# Patient Record
Sex: Female | Born: 1985 | Hispanic: Yes | Marital: Single | Smoking: Former smoker
Health system: Southern US, Community
[De-identification: ages and names within clinical notes are randomized; demographics above are authoritative.]

## PROBLEM LIST (undated history)

## (undated) ENCOUNTER — Inpatient Hospital Stay (HOSPITAL_COMMUNITY): Payer: Self-pay

## (undated) DIAGNOSIS — F329 Major depressive disorder, single episode, unspecified: Secondary | ICD-10-CM

## (undated) DIAGNOSIS — K089 Disorder of teeth and supporting structures, unspecified: Secondary | ICD-10-CM

## (undated) DIAGNOSIS — O24419 Gestational diabetes mellitus in pregnancy, unspecified control: Secondary | ICD-10-CM

## (undated) DIAGNOSIS — E119 Type 2 diabetes mellitus without complications: Secondary | ICD-10-CM

## (undated) DIAGNOSIS — F32A Depression, unspecified: Secondary | ICD-10-CM

## (undated) HISTORY — DX: Depression, unspecified: F32.A

## (undated) HISTORY — PX: NO PAST SURGERIES: SHX2092

## (undated) HISTORY — DX: Major depressive disorder, single episode, unspecified: F32.9

---

## 2006-09-29 ENCOUNTER — Encounter (INDEPENDENT_AMBULATORY_CARE_PROVIDER_SITE_OTHER): Payer: Self-pay | Admitting: Nurse Practitioner

## 2006-09-29 LAB — CONVERTED CEMR LAB: Pap Smear: NORMAL

## 2006-10-17 ENCOUNTER — Ambulatory Visit: Payer: Self-pay | Admitting: Internal Medicine

## 2006-10-17 ENCOUNTER — Encounter: Payer: Self-pay | Admitting: Internal Medicine

## 2006-10-17 ENCOUNTER — Encounter (INDEPENDENT_AMBULATORY_CARE_PROVIDER_SITE_OTHER): Payer: Self-pay | Admitting: Nurse Practitioner

## 2006-10-17 ENCOUNTER — Other Ambulatory Visit: Admission: RE | Admit: 2006-10-17 | Discharge: 2006-10-17 | Payer: Self-pay | Admitting: Internal Medicine

## 2006-10-17 LAB — CONVERTED CEMR LAB: Pap Smear: NEGATIVE

## 2006-10-18 ENCOUNTER — Encounter (INDEPENDENT_AMBULATORY_CARE_PROVIDER_SITE_OTHER): Payer: Self-pay | Admitting: Nurse Practitioner

## 2006-10-18 LAB — CONVERTED CEMR LAB
Chlamydia, DNA Probe: NEGATIVE
GC Probe Amp, Genital: NEGATIVE

## 2007-01-28 ENCOUNTER — Ambulatory Visit: Payer: Self-pay | Admitting: Internal Medicine

## 2007-01-28 ENCOUNTER — Encounter (INDEPENDENT_AMBULATORY_CARE_PROVIDER_SITE_OTHER): Payer: Self-pay | Admitting: Nurse Practitioner

## 2007-01-28 LAB — CONVERTED CEMR LAB
Bilirubin Urine: NEGATIVE
Ketones, ur: NEGATIVE mg/dL
Specific Gravity, Urine: 1.015
Urobilinogen, UA: 0.2

## 2007-02-26 ENCOUNTER — Ambulatory Visit: Payer: Self-pay | Admitting: Internal Medicine

## 2007-05-31 ENCOUNTER — Encounter (INDEPENDENT_AMBULATORY_CARE_PROVIDER_SITE_OTHER): Payer: Self-pay | Admitting: Nurse Practitioner

## 2007-05-31 DIAGNOSIS — J309 Allergic rhinitis, unspecified: Secondary | ICD-10-CM | POA: Insufficient documentation

## 2007-05-31 DIAGNOSIS — R109 Unspecified abdominal pain: Secondary | ICD-10-CM | POA: Insufficient documentation

## 2008-08-04 ENCOUNTER — Emergency Department (HOSPITAL_COMMUNITY): Admission: EM | Admit: 2008-08-04 | Discharge: 2008-08-04 | Payer: Self-pay | Admitting: Emergency Medicine

## 2008-08-05 ENCOUNTER — Inpatient Hospital Stay (HOSPITAL_COMMUNITY): Admission: AD | Admit: 2008-08-05 | Discharge: 2008-08-05 | Payer: Self-pay | Admitting: Obstetrics & Gynecology

## 2008-08-06 ENCOUNTER — Inpatient Hospital Stay (HOSPITAL_COMMUNITY): Admission: AD | Admit: 2008-08-06 | Discharge: 2008-08-06 | Payer: Self-pay | Admitting: Family Medicine

## 2008-09-02 ENCOUNTER — Ambulatory Visit: Payer: Self-pay | Admitting: Obstetrics and Gynecology

## 2008-09-02 ENCOUNTER — Inpatient Hospital Stay (HOSPITAL_COMMUNITY): Admission: AD | Admit: 2008-09-02 | Discharge: 2008-09-02 | Payer: Self-pay | Admitting: Obstetrics & Gynecology

## 2009-03-16 ENCOUNTER — Emergency Department (HOSPITAL_COMMUNITY): Admission: EM | Admit: 2009-03-16 | Discharge: 2009-03-17 | Payer: Self-pay | Admitting: Emergency Medicine

## 2009-03-17 ENCOUNTER — Inpatient Hospital Stay (HOSPITAL_COMMUNITY): Admission: RE | Admit: 2009-03-17 | Discharge: 2009-03-18 | Payer: Self-pay | Admitting: Psychiatry

## 2009-03-17 ENCOUNTER — Ambulatory Visit: Payer: Self-pay | Admitting: Psychiatry

## 2009-09-11 ENCOUNTER — Emergency Department (HOSPITAL_COMMUNITY): Admission: EM | Admit: 2009-09-11 | Discharge: 2009-09-11 | Payer: Self-pay | Admitting: Emergency Medicine

## 2009-11-06 ENCOUNTER — Inpatient Hospital Stay (HOSPITAL_COMMUNITY): Admission: AD | Admit: 2009-11-06 | Discharge: 2009-11-06 | Payer: Self-pay | Admitting: Obstetrics & Gynecology

## 2009-11-06 ENCOUNTER — Inpatient Hospital Stay (HOSPITAL_COMMUNITY): Admission: AD | Admit: 2009-11-06 | Discharge: 2009-11-07 | Payer: Self-pay | Admitting: Obstetrics & Gynecology

## 2009-11-06 ENCOUNTER — Ambulatory Visit: Payer: Self-pay | Admitting: Family

## 2009-11-08 ENCOUNTER — Inpatient Hospital Stay (HOSPITAL_COMMUNITY): Admission: AD | Admit: 2009-11-08 | Discharge: 2009-11-08 | Payer: Self-pay | Admitting: Obstetrics and Gynecology

## 2009-12-09 ENCOUNTER — Ambulatory Visit: Payer: Self-pay | Admitting: Obstetrics & Gynecology

## 2010-03-11 ENCOUNTER — Inpatient Hospital Stay (HOSPITAL_COMMUNITY)
Admission: AD | Admit: 2010-03-11 | Discharge: 2010-03-11 | Payer: Self-pay | Source: Home / Self Care | Admitting: Obstetrics & Gynecology

## 2010-03-22 ENCOUNTER — Ambulatory Visit: Payer: Self-pay | Admitting: Nurse Practitioner

## 2010-03-22 ENCOUNTER — Inpatient Hospital Stay (HOSPITAL_COMMUNITY)
Admission: AD | Admit: 2010-03-22 | Discharge: 2010-03-22 | Payer: Self-pay | Source: Home / Self Care | Admitting: Obstetrics & Gynecology

## 2010-04-18 ENCOUNTER — Emergency Department (HOSPITAL_COMMUNITY)
Admission: EM | Admit: 2010-04-18 | Discharge: 2010-04-19 | Payer: Self-pay | Source: Home / Self Care | Admitting: Emergency Medicine

## 2010-08-21 ENCOUNTER — Encounter: Payer: Self-pay | Admitting: Obstetrics & Gynecology

## 2010-10-13 LAB — DIFFERENTIAL
Eosinophils Absolute: 0.3 10*3/uL (ref 0.0–0.7)
Eosinophils Relative: 2 % (ref 0–5)
Lymphocytes Relative: 30 % (ref 12–46)
Monocytes Absolute: 1 10*3/uL (ref 0.1–1.0)
Monocytes Relative: 8 % (ref 3–12)
Neutro Abs: 7.2 10*3/uL (ref 1.7–7.7)
Neutrophils Relative %: 60 % (ref 43–77)

## 2010-10-13 LAB — COMPREHENSIVE METABOLIC PANEL
ALT: 16 U/L (ref 0–35)
AST: 21 U/L (ref 0–37)
Alkaline Phosphatase: 55 U/L (ref 39–117)
Creatinine, Ser: 0.51 mg/dL (ref 0.4–1.2)
GFR calc non Af Amer: >60 mL/min (ref >60)
Total Bilirubin: 0.8 mg/dL (ref 0.3–1.2)

## 2010-10-13 LAB — CBC
MCH: 27.3 pg (ref 26.0–34.0)
MCHC: 33.5 g/dL (ref 30.0–36.0)
MCV: 81.6 fL (ref 78.0–100.0)
Platelets: 320 10*3/uL (ref 150–400)
RBC: 4.47 MIL/uL (ref 3.87–5.11)
RDW: 14.4 % (ref 11.5–15.5)

## 2010-10-13 LAB — URINALYSIS, ROUTINE W REFLEX MICROSCOPIC: Ketones, ur: NEGATIVE mg/dL

## 2010-10-14 LAB — CBC
MCH: 27.5 pg (ref 26.0–34.0)
MCHC: 33 g/dL (ref 30.0–36.0)
RDW: 14.1 % (ref 11.5–15.5)

## 2010-10-14 LAB — ABO/RH: ABO/RH(D): O POS

## 2010-10-14 LAB — POCT PREGNANCY, URINE: Preg Test, Ur: POSITIVE

## 2010-10-14 LAB — GC/CHLAMYDIA PROBE AMP, GENITAL: Chlamydia, DNA Probe: NEGATIVE

## 2010-10-14 LAB — HCG, QUANTITATIVE, PREGNANCY: hCG, Beta Chain, Quant, S: 16485 m[IU]/mL — ABNORMAL HIGH (ref <5)

## 2010-10-19 LAB — ABO/RH: ABO/RH(D): O POS

## 2010-10-19 LAB — CBC
HCT: 34.4 % — ABNORMAL LOW (ref 36.0–46.0)
Hemoglobin: 11.4 g/dL — ABNORMAL LOW (ref 12.0–15.0)
Hemoglobin: 12.1 g/dL (ref 12.0–15.0)
MCHC: 32.8 g/dL (ref 30.0–36.0)
MCV: 82.2 fL (ref 78.0–100.0)
MCV: 82.3 fL (ref 78.0–100.0)
Platelets: 318 10*3/uL (ref 150–400)
RBC: 4.17 MIL/uL (ref 3.87–5.11)
RBC: 4.53 MIL/uL (ref 3.87–5.11)
RDW: 14.6 % (ref 11.5–15.5)
RDW: 14.6 % (ref 11.5–15.5)
WBC: 12.3 10*3/uL — ABNORMAL HIGH (ref 4.0–10.5)
WBC: 9.6 10*3/uL (ref 4.0–10.5)

## 2010-10-19 LAB — URINE MICROSCOPIC-ADD ON

## 2010-10-19 LAB — URINALYSIS, ROUTINE W REFLEX MICROSCOPIC
Bilirubin Urine: NEGATIVE
Ketones, ur: NEGATIVE mg/dL
Nitrite: NEGATIVE
Specific Gravity, Urine: <1.005 — ABNORMAL LOW (ref 1.005–1.030)
pH: 6 (ref 5.0–8.0)

## 2010-10-19 LAB — HCG, QUANTITATIVE, PREGNANCY
hCG, Beta Chain, Quant, S: 7015 m[IU]/mL — ABNORMAL HIGH (ref <5)
hCG, Beta Chain, Quant, S: 7126 m[IU]/mL — ABNORMAL HIGH (ref <5)

## 2010-10-19 LAB — GC/CHLAMYDIA PROBE AMP, GENITAL: GC Probe Amp, Genital: NEGATIVE

## 2010-10-19 LAB — POCT PREGNANCY, URINE: Preg Test, Ur: POSITIVE

## 2010-11-05 LAB — ACETAMINOPHEN LEVEL: Acetaminophen (Tylenol), Serum: <10 ug/mL — ABNORMAL LOW (ref 10–30)

## 2010-11-05 LAB — DIFFERENTIAL
Basophils Absolute: 0 10*3/uL (ref 0.0–0.1)
Basophils Relative: 0 % (ref 0–1)
Eosinophils Relative: 1 % (ref 0–5)
Monocytes Absolute: 0.8 10*3/uL (ref 0.1–1.0)
Neutro Abs: 10.2 10*3/uL — ABNORMAL HIGH (ref 1.7–7.7)

## 2010-11-05 LAB — CBC
HCT: 39.1 % (ref 36.0–46.0)
Hemoglobin: 12.9 g/dL (ref 12.0–15.0)
MCV: 81.8 fL (ref 78.0–100.0)
Platelets: 291 10*3/uL (ref 150–400)
RBC: 4.78 MIL/uL (ref 3.87–5.11)
WBC: 13.2 10*3/uL — ABNORMAL HIGH (ref 4.0–10.5)

## 2010-11-05 LAB — COMPREHENSIVE METABOLIC PANEL
Albumin: 3.7 g/dL (ref 3.5–5.2)
Alkaline Phosphatase: 83 U/L (ref 39–117)
BUN: 5 mg/dL — ABNORMAL LOW (ref 6–23)
CO2: 24 mEq/L (ref 19–32)
Chloride: 109 mEq/L (ref 96–112)
Creatinine, Ser: 0.56 mg/dL (ref 0.4–1.2)
GFR calc non Af Amer: >60 mL/min (ref >60)
Potassium: 3.6 mEq/L (ref 3.5–5.1)
Total Bilirubin: 0.4 mg/dL (ref 0.3–1.2)

## 2010-11-05 LAB — PREGNANCY, URINE: Preg Test, Ur: NEGATIVE

## 2010-11-05 LAB — SALICYLATE LEVEL: Salicylate Lvl: <4 mg/dL (ref 2.8–20.0)

## 2010-11-05 LAB — RAPID URINE DRUG SCREEN, HOSP PERFORMED
Opiates: POSITIVE — AB
Tetrahydrocannabinol: NOT DETECTED

## 2010-11-14 LAB — POCT I-STAT, CHEM 8
BUN: 3 mg/dL — ABNORMAL LOW (ref 6–23)
Chloride: 106 mEq/L (ref 96–112)
Creatinine, Ser: 0.7 mg/dL (ref 0.4–1.2)
Potassium: 3.7 mEq/L (ref 3.5–5.1)
Sodium: 139 mEq/L (ref 135–145)

## 2010-11-14 LAB — URINE MICROSCOPIC-ADD ON

## 2010-11-14 LAB — CBC
Hemoglobin: 12.7 g/dL (ref 12.0–15.0)
MCHC: 32.6 g/dL (ref 30.0–36.0)
RBC: 4.8 MIL/uL (ref 3.87–5.11)
WBC: 10.2 10*3/uL (ref 4.0–10.5)

## 2010-11-14 LAB — URINALYSIS, ROUTINE W REFLEX MICROSCOPIC
Ketones, ur: NEGATIVE mg/dL
Leukocytes, UA: NEGATIVE
Nitrite: NEGATIVE
Protein, ur: NEGATIVE mg/dL

## 2010-11-14 LAB — POCT PREGNANCY, URINE: Preg Test, Ur: POSITIVE

## 2010-11-14 LAB — ABO/RH: ABO/RH(D): O POS

## 2010-11-15 LAB — URINALYSIS, ROUTINE W REFLEX MICROSCOPIC
Glucose, UA: NEGATIVE mg/dL
Hgb urine dipstick: NEGATIVE
Leukocytes, UA: NEGATIVE
Specific Gravity, Urine: 1.025 (ref 1.005–1.030)
pH: 6.5 (ref 5.0–8.0)

## 2010-11-15 LAB — URINE MICROSCOPIC-ADD ON

## 2010-12-13 NOTE — H&P (Signed)
Tracy [name], Tracy [name]    ACCOUNT NO.:  [id number]   MEDICAL RECORD NO.:  [id number]         PATIENT TYPE:  BIPS   LOCATION:  502                           FACILITY:  [facility]   PHYSICIAN:  Vic Ripper, P.A.-C.DATE OF BIRTH:  12-Nov-1985   DATE OF ADMISSION:  03/17/2009  DATE OF DISCHARGE:                       PSYCHIATRIC ADMISSION ASSESSMENT   IDENTIFICATION:  [name].  She  states that she suffered a miscarriage about seven months ago.  They  gave her a baby blanket from the [facility] after she miscarried  and every time she sees the baby's blanket she feels depressed.  Yesterday she wanted to let her anger out and she took a razor that you  cut your eyebrows with or something and she was just intending to  superficially cut her forearm to left that anger out about the  miscarriage and instead she went deeper than she realized.  Her self-  inflicted superficial laceration required 12 sutures.  She states she  has learned her lesson.  She will never, ever cut again.   PAST PSYCHIATRIC HISTORY:  She denies any   SOCIAL HISTORY:  She went to the 11th grade.  She has been with this  boyfriend for three years.  She is not employed.   FAMILY HISTORY:  She denies.   ALCOHOL AND DRUG HISTORY:  She denies.   PRIMARY CARE PHYSICIAN:  She does not have one.  Her prenatal care was  through [facility].   PAST MEDICAL HISTORY:  Medical problems:  None are known.   MEDICATIONS:  None are prescribed and she is refusing any.   ALLERGIES:  No known drug allergies.   POSITIVE PHYSICAL FINDINGS:  She does have sutures intact to her  interior left forearm from a self-inflicted laceration.  They are clean  and dry.  VITAL SIGNS:  She was afebrile.  Her temperature ranged from 97.3-98.8.  Her respirations from 16-26.  Her pulse ranged from 81-138 and her blood  pressure was from 97/68 to 132/89.  GENERAL:  She appears to be a  well-developed, well-nourished female who  appears her stated age.  Her laboratory findings showed her WBC was  slightly elevated at 13.2.  Her chemistries basically were within normal  limits.  Her UDS was positive for opiates.  However, they stated they  had given her pain killers because of her laceration before they  sutured.   MENTAL STATUS EXAM:  Today she is alert and oriented.  She is  appropriately groomed, dressed, and nourished.  Her speech was not  pressured.  Her mood was calm.  Her affect was appropriate to the  situation.  She had a normal range.  Thought processes are clear,  rational, and goal-oriented.  She reported that the plan was to be  discharged in the morning to her mother-in-law as her own mother does  not drive.  Judgment and insight seemed to be intact.  Concentration and  memory are intact.  Intelligence is average.  She denied being suicidal  or homicidal.  She denied auditory or visual hallucinations.   DIAGNOSES:  AXIS I:  1. Postpartum depression.  2.  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  AXIS II:  Deferred.  AXIS III:  Self-inflicted laceration, left forearm, requiring 12  stitches.  AXIS IV:  Severe.  Economic, education, housing, and educational issues.  AXIS V:  55.  She denies the need to take any antidepressant for her  postpartum depression.  Her plan is to  give the baby blanket to her  mother so she is no longer triggered and she reports she knows how to  ask for help if she continues to feel depressed.      Vic Ripper, P.A.-C.     MD/MEDQ  D:  03/17/2009  T:  03/18/2009  Job:  [id number]

## 2010-12-16 NOTE — Discharge Summary (Signed)
Tracy [name], RAYBORN    ACCOUNT NO.:  [id number]   MEDICAL RECORD NO.:  [id number]          PATIENT TYPE:  IPS   LOCATION:  0502                          FACILITY:  [facility]   PHYSICIAN:  Jasmine Pang, M.D. DATE OF BIRTH:  08-06-1985   DATE OF ADMISSION:  03/17/2009  DATE OF DISCHARGE:  03/18/2009                               DISCHARGE SUMMARY   IDENTIFICATION:  [name] who was  admitted on a voluntary basis on March 17, 2009.   HISTORY OF PRESENT ILLNESS:  The patient stated she suffered a  miscarriage about 7 months ago.  [facility] gave her baby blanket  after she miscarried and every time she sees the baby's blanket she  feels depressed.  Yesterday, she wanted to let her anger out and she  took a razor cut her eyebrows with 2 superficial cuts on her forearm.  She states she was doing this to let the anger out about the  miscarriage.  Instead, she went deeper than she realized.  Her self-  inflicted superficial laceration required 12 sutures.  She states she  has learned her lesson, she will never, ever cut again.  She denies any  past psychiatric history.  She denies any history of drug or alcohol  use.  Initially, she was given an Axis I diagnosis of depressive  disorder NOS (postpartum depression).  On Axis III, she was given a  diagnosis of self-inflicted laceration of the left forearm requiring 12  stitches.   PHYSICAL FINDINGS:  There were no acute physical or medical problems  noted.  The patient does have sutures intact to her interior left  forearm from the self-inflicted laceration.  The patient was fully  evaluated at the ED prior to admission here.   DIAGNOSTIC STUDIES:  The patient's laboratory findings showed her WBC  was elevated at 13.2.  Her chemistries were basically within normal  limits.  Her UDS was positive for opiates.  However, they stated they  gave her pain killers because of a laceration before  they sutured.   HOSPITAL COURSE:  Upon admission, the patient was ordered Ambien 10 mg  p.o. q.h.s. p.r.n. insomnia.  She did not want to use any medications.  However, she stated she felt silly for having cut herself and she would  never do this again.  She does not want to be on medications and does  not want to be in therapy.  She states she has a supportive family and  boyfriend's family.  She denies any history of suicidal ideation and on  March 18, 2009, she wanted to go home.  Her mental status was  remarkable for casually dressed female with good eye contact.  Psychomotor behavior was normal.  Speech was normal rate and flow.  She  was alert and oriented x4.  Mood was less depressed and anxious than  upon admission to the emergency room.  Affect was consistent with mood.  There was no suicidal or homicidal ideation.  No thoughts of self-  injurious behavior.  No auditory or visual hallucinations.  No paranoia  or delusions.  Thoughts were logical and goal-directed.  Thought content  no predominant theme.  The patient was taken home by her family and she  said were really supportive including her boyfriend's family.  She did  agree to let us schedule her at the [facility] for followup  psychiatric treatment, even though she was not on any medications.  Her  plan is to give the baby blanker to her mother so that she is no longer  triggered and she reports that she knows how to ask for help if she  continues to feel depressed.   DISCHARGE DIAGNOSES:  Axis I:  Depressive disorder, not otherwise  specified.  Axis II:  None.  Axis III:  Self-inflicted laceration left forearm.  Axis IV:  Severe (economic, educational, housing issues, burden of  psychiatric illness, and burden of recent loss of her baby).  Axis V:  Global assessment of functioning was 55 upon discharge.  Global  assessment of functioning was 45 upon admission.  Global assessment of  functioning highest past year  was 65.   DISCHARGE PLANS:  There were no specific activity level or dietary  restrictions.   POST HOSPITAL CARE PLANS:  The patient will go to family services for  counseling on March 26, 2009, at 1:30 p.m.  She will also go to the  [facility] on Monday, March 22, 2009, at 9:30 a.m.   DISCHARGE MEDICATIONS:  None.      Jasmine Pang, M.D.  Electronically Signed     BHS/MEDQ  D:  03/29/2009  T:  03/30/2009  Job:  [id number]

## 2012-07-21 ENCOUNTER — Inpatient Hospital Stay (HOSPITAL_COMMUNITY)
Admission: AD | Admit: 2012-07-21 | Discharge: 2012-07-21 | Disposition: A | Discharge status: Home / Self Care | Payer: Medicaid Other | Source: Ambulatory Visit | Attending: Obstetrics & Gynecology | Admitting: Obstetrics & Gynecology

## 2012-07-21 ENCOUNTER — Encounter (HOSPITAL_COMMUNITY): Payer: Self-pay | Admitting: Family

## 2012-07-21 DIAGNOSIS — O2390 Unspecified genitourinary tract infection in pregnancy, unspecified trimester: Secondary | ICD-10-CM | POA: Insufficient documentation

## 2012-07-21 DIAGNOSIS — A499 Bacterial infection, unspecified: Secondary | ICD-10-CM | POA: Insufficient documentation

## 2012-07-21 DIAGNOSIS — B9689 Other specified bacterial agents as the cause of diseases classified elsewhere: Secondary | ICD-10-CM | POA: Insufficient documentation

## 2012-07-21 DIAGNOSIS — N760 Acute vaginitis: Secondary | ICD-10-CM | POA: Insufficient documentation

## 2012-07-21 DIAGNOSIS — N390 Urinary tract infection, site not specified: Secondary | ICD-10-CM | POA: Insufficient documentation

## 2012-07-21 DIAGNOSIS — R1031 Right lower quadrant pain: Secondary | ICD-10-CM | POA: Insufficient documentation

## 2012-07-21 DIAGNOSIS — O2340 Unspecified infection of urinary tract in pregnancy, unspecified trimester: Secondary | ICD-10-CM

## 2012-07-21 LAB — URINALYSIS, ROUTINE W REFLEX MICROSCOPIC
Glucose, UA: NEGATIVE mg/dL
Leukocytes, UA: NEGATIVE
Nitrite: POSITIVE — AB
Protein, ur: NEGATIVE mg/dL
pH: 6 (ref 5.0–8.0)

## 2012-07-21 LAB — URINE MICROSCOPIC-ADD ON

## 2012-07-21 LAB — POCT PREGNANCY, URINE: Preg Test, Ur: POSITIVE — AB

## 2012-07-21 LAB — WET PREP, GENITAL

## 2012-07-21 MED ORDER — NITROFURANTOIN MONOHYD MACRO 100 MG PO CAPS: 100.0000 mg | ORAL_CAPSULE | Freq: Two times a day (BID) | ORAL | Status: DC

## 2012-07-21 MED ORDER — METRONIDAZOLE 500 MG PO TABS: 500.0000 mg | ORAL_TABLET | Freq: Two times a day (BID) | ORAL | Status: DC

## 2012-07-21 NOTE — MAU Provider Note (Signed)
History     CSN: [id number]  Arrival date and time: 07/21/12 2035   First Provider Initiated Contact with Patient 07/21/12 2112      Chief Complaint  Patient presents with  . Back Pain   HPI This is a 26 y.o. female at [redacted]w[redacted]d who presents for evaluation of RLQ pain which radiates to back, started today.  Concerned about baby due to previous miscarriages. Never got to heartbeat stage with any of them. Denies bleeding. Saw baby on [place] at [facility].   RN Note: Patient reports feeling RLQ pain radiating to back since 1800 today; denies vaginal bleeding or discharge. LMP 05/09/12. Denies heavy lifting, strenuous activity, or recent sexual intercourse.  Patient reports she had an [place] on 12/13 at a clinic on [address]. Patient is concerned because of hx of 3 miscarriages.   OB History    Grav Para Term Preterm Abortions TAB SAB Ect Mult Living   4    3  3          Past Medical History  Diagnosis Date  . No pertinent past medical history     Past Surgical History  Procedure Date  . No past surgeries     History reviewed. No pertinent family history.  History  Substance Use Topics  . Smoking status: Never Smoker   . Smokeless tobacco: Never Used  . Alcohol Use: No    Allergies: No Known Allergies  Prescriptions prior to admission  Medication Sig Dispense Refill  . Prenatal Vit-Fe Fumarate-FA (PRENATAL MULTIVITAMIN) TABS Take 1 tablet by mouth daily.        ROS See HPI  Physical Exam   Blood pressure 117/73, pulse 103, temperature 97.2 F (36.2 C), temperature source Oral, resp. rate 16, height 4\' 11"  (1.499 m), weight 133 lb (60.328 kg), last menstrual period 05/09/2012.  Physical Exam  Constitutional: She is oriented to person, place, and time. She appears well-developed and well-nourished. No distress.  HENT:  Head: Normocephalic.  Cardiovascular: Normal rate.   Respiratory: Effort normal.  GI: Soft. She exhibits no distension and no  mass. There is tenderness (slight over RLQ). There is no rebound and no guarding.  Genitourinary: Vagina normal and uterus normal. No vaginal discharge found.  Musculoskeletal: Normal range of motion.  Neurological: She is alert and oriented to person, place, and time.  Skin: Skin is warm and dry.  Psychiatric: She has a normal mood and affect.   Results for orders placed during the hospital encounter of 07/21/12 (from the past 24 hour(s))  URINALYSIS, ROUTINE W REFLEX MICROSCOPIC     Status: Abnormal   Collection Time   07/21/12  8:43 PM      Component Value Range   Color, Urine YELLOW  YELLOW   APPearance CLOUDY (*) CLEAR   Specific Gravity, Urine 1.025  1.005 - 1.030   pH 6.0  5.0 - 8.0   Glucose, UA NEGATIVE  NEGATIVE mg/dL   Hgb urine dipstick NEGATIVE  NEGATIVE   Bilirubin Urine NEGATIVE  NEGATIVE   Ketones, ur 15 (*) NEGATIVE mg/dL   Protein, ur NEGATIVE  NEGATIVE mg/dL   Urobilinogen, UA 0.2  0.0 - 1.0 mg/dL   Nitrite POSITIVE (*) NEGATIVE   Leukocytes, UA NEGATIVE  NEGATIVE  URINE MICROSCOPIC-ADD ON     Status: Normal   Collection Time   07/21/12  8:43 PM      Component Value Range   Squamous Epithelial / LPF RARE  RARE   WBC,  UA 0-2  <3 WBC/hpf   RBC / HPF 0-2  <3 RBC/hpf   Bacteria, UA RARE  RARE   Urine-Other MUCOUS PRESENT    POCT PREGNANCY, URINE     Status: Abnormal   Collection Time   07/21/12  9:06 PM      Component Value Range   Preg Test, Ur POSITIVE (*) NEGATIVE  WET PREP, GENITAL     Status: Abnormal   Collection Time   07/21/12  9:25 PM      Component Value Range   Yeast Wet Prep HPF POC FEW (*) NONE SEEN   Trich, Wet Prep NONE SEEN  NONE SEEN   Clue Cells Wet Prep HPF POC MODERATE (*) NONE SEEN   WBC, Wet Prep HPF POC MODERATE (*) NONE SEEN    MAU Course  Procedures  Bedside US done to check FHR which was 160s. Good fetal movement.  Assessment and Plan  A:  SIUP at [redacted]w[redacted]d       UTI      BV  P:  Discharge       Reassured baby is well for  now       Rx Macrobid and Flagyl       F/U with prenatal care (may go to Dr Gaynell Face)  Wynelle Bourgeois 07/21/2012, 10:21 PM

## 2012-07-21 NOTE — MAU Note (Addendum)
Patient reports feeling RLQ pain radiating to back since 1800 today; denies vaginal bleeding or discharge. LMP 05/09/12. Denies heavy lifting, strenuous activity, or recent sexual intercourse.   Patient reports she had an [place] on 12/13 at a clinic on [place].  Patient is concerned because of hx of 3 miscarriages.

## 2012-07-31 NOTE — L&D Delivery Note (Signed)
Delivery Note At 1:36 AM a viable female was delivered via Vaginal, Spontaneous Delivery (Presentation: Middle Occiput Anterior).  Compound presentation with arm.  APGAR: 6, 9; weight .   Placenta status: Intact, Spontaneous.  Cord:  with the following complications: tight nuchal cord x 1 clamped/cut prior to delivery of the shoulders.    Anesthesia: Local Epidural  Episiotomy: None Lacerations: 2nd degree Suture Repair: 2.0 vicryl rapide Est. Blood Loss (mL): 150 ml  Mom to postpartum.  Baby to nursery-stable.  JACKSON-MOORE,Cyara Devoto A 02/20/2013, 2:01 AM

## 2012-08-15 ENCOUNTER — Encounter (HOSPITAL_COMMUNITY): Payer: Self-pay

## 2012-08-15 ENCOUNTER — Inpatient Hospital Stay (HOSPITAL_COMMUNITY)
Admission: AD | Admit: 2012-08-15 | Discharge: 2012-08-15 | Disposition: A | Discharge status: Home / Self Care | Payer: Medicaid Other | Source: Ambulatory Visit | Attending: Obstetrics and Gynecology | Admitting: Obstetrics and Gynecology

## 2012-08-15 DIAGNOSIS — O0930 Supervision of pregnancy with insufficient antenatal care, unspecified trimester: Secondary | ICD-10-CM

## 2012-08-15 DIAGNOSIS — M549 Dorsalgia, unspecified: Secondary | ICD-10-CM | POA: Insufficient documentation

## 2012-08-15 DIAGNOSIS — O9989 Other specified diseases and conditions complicating pregnancy, childbirth and the puerperium: Secondary | ICD-10-CM

## 2012-08-15 DIAGNOSIS — O365931 Maternal care for other known or suspected poor fetal growth, third trimester, fetus 1: Secondary | ICD-10-CM

## 2012-08-15 DIAGNOSIS — O99891 Other specified diseases and conditions complicating pregnancy: Secondary | ICD-10-CM | POA: Insufficient documentation

## 2012-08-15 LAB — URINALYSIS, ROUTINE W REFLEX MICROSCOPIC
Bilirubin Urine: NEGATIVE
Glucose, UA: NEGATIVE mg/dL
Hgb urine dipstick: NEGATIVE
Specific Gravity, Urine: <1.005 — ABNORMAL LOW (ref 1.005–1.030)
Urobilinogen, UA: 0.2 mg/dL (ref 0.0–1.0)

## 2012-08-15 MED ORDER — ACETAMINOPHEN 325 MG PO TABS
650.0000 mg | ORAL_TABLET | Freq: Four times a day (QID) | ORAL | Status: DC | PRN
  Administered 2012-08-15: 650 mg via ORAL
  Filled 2012-08-15: qty 2

## 2012-08-15 MED ORDER — CYCLOBENZAPRINE HCL 10 MG PO TABS
10.0000 mg | ORAL_TABLET | Freq: Three times a day (TID) | ORAL | Status: DC | PRN
  Administered 2012-08-15: 10 mg via ORAL
  Filled 2012-08-15: qty 1

## 2012-08-15 MED ORDER — CYCLOBENZAPRINE HCL 10 MG PO TABS: 10.0000 mg | ORAL_TABLET | Freq: Three times a day (TID) | ORAL | Status: DC | PRN

## 2012-08-15 NOTE — MAU Provider Note (Signed)
History     CSN: [id number]  Arrival date and time: 08/15/12 1538   None     Chief Complaint  Patient presents with  . Back Pain   HPI 27 y.o. G4P0030 at [redacted]w[redacted]d by LMP with R upper back pain x 1 day. Woke her up this morning. Pain is sharp, comes and goes, worse lying down, hard to sleep. No known injury or trauma. No fever/chills. No nausea/vomiting. No dysuria. Took tylenol (325 mg), didn't help. No bleeding or cramping. Plans to see Dr. Gaynell Face, but Medicaid not approved yet. Filed last month.  LMP:  05/09/12.  Regular, sure of dates.   OB History    Grav Para Term Preterm Abortions TAB SAB Ect Mult Living   4    3  3       3  miscarriages, all early (2 months)  Past Medical History  Diagnosis Date  . No pertinent past medical history     Past Surgical History  Procedure Date  . No past surgeries     History reviewed. No pertinent family history.  History  Substance Use Topics  . Smoking status: Never Smoker (former, 3 cig/day x 4 mos)  . Smokeless tobacco: Never Used  . Alcohol Use: No    Allergies: No Known Allergies  Prescriptions prior to admission  Medication Sig Dispense Refill  . acetaminophen (TYLENOL) 325 MG tablet Take 650 mg by mouth every 6 (six) hours as needed. Stomach pain      . metroNIDAZOLE (FLAGYL) 500 MG tablet Take 1 tablet (500 mg total) by mouth 2 (two) times daily.  14 tablet  0  . nitrofurantoin, macrocrystal-monohydrate, (MACROBID) 100 MG capsule Take 1 capsule (100 mg total) by mouth 2 (two) times daily.  14 capsule  0  . Prenatal Vit-Fe Fumarate-FA (PRENATAL MULTIVITAMIN) TABS Take 1 tablet by mouth daily.        Review of Systems  Constitutional: Negative for fever and chills.  Gastrointestinal: Negative for nausea, vomiting and abdominal pain.  Musculoskeletal: Positive for back pain.  Neurological: Negative for dizziness.   Physical Exam   Blood pressure 112/72, pulse 96, temperature 97.8 F (36.6 C), temperature source  Oral, resp. rate 16, height 4\' 11"  (1.499 m), weight 60.895 kg (134 lb 4 oz), last menstrual period 05/09/2012.  Physical Exam  Constitutional: She is oriented to person, place, and time. She appears well-developed and well-nourished. No distress.  HENT:  Head: Normocephalic and atraumatic.  Eyes: Conjunctivae normal and EOM are normal.  Neck: Normal range of motion. Neck supple.  Cardiovascular: Normal rate, regular rhythm and normal heart sounds.   Respiratory: Effort normal and breath sounds normal. No respiratory distress.  GI: Soft. Bowel sounds are normal. There is no tenderness. There is no rebound and no guarding.       No CVA tenderness  Musculoskeletal: Normal range of motion. She exhibits tenderness. She exhibits no edema.       Tender at interior angle of scapula, some muscle tightness.  Neurological: She is alert and oriented to person, place, and time.  Skin: Skin is warm and dry.  Psychiatric: She has a normal mood and affect.   Doppler FHR:  168  MAU Course  Procedures   Assessment and Plan  26 y.o. G4P0030 at [redacted]w[redacted]d with back pain. - Musculoskeletal. Heat/massage, gentle stretching. Tylenol. Flexeril for PM to sleep. - Establish care as soon as Medicaid approved and can see Dr. Gaynell Face. - Ordered detailed sono at 18-20 wks as  outpatient in case still not able to establish care.  Napoleon Form 08/15/2012, 6:05 PM

## 2012-08-15 NOTE — MAU Note (Signed)
Pt states having low back pain and has upper back pain on her right side only. Is concerned because she has had 3 prior miscarriages at 4 and [redacted] wk gestation. Denies abnormal vaginal discharge or bleeding.

## 2012-08-15 NOTE — MAU Note (Signed)
Patient is in with c/o lower back pain and concern about her fetal well being. She will like to have an ultrasound. She denies vaginal bleeding.

## 2012-08-19 NOTE — MAU Provider Note (Signed)
Attestation of Attending Supervision of Advanced Practitioner (CNM/NP): Evaluation and management procedures were performed by the Advanced Practitioner under my supervision and collaboration.  I have reviewed the Advanced Practitioner's note and chart, and I agree with the management and plan.  Britta Louth 08/19/2012 3:19 PM

## 2012-09-12 ENCOUNTER — Inpatient Hospital Stay (HOSPITAL_COMMUNITY)
Admission: AD | Admit: 2012-09-12 | Discharge: 2012-09-12 | Disposition: A | Discharge status: Home / Self Care | Payer: Medicaid Other | Source: Ambulatory Visit | Attending: Family Medicine | Admitting: Family Medicine

## 2012-09-12 ENCOUNTER — Encounter (HOSPITAL_COMMUNITY): Payer: Self-pay | Admitting: *Deleted

## 2012-09-12 DIAGNOSIS — O99891 Other specified diseases and conditions complicating pregnancy: Secondary | ICD-10-CM | POA: Insufficient documentation

## 2012-09-12 DIAGNOSIS — R109 Unspecified abdominal pain: Secondary | ICD-10-CM | POA: Insufficient documentation

## 2012-09-12 DIAGNOSIS — O26899 Other specified pregnancy related conditions, unspecified trimester: Secondary | ICD-10-CM

## 2012-09-12 LAB — WET PREP, GENITAL: Yeast Wet Prep HPF POC: NONE SEEN

## 2012-09-12 LAB — URINALYSIS, ROUTINE W REFLEX MICROSCOPIC
Glucose, UA: NEGATIVE mg/dL
Hgb urine dipstick: NEGATIVE
Leukocytes, UA: NEGATIVE
Protein, ur: NEGATIVE mg/dL
Specific Gravity, Urine: 1.02 (ref 1.005–1.030)
pH: 5.5 (ref 5.0–8.0)

## 2012-09-12 NOTE — MAU Note (Signed)
Pt states she has pain in her lower abd area and in her lower pain-states whens he sits down she feels like she is leaking urine

## 2012-09-12 NOTE — MAU Provider Note (Signed)
Chart reviewed and agree with management and plan.  

## 2012-09-12 NOTE — MAU Provider Note (Signed)
Chief Complaint: Abdominal Pain   First Provider Initiated Contact with Patient 09/12/12 2154     SUBJECTIVE HPI: Tracy [name] is a 27 y.o. G4P0030 at [redacted]w[redacted]d by LMP who presents to maternity admissions reporting lower abdominal cramping, dysuria, and some urgency to urinate after she has urinated and left the bathroom. She reports fetal movement, denies LOF, vaginal bleeding, vaginal itching/burning, urinary symptoms, h/a, dizziness, n/v, or fever/chills.     Past Medical History  Diagnosis Date  . No pertinent past medical history   . Medical history non-contributory    Past Surgical History  Procedure Laterality Date  . No past surgeries     History   Social History  . Marital Status: Single    Spouse Name: N/A    Number of Children: N/A  . Years of Education: N/A   Occupational History  . Not on file.   Social History Main Topics  . Smoking status: Never Smoker   . Smokeless tobacco: Never Used  . Alcohol Use: No  . Drug Use: No  . Sexually Active: Yes    Birth Control/ Protection: None   Other Topics Concern  . Not on file   Social History Narrative  . No narrative on file   No current facility-administered medications on file prior to encounter.   Current Outpatient Prescriptions on File Prior to Encounter  Medication Sig Dispense Refill  . acetaminophen (TYLENOL) 325 MG tablet Take 650 mg by mouth every 6 (six) hours as needed. Stomach pain      . cyclobenzaprine (FLEXERIL) 10 MG tablet Take 1 tablet (10 mg total) by mouth 3 (three) times daily as needed for muscle spasms.  5 tablet  0  . Prenatal Vit-Fe Fumarate-FA (PRENATAL MULTIVITAMIN) TABS Take 1 tablet by mouth daily.       No Known Allergies  ROS: Pertinent items in HPI  OBJECTIVE Blood pressure 121/52, pulse 92, temperature 98.2 F (36.8 C), temperature source Oral, resp. rate 20, height 4\' 11"  (1.499 m), weight 61.859 kg (136 lb 6 oz), last menstrual period 05/09/2012, SpO2  100.00%. GENERAL: Well-developed, well-nourished female in no acute distress.  HEENT: Normocephalic HEART: normal rate RESP: normal effort ABDOMEN: Soft, non-tender, no guarding, no rebound tenderness Neg CVA tenderness EXTREMITIES: Nontender, no edema NEURO: Alert and oriented SPECULUM EXAM: Deferred Cervix 0/long/high, firm, posterior  LAB RESULTS Results for orders placed during the hospital encounter of 09/12/12 (from the past 24 hour(s))  URINALYSIS, ROUTINE W REFLEX MICROSCOPIC     Status: None   Collection Time    09/12/12  8:30 PM      Result Value Range   Color, Urine YELLOW  YELLOW   APPearance CLEAR  CLEAR   Specific Gravity, Urine 1.020  1.005 - 1.030   pH 5.5  5.0 - 8.0   Glucose, UA NEGATIVE  NEGATIVE mg/dL   Hgb urine dipstick NEGATIVE  NEGATIVE   Bilirubin Urine NEGATIVE  NEGATIVE   Ketones, ur NEGATIVE  NEGATIVE mg/dL   Protein, ur NEGATIVE  NEGATIVE mg/dL   Urobilinogen, UA 0.2  0.0 - 1.0 mg/dL   Nitrite NEGATIVE  NEGATIVE   Leukocytes, UA NEGATIVE  NEGATIVE    ASSESSMENT 1. Abdominal pain in pregnancy     PLAN Discharge home Urine sent for culture Rest, warm bath, Tylenol for pain Keep scheduled appointment for U/S next week and prenatal visit with [facility] Return to MAU as needed    Medication List    ASK your doctor about these medications  acetaminophen 325 MG tablet  Commonly known as:  TYLENOL  Take 650 mg by mouth every 6 (six) hours as needed. Stomach pain     cyclobenzaprine 10 MG tablet  Commonly known as:  FLEXERIL  Take 1 tablet (10 mg total) by mouth 3 (three) times daily as needed for muscle spasms.     prenatal multivitamin Tabs  Take 1 tablet by mouth daily.         Sharen Counter Certified Nurse-Midwife 09/12/2012  9:55 PM

## 2012-09-14 LAB — URINE CULTURE
Colony Count: NO GROWTH
Culture: NO GROWTH

## 2012-09-16 ENCOUNTER — Ambulatory Visit (HOSPITAL_COMMUNITY): Payer: Medicaid Other

## 2012-09-19 LAB — OB RESULTS CONSOLE RUBELLA ANTIBODY, IGM: Rubella: NON-IMMUNE/NOT IMMUNE

## 2012-09-19 LAB — OB RESULTS CONSOLE HIV ANTIBODY (ROUTINE TESTING): HIV: NONREACTIVE

## 2012-09-19 LAB — OB RESULTS CONSOLE ABO/RH: RH Type: POSITIVE

## 2012-09-19 LAB — OB RESULTS CONSOLE RPR: RPR: NONREACTIVE

## 2012-09-19 LAB — OB RESULTS CONSOLE HEPATITIS B SURFACE ANTIGEN: Hepatitis B Surface Ag: NEGATIVE

## 2012-09-30 ENCOUNTER — Encounter: Payer: Self-pay | Admitting: *Deleted

## 2012-10-15 ENCOUNTER — Encounter (HOSPITAL_COMMUNITY): Payer: Self-pay | Admitting: *Deleted

## 2012-10-15 ENCOUNTER — Inpatient Hospital Stay (HOSPITAL_COMMUNITY)
Admission: AD | Admit: 2012-10-15 | Discharge: 2012-10-16 | Disposition: A | Discharge status: Home / Self Care | Payer: Medicaid Other | Source: Ambulatory Visit | Attending: Obstetrics | Admitting: Obstetrics

## 2012-10-15 DIAGNOSIS — M549 Dorsalgia, unspecified: Secondary | ICD-10-CM

## 2012-10-15 DIAGNOSIS — M545 Low back pain, unspecified: Secondary | ICD-10-CM | POA: Insufficient documentation

## 2012-10-15 DIAGNOSIS — N949 Unspecified condition associated with female genital organs and menstrual cycle: Secondary | ICD-10-CM

## 2012-10-15 DIAGNOSIS — R300 Dysuria: Secondary | ICD-10-CM | POA: Insufficient documentation

## 2012-10-15 DIAGNOSIS — O2390 Unspecified genitourinary tract infection in pregnancy, unspecified trimester: Secondary | ICD-10-CM | POA: Insufficient documentation

## 2012-10-15 DIAGNOSIS — B9689 Other specified bacterial agents as the cause of diseases classified elsewhere: Secondary | ICD-10-CM | POA: Insufficient documentation

## 2012-10-15 DIAGNOSIS — N760 Acute vaginitis: Secondary | ICD-10-CM

## 2012-10-15 DIAGNOSIS — A499 Bacterial infection, unspecified: Secondary | ICD-10-CM | POA: Insufficient documentation

## 2012-10-15 DIAGNOSIS — R109 Unspecified abdominal pain: Secondary | ICD-10-CM | POA: Insufficient documentation

## 2012-10-15 LAB — URINALYSIS, ROUTINE W REFLEX MICROSCOPIC
Bilirubin Urine: NEGATIVE
Glucose, UA: NEGATIVE mg/dL
Hgb urine dipstick: NEGATIVE
Specific Gravity, Urine: <1.005 — ABNORMAL LOW (ref 1.005–1.030)
pH: 7 (ref 5.0–8.0)

## 2012-10-15 NOTE — MAU Note (Signed)
PT SAYS  SHE SAW SPOTTING  AT 9PM ONLY WHEN SHE WIPED.  BURNS TO URINATE AND BURNS WHEN SHE WALKS.  HAS HX OF UTI. PNC WITH [facility]- SEEN LAST  IN FEB AND HAS APPOINTMENT  ON 3-20.

## 2012-10-15 NOTE — MAU Provider Note (Signed)
History     CSN: [id number]  Arrival date and time: 10/15/12 2251   First Provider Initiated Contact with Patient 10/15/12 2354      No chief complaint on file.  HPI Ms. Tracy [name] is a 27 y.o. G4P0030 at [redacted]w[redacted]d who presents to MAU today with complaint of burning with urination. The patient states that this started at 9pm today. She did not know that she could call the office at night, so she came in for evaluation. She also states a small amount of brownish discharge noted. She feels that she has had increased frequency of urination and some urgency. She denies fever or flank pain. She has some low back pain, lower abdominal discomfort at the midline and neck pain today. She reports good fetal movement.   OB History   Grav Para Term Preterm Abortions TAB SAB Ect Mult Living   4    3  3          Past Medical History  Diagnosis Date  . No pertinent past medical history   . Medical history non-contributory   . Depression     Past Surgical History  Procedure Laterality Date  . No past surgeries      History reviewed. No pertinent family history.  History  Substance Use Topics  . Smoking status: Never Smoker   . Smokeless tobacco: Never Used  . Alcohol Use: No    Allergies: No Known Allergies  Prescriptions prior to admission  Medication Sig Dispense Refill  . acetaminophen (TYLENOL) 325 MG tablet Take 650 mg by mouth every 6 (six) hours as needed. Stomach pain      . Prenatal Vit-Fe Fumarate-FA (PRENATAL MULTIVITAMIN) TABS Take 1 tablet by mouth daily.      . cyclobenzaprine (FLEXERIL) 10 MG tablet Take 1 tablet (10 mg total) by mouth 3 (three) times daily as needed for muscle spasms.  5 tablet  0    Review of Systems  Constitutional: Negative for fever, chills and malaise/fatigue.  HENT: Positive for neck pain.   Gastrointestinal: Positive for abdominal pain and constipation. Negative for nausea, vomiting and diarrhea.  Genitourinary: Positive for  dysuria, urgency and frequency.       + vaginal bleeding Neg - abnormal discharge  Musculoskeletal: Positive for back pain.   Physical Exam   Blood pressure 106/75, pulse 100, temperature 97.9 F (36.6 C), temperature source Oral, resp. rate 20, height 4\' 9"  (1.448 m), weight 145 lb 4 oz (65.885 kg), last menstrual period 05/09/2012.  Physical Exam  Constitutional: She is oriented to person, place, and time. She appears well-developed and well-nourished. No distress.  HENT:  Head: Normocephalic and atraumatic.  Cardiovascular: Normal rate, regular rhythm and normal heart sounds.   Respiratory: Effort normal and breath sounds normal. No respiratory distress.  GI: Soft. Bowel sounds are normal. She exhibits no distension and no mass. There is tenderness (suprapubic tenderness to palpation). There is no rebound and no guarding.  Genitourinary: Vagina normal. Uterus is enlarged (appropriate for GA). Uterus is not tender. Cervix exhibits discharge (small amount of thin mucus white discharge noted. No bleeding). Cervix exhibits no motion tenderness and no friability.  Cervix: Closed, long, thick and posterior  Neurological: She is alert and oriented to person, place, and time.  Skin: Skin is warm and dry. No erythema.  Psychiatric: She has a normal mood and affect.   Results for orders placed during the hospital encounter of 10/15/12 (from the past 24 hour(s))  URINALYSIS, ROUTINE W  REFLEX MICROSCOPIC     Status: Abnormal   Collection Time    10/15/12 10:54 PM      Result Value Range   Color, Urine YELLOW  YELLOW   APPearance CLEAR  CLEAR   Specific Gravity, Urine <1.005 (*) 1.005 - 1.030   pH 7.0  5.0 - 8.0   Glucose, UA NEGATIVE  NEGATIVE mg/dL   Hgb urine dipstick NEGATIVE  NEGATIVE   Bilirubin Urine NEGATIVE  NEGATIVE   Ketones, ur NEGATIVE  NEGATIVE mg/dL   Protein, ur NEGATIVE  NEGATIVE mg/dL   Urobilinogen, UA 0.2  0.0 - 1.0 mg/dL   Nitrite NEGATIVE  NEGATIVE   Leukocytes, UA  NEGATIVE  NEGATIVE  WET PREP, GENITAL     Status: Abnormal   Collection Time    10/16/12 12:10 AM      Result Value Range   Yeast Wet Prep HPF POC NONE SEEN  NONE SEEN   Trich, Wet Prep NONE SEEN  NONE SEEN   Clue Cells Wet Prep HPF POC FEW (*) NONE SEEN   WBC, Wet Prep HPF POC FEW (*) NONE SEEN    MAU Course  Procedures  MDM No bleeding on exam. +FHTs today. Significant improvement in all pain with flexeril and tylenol. Wet prep consistent with bacterial vaginosis. UA negative.  Patient reports known anemia. Has occasional fatigue. No syncope or near syncopal episodes reported. Has follow-up in office on Thursday.   Assessment and Plan  A: Bacterial Vaginosis Round ligament pain  P: Discharge home Rx for flagyl and flexeril sent to patient's pharmacy Patient encouraged to keep follow-up with Dr. Clearance Coots for later this week for regular prenatal care Patient may return to MAU as needed of if her condition were to change or worsen  Freddi Starr, PA-C  10/15/2012, 11:55 PM

## 2012-10-16 ENCOUNTER — Other Ambulatory Visit: Payer: Self-pay | Admitting: *Deleted

## 2012-10-16 DIAGNOSIS — Z3482 Encounter for supervision of other normal pregnancy, second trimester: Secondary | ICD-10-CM

## 2012-10-16 LAB — WET PREP, GENITAL
Trich, Wet Prep: NONE SEEN
Yeast Wet Prep HPF POC: NONE SEEN

## 2012-10-16 LAB — GC/CHLAMYDIA PROBE AMP
CT Probe RNA: NEGATIVE
GC Probe RNA: NEGATIVE

## 2012-10-16 MED ORDER — METRONIDAZOLE 500 MG PO TABS: 500.0000 mg | ORAL_TABLET | Freq: Two times a day (BID) | ORAL | Status: DC

## 2012-10-16 MED ORDER — CYCLOBENZAPRINE HCL 10 MG PO TABS: 10.0000 mg | ORAL_TABLET | Freq: Three times a day (TID) | ORAL | Status: DC | PRN

## 2012-10-16 MED ORDER — CYCLOBENZAPRINE HCL 10 MG PO TABS
10.0000 mg | ORAL_TABLET | Freq: Once | ORAL | Status: AC
  Administered 2012-10-16: 10 mg via ORAL
  Filled 2012-10-16: qty 1

## 2012-10-16 MED ORDER — ACETAMINOPHEN 325 MG PO TABS
650.0000 mg | ORAL_TABLET | Freq: Once | ORAL | Status: AC
  Administered 2012-10-16: 650 mg via ORAL
  Filled 2012-10-16: qty 2

## 2012-10-17 ENCOUNTER — Encounter: Payer: Self-pay | Admitting: Obstetrics

## 2012-10-17 ENCOUNTER — Ambulatory Visit: Payer: Medicaid Other | Admitting: *Deleted

## 2012-10-17 ENCOUNTER — Ambulatory Visit (INDEPENDENT_AMBULATORY_CARE_PROVIDER_SITE_OTHER): Payer: Medicaid Other | Admitting: Obstetrics

## 2012-10-17 VITALS — BP 105/71 | Temp 98.0°F | Wt 144.0 lb

## 2012-10-17 DIAGNOSIS — Z3482 Encounter for supervision of other normal pregnancy, second trimester: Secondary | ICD-10-CM

## 2012-10-17 DIAGNOSIS — Z3400 Encounter for supervision of normal first pregnancy, unspecified trimester: Secondary | ICD-10-CM

## 2012-10-17 DIAGNOSIS — Z3480 Encounter for supervision of other normal pregnancy, unspecified trimester: Secondary | ICD-10-CM

## 2012-10-17 LAB — CBC
HCT: 32 % — ABNORMAL LOW (ref 36.0–46.0)
Hemoglobin: 10.5 g/dL — ABNORMAL LOW (ref 12.0–15.0)
MCHC: 32.8 g/dL (ref 30.0–36.0)
MCV: 80.4 fL (ref 78.0–100.0)
RDW: 15.7 % — ABNORMAL HIGH (ref 11.5–15.5)

## 2012-10-17 NOTE — Progress Notes (Signed)
No complaints. Doing well.

## 2012-10-17 NOTE — Progress Notes (Signed)
Pt states she was having burning with urination last week and went to the hospital. Pt states she is currently being treated for a bacterial infection.

## 2012-10-18 LAB — GLUCOSE TOLERANCE, 2 HOURS W/ 1HR: Glucose, 2 hour: 133 mg/dL (ref 70–139)

## 2012-10-18 LAB — HIV ANTIBODY (ROUTINE TESTING W REFLEX): HIV: NONREACTIVE

## 2012-11-07 ENCOUNTER — Encounter: Payer: Medicaid Other | Admitting: Obstetrics

## 2012-11-21 ENCOUNTER — Encounter: Payer: Self-pay | Admitting: Obstetrics

## 2012-11-21 ENCOUNTER — Ambulatory Visit (INDEPENDENT_AMBULATORY_CARE_PROVIDER_SITE_OTHER): Payer: Medicaid Other | Admitting: Obstetrics

## 2012-11-21 VITALS — BP 105/72 | Temp 97.7°F | Wt 151.0 lb

## 2012-11-21 DIAGNOSIS — Z3403 Encounter for supervision of normal first pregnancy, third trimester: Secondary | ICD-10-CM

## 2012-11-21 DIAGNOSIS — Z3400 Encounter for supervision of normal first pregnancy, unspecified trimester: Secondary | ICD-10-CM

## 2012-11-21 NOTE — Progress Notes (Signed)
Urine specimen not provided by patient

## 2012-11-21 NOTE — Progress Notes (Signed)
Pt states she is having itching all over her body x 3 days. Pt states she is also having cramping in her leg leg and numbness in the left side of her side of her abd.

## 2012-12-05 ENCOUNTER — Ambulatory Visit (INDEPENDENT_AMBULATORY_CARE_PROVIDER_SITE_OTHER): Payer: Medicaid Other | Admitting: Obstetrics

## 2012-12-05 VITALS — BP 112/74 | Temp 98.3°F | Wt 151.0 lb

## 2012-12-05 DIAGNOSIS — O365990 Maternal care for other known or suspected poor fetal growth, unspecified trimester, not applicable or unspecified: Secondary | ICD-10-CM | POA: Insufficient documentation

## 2012-12-05 DIAGNOSIS — Z3403 Encounter for supervision of normal first pregnancy, third trimester: Secondary | ICD-10-CM

## 2012-12-05 DIAGNOSIS — Z3400 Encounter for supervision of normal first pregnancy, unspecified trimester: Secondary | ICD-10-CM

## 2012-12-05 DIAGNOSIS — N390 Urinary tract infection, site not specified: Secondary | ICD-10-CM

## 2012-12-05 DIAGNOSIS — O365991 Maternal care for other known or suspected poor fetal growth, unspecified trimester, fetus 1: Secondary | ICD-10-CM

## 2012-12-05 DIAGNOSIS — N760 Acute vaginitis: Secondary | ICD-10-CM

## 2012-12-05 DIAGNOSIS — R350 Frequency of micturition: Secondary | ICD-10-CM

## 2012-12-05 DIAGNOSIS — Z3402 Encounter for supervision of normal first pregnancy, second trimester: Secondary | ICD-10-CM

## 2012-12-05 LAB — POCT URINALYSIS DIPSTICK
Bilirubin, UA: NEGATIVE
Blood, UA: NEGATIVE
Glucose, UA: NEGATIVE
Ketones, UA: NEGATIVE
Leukocytes, UA: NEGATIVE
Nitrite, UA: NEGATIVE
pH, UA: 6

## 2012-12-05 MED ORDER — NITROFURANTOIN MONOHYD MACRO 100 MG PO CAPS: 100.0000 mg | ORAL_CAPSULE | Freq: Two times a day (BID) | ORAL | Status: DC

## 2012-12-05 NOTE — Progress Notes (Signed)
P 98 Patient reports she has increase in pelvic pressure- with more discomfort on vulva. Pt has increase in discharge.

## 2012-12-05 NOTE — Addendum Note (Signed)
Addended by: Julaine Hua on: 12/05/2012 03:21 PM   Modules accepted: Orders

## 2012-12-06 LAB — WET PREP BY MOLECULAR PROBE: Candida species: NEGATIVE

## 2012-12-08 LAB — CULTURE, OB URINE: Colony Count: >=100000

## 2012-12-10 ENCOUNTER — Ambulatory Visit (INDEPENDENT_AMBULATORY_CARE_PROVIDER_SITE_OTHER): Payer: Medicaid Other

## 2012-12-10 ENCOUNTER — Ambulatory Visit (INDEPENDENT_AMBULATORY_CARE_PROVIDER_SITE_OTHER): Payer: Medicaid Other | Admitting: Obstetrics

## 2012-12-10 VITALS — BP 125/80 | Temp 98.1°F | Wt 150.6 lb

## 2012-12-10 DIAGNOSIS — Z3403 Encounter for supervision of normal first pregnancy, third trimester: Secondary | ICD-10-CM

## 2012-12-10 DIAGNOSIS — L299 Pruritus, unspecified: Secondary | ICD-10-CM

## 2012-12-10 DIAGNOSIS — O365931 Maternal care for other known or suspected poor fetal growth, third trimester, fetus 1: Secondary | ICD-10-CM

## 2012-12-10 DIAGNOSIS — O365990 Maternal care for other known or suspected poor fetal growth, unspecified trimester, not applicable or unspecified: Secondary | ICD-10-CM

## 2012-12-10 DIAGNOSIS — Z3400 Encounter for supervision of normal first pregnancy, unspecified trimester: Secondary | ICD-10-CM

## 2012-12-10 LAB — US OB DETAIL + 14 WK

## 2012-12-10 LAB — POCT URINALYSIS DIPSTICK
Bilirubin, UA: NEGATIVE
Blood, UA: NEGATIVE
Ketones, UA: NEGATIVE
Protein, UA: NEGATIVE
pH, UA: 7

## 2012-12-10 MED ORDER — PREDNISONE (PAK) 10 MG PO TABS: 10.0000 mg | ORAL_TABLET | Freq: Every day | ORAL | Status: DC

## 2012-12-10 NOTE — Progress Notes (Signed)
Pulse: 116 

## 2012-12-11 ENCOUNTER — Encounter: Payer: Self-pay | Admitting: Obstetrics & Gynecology

## 2012-12-12 ENCOUNTER — Encounter: Payer: Self-pay | Admitting: Obstetrics

## 2012-12-16 ENCOUNTER — Encounter: Payer: Self-pay | Admitting: Obstetrics

## 2012-12-16 ENCOUNTER — Other Ambulatory Visit: Payer: Self-pay | Admitting: *Deleted

## 2012-12-16 MED ORDER — AMPICILLIN 500 MG PO CAPS: ORAL_CAPSULE | ORAL | Status: DC

## 2012-12-16 NOTE — Progress Notes (Signed)
Pt called this morning stating she had been by the pharmacy to pick up her medication and it was not there. Pt states she meant to tell the nurse it should be called to the [facility] [place] Sent the prescription to the correct pharmacy.

## 2012-12-19 ENCOUNTER — Encounter: Payer: Self-pay | Admitting: Obstetrics

## 2012-12-24 ENCOUNTER — Ambulatory Visit (INDEPENDENT_AMBULATORY_CARE_PROVIDER_SITE_OTHER): Payer: Medicaid Other | Admitting: Obstetrics

## 2012-12-24 VITALS — BP 113/74 | Temp 97.7°F | Wt 155.4 lb

## 2012-12-24 DIAGNOSIS — Z3400 Encounter for supervision of normal first pregnancy, unspecified trimester: Secondary | ICD-10-CM

## 2012-12-24 DIAGNOSIS — Z3403 Encounter for supervision of normal first pregnancy, third trimester: Secondary | ICD-10-CM

## 2012-12-24 LAB — POCT URINALYSIS DIPSTICK
Bilirubin, UA: NEGATIVE
Ketones, UA: NEGATIVE
pH, UA: 5

## 2012-12-24 NOTE — Progress Notes (Signed)
Pulse 111  

## 2012-12-25 ENCOUNTER — Encounter: Payer: Self-pay | Admitting: Obstetrics

## 2012-12-31 ENCOUNTER — Encounter: Payer: Self-pay | Admitting: Obstetrics

## 2013-01-03 ENCOUNTER — Inpatient Hospital Stay (HOSPITAL_COMMUNITY)
Admission: AD | Admit: 2013-01-03 | Discharge: 2013-01-03 | Disposition: A | Discharge status: Home / Self Care | Payer: Medicaid Other | Source: Ambulatory Visit | Attending: Obstetrics & Gynecology | Admitting: Obstetrics & Gynecology

## 2013-01-03 DIAGNOSIS — O99891 Other specified diseases and conditions complicating pregnancy: Secondary | ICD-10-CM | POA: Insufficient documentation

## 2013-01-03 DIAGNOSIS — M545 Low back pain, unspecified: Secondary | ICD-10-CM | POA: Insufficient documentation

## 2013-01-03 DIAGNOSIS — O4700 False labor before 37 completed weeks of gestation, unspecified trimester: Secondary | ICD-10-CM | POA: Insufficient documentation

## 2013-01-03 DIAGNOSIS — N949 Unspecified condition associated with female genital organs and menstrual cycle: Secondary | ICD-10-CM | POA: Insufficient documentation

## 2013-01-03 DIAGNOSIS — R109 Unspecified abdominal pain: Secondary | ICD-10-CM | POA: Insufficient documentation

## 2013-01-03 LAB — WET PREP, GENITAL
Clue Cells Wet Prep HPF POC: NONE SEEN
Trich, Wet Prep: NONE SEEN
Yeast Wet Prep HPF POC: NONE SEEN

## 2013-01-03 LAB — URINALYSIS, ROUTINE W REFLEX MICROSCOPIC
Glucose, UA: 100 mg/dL — AB
Ketones, ur: NEGATIVE mg/dL
Protein, ur: NEGATIVE mg/dL

## 2013-01-03 MED ORDER — OXYCODONE-ACETAMINOPHEN 5-325 MG PO TABS
1.0000 | ORAL_TABLET | Freq: Once | ORAL | Status: AC
  Administered 2013-01-03: 1 via ORAL
  Filled 2013-01-03: qty 1

## 2013-01-03 NOTE — MAU Provider Note (Signed)
History     CSN: [id number]  Arrival date and time: 01/03/13 2134   First Provider Initiated Contact with Patient 01/03/13 2245      Chief Complaint  Patient presents with  . Back Pain  . Abdominal Pain   HPI Tracy [name] is a 27 y.o. G4P0030 at [redacted]w[redacted]d who presents with lower abdominal pain and low back pain which began earlier today.    She states that her pain is intermittent but severe when it comes on.  She denies and radiation.  She reports that movement exacerbates her pain.  She also denies and chest pain, SOB, dysuria, vaginal bleeding, or loss of fluid.  She does report white vaginal discharge and constipation.  Past Medical History  Diagnosis Date  . No pertinent past medical history   . Medical history non-contributory   . Depression     Past Surgical History  Procedure Laterality Date  . No past surgeries      No family history on file.  History  Substance Use Topics  . Smoking status: Never Smoker   . Smokeless tobacco: Never Used  . Alcohol Use: No    Allergies: No Known Allergies  Prescriptions prior to admission  Medication Sig Dispense Refill  . acetaminophen (TYLENOL) 325 MG tablet Take 325 mg by mouth every 6 (six) hours as needed for pain. Stomach pain      . Prenatal Vit-Fe Fumarate-FA (PRENATAL MULTIVITAMIN) TABS Take 1 tablet by mouth daily.        ROS Per HPI Physical Exam   Blood pressure 119/76, pulse 89, temperature 98.1 F (36.7 C), resp. rate 18, height 4\' 11"  (1.499 m), weight 72.122 kg (159 lb), last menstrual period 05/09/2012, SpO2 98.00%.  Physical Exam Gen: well appearing, NAD. Heart: RRR. Lungs: CTAB.  Abd: gravid. Slightly tender to palpation in the RLQ and LLQ. Ext: no appreciable lower extremity edema bilaterally Neuro: No focal deficits. GU: normal appearing external genitalia Pelvic Exam:        External: normal female genitalia without lesions or masses.          Vagina: normal without lesions or  masses.  White         Cervix: normal without lesions or masses        Samples for Wet prep, GC/Chlamydia obtained  Cervical check: Dilation: Closed Effacement (%): Thick Cervical Position: Posterior Exam by:: Cook,MD/Weston,RN  FHR: baseline 140, Mod variability, 15x15 accels, No decels Toco: 3 over 1 hour period  Results for orders placed during the hospital encounter of 01/03/13 (from the past 24 hour(s))  URINALYSIS, ROUTINE W REFLEX MICROSCOPIC     Status: Abnormal   Collection Time    01/03/13  9:50 PM      Result Value Range   Color, Urine YELLOW  YELLOW   APPearance CLEAR  CLEAR   Specific Gravity, Urine 1.010  1.005 - 1.030   pH 6.0  5.0 - 8.0   Glucose, UA 100 (*) NEGATIVE mg/dL   Hgb urine dipstick SMALL (*) NEGATIVE   Bilirubin Urine NEGATIVE  NEGATIVE   Ketones, ur NEGATIVE  NEGATIVE mg/dL   Protein, ur NEGATIVE  NEGATIVE mg/dL   Urobilinogen, UA 0.2  0.0 - 1.0 mg/dL   Nitrite NEGATIVE  NEGATIVE   Leukocytes, UA NEGATIVE  NEGATIVE  URINE MICROSCOPIC-ADD ON     Status: None   Collection Time    01/03/13  9:50 PM      Result Value Range   Squamous Epithelial /  LPF RARE  RARE   WBC, UA 0-2  <3 WBC/hpf   RBC / HPF 0-2  <3 RBC/hpf  WET PREP, GENITAL     Status: Abnormal   Collection Time    01/03/13 11:00 PM      Result Value Range   Yeast Wet Prep HPF POC NONE SEEN  NONE SEEN   Trich, Wet Prep NONE SEEN  NONE SEEN   Clue Cells Wet Prep HPF POC NONE SEEN  NONE SEEN   WBC, Wet Prep HPF POC FEW (*) NONE SEEN     MAU Course  Procedures  Assessment and Plan  Tracy [name] is a 27 y.o. G4P0030 at [redacted]w[redacted]d who presents with low back pain, abdominal pain and vaginal discharge. - Wet prep and GC chlamydia obtained. Wet prep negative.  No signs of labor at this time - minimal contractions and closed cervix. - Pain appears to be musculoskeletal - low back and round ligament - Discussed with Dr. Tamela Oddi. Home with supportive care - warm bath, tylenol -  Follow up no Tuesday as scheduled.  Napoleon Form, MD 01/03/2013 11:28 PM

## 2013-01-03 NOTE — MAU Note (Signed)
Pt reports lower back pain and lower abd pain x 4 hours, comes and goes. States she has noticed that her underwear have been "wet" today and she has a white discharge. Denies dysuria.

## 2013-01-06 LAB — GC/CHLAMYDIA PROBE AMP
CT Probe RNA: POSITIVE — AB
GC Probe RNA: NEGATIVE

## 2013-01-07 ENCOUNTER — Ambulatory Visit (INDEPENDENT_AMBULATORY_CARE_PROVIDER_SITE_OTHER): Payer: Medicaid Other | Admitting: Obstetrics

## 2013-01-07 ENCOUNTER — Encounter: Payer: Self-pay | Admitting: Obstetrics

## 2013-01-07 VITALS — BP 121/80 | Temp 98.3°F | Wt 158.6 lb

## 2013-01-07 DIAGNOSIS — Z3403 Encounter for supervision of normal first pregnancy, third trimester: Secondary | ICD-10-CM

## 2013-01-07 DIAGNOSIS — Z3400 Encounter for supervision of normal first pregnancy, unspecified trimester: Secondary | ICD-10-CM

## 2013-01-07 LAB — POCT URINALYSIS DIPSTICK
Blood, UA: NEGATIVE
Glucose, UA: NEGATIVE
Protein, UA: NEGATIVE
Spec Grav, UA: <=1.005
Urobilinogen, UA: NEGATIVE
pH, UA: 8

## 2013-01-07 NOTE — Progress Notes (Signed)
Pulse: 89

## 2013-01-10 ENCOUNTER — Encounter: Payer: Self-pay | Admitting: Obstetrics

## 2013-01-15 ENCOUNTER — Ambulatory Visit (INDEPENDENT_AMBULATORY_CARE_PROVIDER_SITE_OTHER): Payer: Medicaid Other | Admitting: Obstetrics

## 2013-01-15 VITALS — BP 118/78 | Temp 98.0°F | Wt 163.8 lb

## 2013-01-15 DIAGNOSIS — Z3403 Encounter for supervision of normal first pregnancy, third trimester: Secondary | ICD-10-CM

## 2013-01-15 DIAGNOSIS — Z3400 Encounter for supervision of normal first pregnancy, unspecified trimester: Secondary | ICD-10-CM

## 2013-01-15 LAB — POCT URINALYSIS DIPSTICK
Blood, UA: NEGATIVE
Ketones, UA: NEGATIVE
Protein, UA: NEGATIVE
Spec Grav, UA: 1.015

## 2013-01-15 NOTE — Progress Notes (Signed)
Pulse: 99 

## 2013-01-17 ENCOUNTER — Encounter: Payer: Self-pay | Admitting: Obstetrics

## 2013-01-21 ENCOUNTER — Ambulatory Visit (INDEPENDENT_AMBULATORY_CARE_PROVIDER_SITE_OTHER): Payer: Medicaid Other | Admitting: Obstetrics

## 2013-01-21 DIAGNOSIS — Z3480 Encounter for supervision of other normal pregnancy, unspecified trimester: Secondary | ICD-10-CM

## 2013-01-21 DIAGNOSIS — Z3483 Encounter for supervision of other normal pregnancy, third trimester: Secondary | ICD-10-CM

## 2013-01-21 LAB — POCT URINALYSIS DIPSTICK
Bilirubin, UA: NEGATIVE
Ketones, UA: NEGATIVE
Spec Grav, UA: 1.015
pH, UA: 8

## 2013-01-21 NOTE — Progress Notes (Signed)
Pulse-101 Pt c/o hands being numb x 1 week with pain when trying to close them. Pt also c/o bilateral lower leg pain x 2 to 3 days.

## 2013-01-23 LAB — STREP B DNA PROBE: GBSP: NEGATIVE

## 2013-01-30 ENCOUNTER — Ambulatory Visit (INDEPENDENT_AMBULATORY_CARE_PROVIDER_SITE_OTHER): Payer: Medicaid Other | Admitting: Obstetrics

## 2013-01-30 ENCOUNTER — Encounter: Payer: Self-pay | Admitting: Obstetrics

## 2013-01-30 VITALS — BP 117/76 | Temp 98.0°F | Wt 163.8 lb

## 2013-01-30 DIAGNOSIS — Z3483 Encounter for supervision of other normal pregnancy, third trimester: Secondary | ICD-10-CM

## 2013-01-30 DIAGNOSIS — Z3480 Encounter for supervision of other normal pregnancy, unspecified trimester: Secondary | ICD-10-CM

## 2013-01-30 LAB — POCT URINALYSIS DIPSTICK
Glucose, UA: NEGATIVE
Nitrite, UA: NEGATIVE
Urobilinogen, UA: NEGATIVE

## 2013-01-30 NOTE — Progress Notes (Signed)
Pulse-83 Pt c/o bilateral hand pain and numbness.

## 2013-02-11 ENCOUNTER — Encounter: Payer: Self-pay | Admitting: Obstetrics

## 2013-02-11 ENCOUNTER — Ambulatory Visit (INDEPENDENT_AMBULATORY_CARE_PROVIDER_SITE_OTHER): Payer: Medicaid Other | Admitting: Obstetrics

## 2013-02-11 VITALS — BP 122/84 | Temp 97.0°F | Wt 164.0 lb

## 2013-02-11 DIAGNOSIS — Z3483 Encounter for supervision of other normal pregnancy, third trimester: Secondary | ICD-10-CM

## 2013-02-11 DIAGNOSIS — Z3480 Encounter for supervision of other normal pregnancy, unspecified trimester: Secondary | ICD-10-CM

## 2013-02-11 LAB — POCT URINALYSIS DIPSTICK
Leukocytes, UA: NEGATIVE
Nitrite, UA: NEGATIVE
Protein, UA: NEGATIVE
Urobilinogen, UA: NEGATIVE

## 2013-02-11 NOTE — Progress Notes (Signed)
Pulse-92 No complaints  

## 2013-02-13 ENCOUNTER — Encounter (HOSPITAL_COMMUNITY): Payer: Self-pay

## 2013-02-13 ENCOUNTER — Inpatient Hospital Stay (HOSPITAL_COMMUNITY)
Admission: AD | Admit: 2013-02-13 | Discharge: 2013-02-13 | Disposition: A | Discharge status: Home / Self Care | Payer: Medicaid Other | Source: Ambulatory Visit | Attending: Obstetrics & Gynecology | Admitting: Obstetrics & Gynecology

## 2013-02-13 DIAGNOSIS — O479 False labor, unspecified: Secondary | ICD-10-CM | POA: Insufficient documentation

## 2013-02-18 ENCOUNTER — Ambulatory Visit (INDEPENDENT_AMBULATORY_CARE_PROVIDER_SITE_OTHER): Payer: Medicaid Other | Admitting: Obstetrics

## 2013-02-18 VITALS — BP 128/87 | Temp 98.1°F | Wt 169.0 lb

## 2013-02-18 DIAGNOSIS — O480 Post-term pregnancy: Secondary | ICD-10-CM

## 2013-02-18 DIAGNOSIS — Z3401 Encounter for supervision of normal first pregnancy, first trimester: Secondary | ICD-10-CM

## 2013-02-18 DIAGNOSIS — Z3400 Encounter for supervision of normal first pregnancy, unspecified trimester: Secondary | ICD-10-CM

## 2013-02-18 LAB — POCT URINALYSIS DIPSTICK
Leukocytes, UA: NEGATIVE
Nitrite, UA: NEGATIVE
pH, UA: 7

## 2013-02-18 NOTE — Progress Notes (Signed)
P-101 Pressure in lower abdomen and lower back pain Pt states she is having a brown discharge

## 2013-02-19 ENCOUNTER — Inpatient Hospital Stay (HOSPITAL_COMMUNITY): Payer: Medicaid Other | Admitting: Anesthesiology

## 2013-02-19 ENCOUNTER — Encounter (HOSPITAL_COMMUNITY): Payer: Self-pay

## 2013-02-19 ENCOUNTER — Encounter (HOSPITAL_COMMUNITY): Payer: Self-pay | Admitting: Anesthesiology

## 2013-02-19 ENCOUNTER — Inpatient Hospital Stay (HOSPITAL_COMMUNITY)
Admission: RE | Admit: 2013-02-19 | Discharge: 2013-02-21 | DRG: 775 | Disposition: A | Discharge status: Home / Self Care | Payer: Medicaid Other | Source: Ambulatory Visit | Attending: Obstetrics & Gynecology | Admitting: Obstetrics & Gynecology

## 2013-02-19 DIAGNOSIS — O480 Post-term pregnancy: Principal | ICD-10-CM | POA: Diagnosis present

## 2013-02-19 DIAGNOSIS — O328XX0 Maternal care for other malpresentation of fetus, not applicable or unspecified: Secondary | ICD-10-CM | POA: Diagnosis present

## 2013-02-19 HISTORY — DX: Disorder of teeth and supporting structures, unspecified: K08.9

## 2013-02-19 LAB — COMPREHENSIVE METABOLIC PANEL
ALT: 11 U/L (ref 0–35)
AST: 19 U/L (ref 0–37)
Albumin: 2.3 g/dL — ABNORMAL LOW (ref 3.5–5.2)
CO2: 20 mEq/L (ref 19–32)
Chloride: 101 mEq/L (ref 96–112)
Creatinine, Ser: 0.57 mg/dL (ref 0.50–1.10)
Sodium: 134 mEq/L — ABNORMAL LOW (ref 135–145)
Total Bilirubin: 0.3 mg/dL (ref 0.3–1.2)

## 2013-02-19 LAB — CBC
HCT: 35.6 % — ABNORMAL LOW (ref 36.0–46.0)
Hemoglobin: 11.8 g/dL — ABNORMAL LOW (ref 12.0–15.0)
MCH: 27.3 pg (ref 26.0–34.0)
MCV: 82.2 fL (ref 78.0–100.0)
Platelets: 235 10*3/uL (ref 150–400)
RBC: 4.33 MIL/uL (ref 3.87–5.11)

## 2013-02-19 LAB — TYPE AND SCREEN

## 2013-02-19 LAB — LACTATE DEHYDROGENASE: LDH: 136 U/L (ref 94–250)

## 2013-02-19 MED ORDER — NALBUPHINE SYRINGE 5 MG/0.5 ML
10.0000 mg | INJECTION | Freq: Four times a day (QID) | INTRAMUSCULAR | Status: DC | PRN
  Filled 2013-02-19: qty 1
  Filled 2013-02-19: qty 0.5

## 2013-02-19 MED ORDER — IBUPROFEN 600 MG PO TABS: 600.0000 mg | ORAL_TABLET | Freq: Four times a day (QID) | ORAL | Status: DC | PRN

## 2013-02-19 MED ORDER — NALBUPHINE SYRINGE 5 MG/0.5 ML
10.0000 mg | INJECTION | INTRAMUSCULAR | Status: DC | PRN
  Filled 2013-02-19: qty 1
  Filled 2013-02-19: qty 0.5

## 2013-02-19 MED ORDER — DIPHENHYDRAMINE HCL 50 MG/ML IJ SOLN: 12.5000 mg | INTRAMUSCULAR | Status: DC | PRN

## 2013-02-19 MED ORDER — LIDOCAINE HCL (PF) 1 % IJ SOLN
INTRAMUSCULAR | Status: DC | PRN
  Administered 2013-02-19: 9 mL
  Administered 2013-02-19: 8 mL

## 2013-02-19 MED ORDER — CITRIC ACID-SODIUM CITRATE 334-500 MG/5ML PO SOLN: 30.0000 mL | ORAL | Status: DC | PRN

## 2013-02-19 MED ORDER — FENTANYL 2.5 MCG/ML BUPIVACAINE 1/10 % EPIDURAL INFUSION (WH - ANES)
INTRAMUSCULAR | Status: DC | PRN
  Administered 2013-02-19: 14 mL/h via EPIDURAL

## 2013-02-19 MED ORDER — LACTATED RINGERS IV SOLN: 500.0000 mL | INTRAVENOUS | Status: DC | PRN

## 2013-02-19 MED ORDER — FENTANYL 2.5 MCG/ML BUPIVACAINE 1/10 % EPIDURAL INFUSION (WH - ANES)
14.0000 mL/h | INTRAMUSCULAR | Status: DC | PRN
  Filled 2013-02-19 (×2): qty 125

## 2013-02-19 MED ORDER — FLEET ENEMA 7-19 GM/118ML RE ENEM: 1.0000 | ENEMA | RECTAL | Status: DC | PRN

## 2013-02-19 MED ORDER — TERBUTALINE SULFATE 1 MG/ML IJ SOLN: 0.2500 mg | Freq: Once | INTRAMUSCULAR | Status: AC | PRN

## 2013-02-19 MED ORDER — HYDROXYZINE HCL 50 MG/ML IM SOLN: 50.0000 mg | Freq: Four times a day (QID) | INTRAMUSCULAR | Status: DC | PRN

## 2013-02-19 MED ORDER — ONDANSETRON HCL 4 MG/2ML IJ SOLN: 4.0000 mg | Freq: Four times a day (QID) | INTRAMUSCULAR | Status: DC | PRN

## 2013-02-19 MED ORDER — BUTORPHANOL TARTRATE 1 MG/ML IJ SOLN
2.0000 mg | INTRAMUSCULAR | Status: DC | PRN
  Administered 2013-02-19: 2 mg via INTRAVENOUS
  Filled 2013-02-19: qty 2

## 2013-02-19 MED ORDER — ACETAMINOPHEN 325 MG PO TABS
650.0000 mg | ORAL_TABLET | ORAL | Status: DC | PRN
  Administered 2013-02-19: 650 mg via ORAL
  Filled 2013-02-19: qty 2

## 2013-02-19 MED ORDER — EPHEDRINE 5 MG/ML INJ
10.0000 mg | INTRAVENOUS | Status: DC | PRN
  Filled 2013-02-19: qty 2

## 2013-02-19 MED ORDER — LIDOCAINE HCL (PF) 1 % IJ SOLN
30.0000 mL | INTRAMUSCULAR | Status: DC | PRN
  Filled 2013-02-19 (×2): qty 30

## 2013-02-19 MED ORDER — OXYCODONE-ACETAMINOPHEN 5-325 MG PO TABS: 1.0000 | ORAL_TABLET | ORAL | Status: DC | PRN

## 2013-02-19 MED ORDER — PHENYLEPHRINE 40 MCG/ML (10ML) SYRINGE FOR IV PUSH (FOR BLOOD PRESSURE SUPPORT)
80.0000 ug | PREFILLED_SYRINGE | INTRAVENOUS | Status: DC | PRN
  Filled 2013-02-19: qty 2

## 2013-02-19 MED ORDER — PHENYLEPHRINE 40 MCG/ML (10ML) SYRINGE FOR IV PUSH (FOR BLOOD PRESSURE SUPPORT)
80.0000 ug | PREFILLED_SYRINGE | INTRAVENOUS | Status: DC | PRN
  Filled 2013-02-19: qty 2
  Filled 2013-02-19: qty 5

## 2013-02-19 MED ORDER — OXYTOCIN 40 UNITS IN LACTATED RINGERS INFUSION - SIMPLE MED
1.0000 m[IU]/min | INTRAVENOUS | Status: DC
  Administered 2013-02-19: 2 m[IU]/min via INTRAVENOUS
  Filled 2013-02-19: qty 1000

## 2013-02-19 MED ORDER — OXYTOCIN 40 UNITS IN LACTATED RINGERS INFUSION - SIMPLE MED
62.5000 mL/h | INTRAVENOUS | Status: DC
  Administered 2013-02-20: 62.5 mL/h via INTRAVENOUS

## 2013-02-19 MED ORDER — EPHEDRINE 5 MG/ML INJ
10.0000 mg | INTRAVENOUS | Status: DC | PRN
  Filled 2013-02-19: qty 2
  Filled 2013-02-19: qty 4

## 2013-02-19 MED ORDER — LACTATED RINGERS IV SOLN
500.0000 mL | Freq: Once | INTRAVENOUS | Status: AC
  Administered 2013-02-19: 1000 mL via INTRAVENOUS

## 2013-02-19 MED ORDER — OXYTOCIN BOLUS FROM INFUSION: 500.0000 mL | INTRAVENOUS | Status: DC

## 2013-02-19 MED ORDER — LACTATED RINGERS IV SOLN
INTRAVENOUS | Status: DC
  Administered 2013-02-19 – 2013-02-20 (×2): via INTRAVENOUS

## 2013-02-19 NOTE — Progress Notes (Signed)
Tracy [name] is a 27 y.o. G4P0030 at [redacted]w[redacted]d by LMP admitted for induction of labor due to postterm.  Subjective: Comfortable  Objective: BP 134/94  Pulse 103  Temp(Src) 98.7 F (37.1 C) (Oral)  Resp 16  Ht 4\' 11"  (1.499 m)  Wt 169 lb (76.658 kg)  BMI 34.12 kg/m2  SpO2 96%  LMP 05/09/2012      FHT:  FHR: 140 bpm, variability: moderate,  accelerations:  Present,  decelerations:  Present early UC:   regular, every 2 minutes SVE:   Dilation: 6 Effacement (%): 90 Station: -1 Exam by:: [employer]: Lab Results  Component Value Date   WBC 10.3 02/19/2013   HGB 11.8* 02/19/2013   HCT 35.6* 02/19/2013   MCV 82.2 02/19/2013   PLT 235 02/19/2013    Assessment / Plan: Induction of labor due to postterm,  progressing well on pitocin  Labor: Progressing normally Preeclampsia:  n/a Fetal Wellbeing:  Category I Pain Control:  Epidural I/D:  n/a Anticipated MOD:  NSVD  JACKSON-MOORE,Nelissa Bolduc A 02/19/2013, 10:01 PM

## 2013-02-19 NOTE — H&P (Signed)
Tracy [name] is a 27 y.o. female presenting for IOL. Maternal Medical History:  Reason for admission: She is admitted for IOL secondary to a postterm pregnancy,  Fetal activity: Perceived fetal activity is normal.    Prenatal complications: Infection: asymptomatic bacteruria, chlamydia.   Prenatal Complications - Diabetes: none.    OB History   Grav Para Term Preterm Abortions TAB SAB Ect Mult Living   4 0 0 0 3 0 3 0 0 0      Past Medical History  Diagnosis Date  . No pertinent past medical history   . Depression   . Teeth problem     cracked molars, pt unsure if just right side or both sides   Past Surgical History  Procedure Laterality Date  . No past surgeries     Family History: family history is not on file. Social History:  reports that she has never smoked. She has never used smokeless tobacco. She reports that she does not drink alcohol or use illicit drugs.     Review of Systems  Constitutional: Negative for fever.  Eyes: Negative for blurred vision.  Respiratory: Negative for shortness of breath.   Gastrointestinal: Negative for vomiting.  Skin: Negative for rash.  Neurological: Negative for headaches.     Blood pressure 138/89, pulse 93, temperature 97.6 F (36.4 C), temperature source Oral, resp. rate 18, height 4\' 11"  (1.499 m), weight 169 lb (76.658 kg), last menstrual period 05/09/2012, SpO2 96.00%. Maternal Exam:  Abdomen: Fetal presentation: vertex  Introitus: not evaluated.   Cervix: Cervix evaluated by digital exam.     Physical Exam  Constitutional: She appears well-developed.  HENT:  Head: Normocephalic.  Neck: Neck supple. No thyromegaly present.  Cardiovascular: Normal rate and regular rhythm.   Respiratory: Breath sounds normal.  GI: Soft. Bowel sounds are normal.  Skin: No rash noted.    Prenatal labs: ABO, Rh: --/--/O POS (07/23 4540) Antibody: NEG (07/23 0835) Rubella: Nonimmune (02/20 0000) RPR: NON REACTIVE  (07/23 0825)  HBsAg: Negative (02/20 0000)  HIV: NON REACTIVE (03/20 0933)  GBS: NEGATIVE (06/24 1457)   Assessment/Plan: Nullipara @ [redacted]w[redacted]d.  Favorable Bishop's score. Category I FHT.  Admit Low dose Pitocin per protocol   JACKSON-MOORE,Ambyr Qadri A 02/19/2013, 8:01 PM

## 2013-02-19 NOTE — Anesthesia Preprocedure Evaluation (Signed)
Anesthesia Evaluation  Patient identified by MRN, date of birth, ID band Patient awake    Reviewed: Allergy & Precautions, H&P , NPO status , Patient's Chart, lab work & pertinent test results  Airway Mallampati: II TM Distance: >3 FB Neck ROM: full    Dental no notable dental hx.    Pulmonary neg pulmonary ROS,    Pulmonary exam normal       Cardiovascular negative cardio ROS      Neuro/Psych negative neurological ROS     GI/Hepatic negative GI ROS, Neg liver ROS,   Endo/Other  negative endocrine ROS  Renal/GU negative Renal ROS  negative genitourinary   Musculoskeletal negative musculoskeletal ROS (+)   Abdominal Normal abdominal exam  (+)   Peds negative pediatric ROS (+)  Hematology negative hematology ROS (+)   Anesthesia Other Findings   Reproductive/Obstetrics (+) Pregnancy                           Anesthesia Physical Anesthesia Plan  ASA: II  Anesthesia Plan: Epidural   Post-op Pain Management:    Induction:   Airway Management Planned:   Additional Equipment:   Intra-op Plan:   Post-operative Plan:   Informed Consent: I have reviewed the patients History and Physical, chart, labs and discussed the procedure including the risks, benefits and alternatives for the proposed anesthesia with the patient or authorized representative who has indicated his/her understanding and acceptance.     Plan Discussed with:   Anesthesia Plan Comments:         Anesthesia Quick Evaluation

## 2013-02-19 NOTE — Anesthesia Procedure Notes (Signed)
Epidural Patient location during procedure: OB Start time: 02/19/2013 3:57 PM End time: 02/19/2013 4:03 PM  Staffing Anesthesiologist: Sandrea Hughs Performed by: anesthesiologist   Preanesthetic Checklist Completed: patient identified, surgical consent, pre-op evaluation, timeout performed, IV checked, risks and benefits discussed and monitors and equipment checked  Epidural Patient position: sitting Prep: site prepped and draped and DuraPrep Patient monitoring: continuous pulse ox and blood pressure Approach: midline Injection technique: LOR air  Needle:  Needle type: Tuohy  Needle gauge: 17 G Needle length: 9 cm and 9 Needle insertion depth: 6 cm Catheter type: closed end flexible Catheter size: 19 Gauge Catheter at skin depth: 11 cm Test dose: negative and Other  Assessment Sensory level: T9 Events: blood not aspirated, injection not painful, no injection resistance, negative IV test and no paresthesia  Additional Notes Reason for block:procedure for pain

## 2013-02-20 ENCOUNTER — Encounter (HOSPITAL_COMMUNITY): Payer: Self-pay

## 2013-02-20 ENCOUNTER — Encounter: Payer: Self-pay | Admitting: Obstetrics

## 2013-02-20 MED ORDER — DIPHENHYDRAMINE HCL 25 MG PO CAPS: 25.0000 mg | ORAL_CAPSULE | Freq: Four times a day (QID) | ORAL | Status: DC | PRN

## 2013-02-20 MED ORDER — ONDANSETRON HCL 4 MG/2ML IJ SOLN: 4.0000 mg | INTRAMUSCULAR | Status: DC | PRN

## 2013-02-20 MED ORDER — FERROUS SULFATE 325 (65 FE) MG PO TABS
325.0000 mg | ORAL_TABLET | Freq: Two times a day (BID) | ORAL | Status: DC
  Administered 2013-02-20 – 2013-02-21 (×4): 325 mg via ORAL
  Filled 2013-02-20 (×4): qty 1

## 2013-02-20 MED ORDER — LANOLIN HYDROUS EX OINT: TOPICAL_OINTMENT | CUTANEOUS | Status: DC | PRN

## 2013-02-20 MED ORDER — MAGNESIUM HYDROXIDE 400 MG/5ML PO SUSP: 30.0000 mL | ORAL | Status: DC | PRN

## 2013-02-20 MED ORDER — ZOLPIDEM TARTRATE 5 MG PO TABS: 5.0000 mg | ORAL_TABLET | Freq: Every evening | ORAL | Status: DC | PRN

## 2013-02-20 MED ORDER — WITCH HAZEL-GLYCERIN EX PADS: 1.0000 "application " | MEDICATED_PAD | CUTANEOUS | Status: DC | PRN

## 2013-02-20 MED ORDER — ONDANSETRON HCL 4 MG PO TABS: 4.0000 mg | ORAL_TABLET | ORAL | Status: DC | PRN

## 2013-02-20 MED ORDER — DIBUCAINE 1 % RE OINT: 1.0000 "application " | TOPICAL_OINTMENT | RECTAL | Status: DC | PRN

## 2013-02-20 MED ORDER — PRENATAL MULTIVITAMIN CH
1.0000 | ORAL_TABLET | Freq: Every day | ORAL | Status: DC
  Administered 2013-02-20 – 2013-02-21 (×2): 1 via ORAL
  Filled 2013-02-20 (×2): qty 1

## 2013-02-20 MED ORDER — IBUPROFEN 600 MG PO TABS
600.0000 mg | ORAL_TABLET | Freq: Four times a day (QID) | ORAL | Status: DC
  Administered 2013-02-20 – 2013-02-21 (×7): 600 mg via ORAL
  Filled 2013-02-20 (×7): qty 1

## 2013-02-20 MED ORDER — OXYCODONE-ACETAMINOPHEN 5-325 MG PO TABS
1.0000 | ORAL_TABLET | ORAL | Status: DC | PRN
  Administered 2013-02-20: 1 via ORAL
  Filled 2013-02-20: qty 1

## 2013-02-20 MED ORDER — MEASLES, MUMPS & RUBELLA VAC ~~LOC~~ INJ
0.5000 mL | INJECTION | Freq: Once | SUBCUTANEOUS | Status: AC
  Administered 2013-02-21: 0.5 mL via SUBCUTANEOUS
  Filled 2013-02-20 (×2): qty 0.5

## 2013-02-20 MED ORDER — SENNOSIDES-DOCUSATE SODIUM 8.6-50 MG PO TABS
2.0000 | ORAL_TABLET | Freq: Every day | ORAL | Status: DC
  Administered 2013-02-21: 2 via ORAL

## 2013-02-20 MED ORDER — TETANUS-DIPHTH-ACELL PERTUSSIS 5-2.5-18.5 LF-MCG/0.5 IM SUSP
0.5000 mL | Freq: Once | INTRAMUSCULAR | Status: AC
  Administered 2013-02-21: 0.5 mL via INTRAMUSCULAR

## 2013-02-20 MED ORDER — BENZOCAINE-MENTHOL 20-0.5 % EX AERO
1.0000 "application " | INHALATION_SPRAY | CUTANEOUS | Status: DC | PRN
  Administered 2013-02-20: 1 via TOPICAL
  Filled 2013-02-20: qty 56

## 2013-02-20 NOTE — Addendum Note (Signed)
Addendum created 02/20/13 1453 by Turner Daniels, CRNA   Modules edited: Anesthesia Responsible Staff

## 2013-02-20 NOTE — Anesthesia Postprocedure Evaluation (Signed)
  Anesthesia Post-op Note  Anesthesia Post Note  Patient: Tracy [name]  Procedure(s) Performed: * No procedures listed *  Anesthesia type: Epidural  Patient location: Mother/Baby  Post pain: Pain level controlled  Post assessment: Post-op Vital signs reviewed  Last Vitals:  Filed Vitals:   02/20/13 1308  BP:   Pulse: 104  Temp:   Resp: 20    Post vital signs: Reviewed  Level of consciousness:alert  Complications: No apparent anesthesia complications

## 2013-02-20 NOTE — Anesthesia Postprocedure Evaluation (Signed)
  Anesthesia Post-op Note  Anesthesia Post Note  Patient: Tracy [name]  Procedure(s) Performed: * No procedures listed *  Anesthesia type: Spinal  Patient location: Mother/Baby  Post pain: Pain level controlled  Post assessment: Post-op Vital signs reviewed  Last Vitals:  Filed Vitals:   02/20/13 1308  BP:   Pulse: 104  Temp:   Resp: 20    Post vital signs: Reviewed  Level of consciousness: awake  Complications: No apparent anesthesia complications

## 2013-02-20 NOTE — Progress Notes (Signed)
UR completed 

## 2013-02-20 NOTE — Progress Notes (Signed)
Post Partum Day 1 Subjective: no complaints, up ad lib, voiding, tolerating PO and + flatus  Objective: Blood pressure 125/83, pulse 104, temperature 97.8 F (36.6 C), temperature source Oral, resp. rate 18, height 4\' 11"  (1.499 m), weight 169 lb (76.658 kg), last menstrual period 05/09/2012, SpO2 97.00%, unknown if currently breastfeeding.  Physical Exam:  General: alert and no distress Lochia: appropriate Uterine Fundus: firm Incision: healing well DVT Evaluation: No evidence of DVT seen on physical exam.   Recent Labs  02/19/13 0825  HGB 11.8*  HCT 35.6*    Assessment/Plan: Doing well.  Routine.  LOS: 1 day   HARPER,CHARLES A 02/20/2013, 5:45 AM

## 2013-02-20 NOTE — Anesthesia Postprocedure Evaluation (Signed)
  Anesthesia Post-op Note  Patient: Tracy [name]  Procedure(s) Performed: * No procedures listed *  Patient Location: PACU and Mother/Baby  Anesthesia Type:Epidural  Level of Consciousness: awake, alert , oriented and patient cooperative  Airway and Oxygen Therapy: Patient Spontanous Breathing  Post-op Pain: none  Post-op Assessment: Post-op Vital signs reviewed, Patient's Cardiovascular Status Stable and Respiratory Function Stable  Post-op Vital Signs: Reviewed and stable  Complications: Patient complaint of residual numbness upper aspect right thigh down level of knee.  Will recheck this pm. Motor function intact. Patient pushed for extended period of time per patient.

## 2013-02-21 MED ORDER — OXYCODONE-ACETAMINOPHEN 5-325 MG PO TABS: 1.0000 | ORAL_TABLET | ORAL | Status: DC | PRN

## 2013-02-21 MED ORDER — IBUPROFEN 600 MG PO TABS: 600.0000 mg | ORAL_TABLET | Freq: Four times a day (QID) | ORAL | Status: DC | PRN

## 2013-02-21 NOTE — Lactation Note (Signed)
This note was copied from the chart of Tracy Miguel Medal. Lactation Consultation Note Mom states baby is feeding much better today. Mom states that baby just fed for 25 minutes on the right using the nipple shield. Mom states there was evidence of breast milk in the nipple shield when baby was finished feeding. Mom states she is still having difficulty feeding on the left side. Baby is now in bassinet, mom just ordered her breakfast.  Enc mom to call for latch assistance with baby's next feeding.   Patient Name: Tracy [name] [id number] Date: 02/21/2013 Reason for consult: Follow-up assessment   Maternal Data    Feeding Feeding Type: Breast Milk (per mom) Length of feed: 25 min  LATCH Score/Interventions                      Lactation Tools Discussed/Used Tools: Nipple Shields   Consult Status Consult Status: Follow-up Follow-up type: In-patient    Octavio Manns [facility] 02/21/2013, 10:39 AM

## 2013-02-21 NOTE — Discharge Summary (Signed)
Obstetric Discharge Summary Reason for Admission: induction of labor Prenatal Procedures: NST and ultrasound Intrapartum Procedures: spontaneous vaginal delivery Postpartum Procedures: none Complications-Operative and Postpartum: none Hemoglobin  Date Value Range Status  02/19/2013 11.8* 12.0 - 15.0 g/dL Final  1/61/0960 9.8   Final     HCT  Date Value Range Status  02/19/2013 35.6* 36.0 - 46.0 % Final  09/19/2012 31   Final    Physical Exam:  General: alert and no distress Lochia: appropriate Uterine Fundus: firm Incision: healing well DVT Evaluation: No evidence of DVT seen on physical exam.  Discharge Diagnoses: Term Pregnancy-delivered  Discharge Information: Date: 02/21/2013 Activity: pelvic rest Diet: routine Medications: PNV, Ibuprofen, Colace and Percocet Condition: stable Instructions: refer to practice specific booklet Discharge to: home Follow-up Information   Follow up with Dreden Rivere A, MD. Schedule an appointment as soon as possible for a visit in 2 weeks.   Contact information:   [address] [place] [address] [phone]       Newborn Data: Live born female  Birth Weight: 7 lb 3 oz (3260 g) APGAR: 6, 9  Home with mother.  Tracy [name] A 02/21/2013, 8:27 AM

## 2013-02-21 NOTE — Progress Notes (Signed)
Post Partum Day 2 Subjective: no complaints  Objective: Blood pressure 116/85, pulse 104, temperature 97.8 F (36.6 C), temperature source Oral, resp. rate 18, height 4\' 11"  (1.499 m), weight 169 lb (76.658 kg), last menstrual period 05/09/2012, SpO2 99.00%, unknown if currently breastfeeding.  Physical Exam:  General: alert and no distress Lochia: appropriate Uterine Fundus: firm Incision: healing well DVT Evaluation: No evidence of DVT seen on physical exam.   Recent Labs  02/19/13 0825  HGB 11.8*  HCT 35.6*    Assessment/Plan: Discharge home   LOS: 2 days   Konya Fauble A 02/21/2013, 8:21 AM

## 2013-03-10 ENCOUNTER — Ambulatory Visit (INDEPENDENT_AMBULATORY_CARE_PROVIDER_SITE_OTHER): Payer: Medicaid Other | Admitting: Obstetrics

## 2013-03-10 DIAGNOSIS — Z3009 Encounter for other general counseling and advice on contraception: Secondary | ICD-10-CM

## 2013-03-10 DIAGNOSIS — Z3202 Encounter for pregnancy test, result negative: Secondary | ICD-10-CM

## 2013-03-10 DIAGNOSIS — Z309 Encounter for contraceptive management, unspecified: Secondary | ICD-10-CM

## 2013-03-10 LAB — POCT URINE PREGNANCY: Preg Test, Ur: NEGATIVE

## 2013-03-10 MED ORDER — NORETHINDRONE 0.35 MG PO TABS: 1.0000 | ORAL_TABLET | Freq: Every day | ORAL | Status: DC

## 2013-03-10 NOTE — Progress Notes (Signed)
Subjective:     Tracy [name] is a 27 y.o. female who presents for a postpartum visit. She is 18 days postpartum following a spontaneous vaginal delivery. I have fully reviewed the prenatal and intrapartum course. The delivery was at 41 gestational weeks. Outcome: spontaneous vaginal delivery. Anesthesia: epidural. Postpartum course has been normal. Baby's course has been normal. Baby is feeding by breast. Bleeding staining only. Bowel function is normal. Bladder function is normal. Patient is not sexually active. Contraception method is abstinence. Postpartum depression screening: negative.  The following portions of the patient's history were reviewed and updated as appropriate: allergies, current medications, past family history, past medical history, past social history, past surgical history and problem list.  Review of Systems Pertinent items are noted in HPI.   Objective:    BP 108/75  Pulse 93  Temp(Src) 97.9 F (36.6 C) (Oral)  Wt 148 lb (67.132 kg)  BMI 29.88 kg/m2  Breastfeeding? Yes  General:  alert and no distress   Breasts:    No exam  Lungs:   No exam  Heart:     No exam  Abdomen:    No exam   Vulva:    No exam  Vagina:    No exam  Cervix:     No exam  Corpus:    No exam  Adnexa:     No exam  Rectal Exam:    No exam        Assessment:    Consultation for contrception  Plan:    1. Contraception: oral progesterone-only contraceptive 2. Micronor Rx 3. Follow up in: 4 weeks or as needed.

## 2013-04-07 ENCOUNTER — Ambulatory Visit: Payer: Medicaid Other | Admitting: Obstetrics

## 2013-04-07 ENCOUNTER — Encounter: Payer: Self-pay | Admitting: Obstetrics

## 2013-04-07 ENCOUNTER — Ambulatory Visit (INDEPENDENT_AMBULATORY_CARE_PROVIDER_SITE_OTHER): Payer: Medicaid Other | Admitting: Obstetrics

## 2013-04-07 VITALS — BP 115/79 | HR 78 | Temp 98.2°F | Wt 150.0 lb

## 2013-04-07 DIAGNOSIS — N949 Unspecified condition associated with female genital organs and menstrual cycle: Secondary | ICD-10-CM | POA: Insufficient documentation

## 2013-04-07 DIAGNOSIS — Z3202 Encounter for pregnancy test, result negative: Secondary | ICD-10-CM

## 2013-04-07 LAB — POCT URINE PREGNANCY: Preg Test, Ur: NEGATIVE

## 2013-04-07 MED ORDER — OXYCODONE-ACETAMINOPHEN 10-325 MG PO TABS: 1.0000 | ORAL_TABLET | ORAL | Status: DC | PRN

## 2013-04-07 NOTE — Progress Notes (Signed)
Subjective:     Tracy [name] is a 27 y.o. female who presents for a postpartum visit. She is 6 weeks postpartum following a spontaneous vaginal delivery. I have fully reviewed the prenatal and intrapartum course. The delivery was at 41 gestational weeks. Outcome: spontaneous vaginal delivery. Anesthesia: epidural. Postpartum course has been normal. Baby's course has been normal. Baby is feeding by breast. Bleeding no bleeding. Bowel function is some constipation and lower abdominal discomfort. Bladder function is normal. Patient is sexually active. Contraception method is oral progesterone-only contraceptive. Postpartum depression screening: negative. Patient is also having headache. Patient states she has pain so bad that she cries- she hurts to bend.  The following portions of the patient's history were reviewed and updated as appropriate: allergies, current medications, past family history, past medical history, past social history, past surgical history and problem list.  Review of Systems Pertinent items are noted in HPI.   Objective:    BP 115/79  Pulse 78  Temp(Src) 98.2 F (36.8 C)  Wt 150 lb (68.04 kg)  BMI 30.28 kg/m2  Breastfeeding? Yes  General:  alert and no distress  Abdomen:  Soft, RLQ  Tenderness  Pelvic:  Uterus NSSC,NT.  Right adnexal tenderness.  ? Mass.                                Assessment:    RLQ tenderness. ? Right ovarian cyst.  Plan:    1. Contraception: oral progesterone-only contraceptive 2. Ultrasound ordered 3. Follow up in: 2 weeks or as needed.

## 2013-04-21 ENCOUNTER — Ambulatory Visit (INDEPENDENT_AMBULATORY_CARE_PROVIDER_SITE_OTHER): Payer: Medicaid Other | Admitting: Obstetrics

## 2013-04-21 ENCOUNTER — Encounter: Payer: Self-pay | Admitting: Obstetrics

## 2013-04-21 NOTE — Progress Notes (Signed)
.   Subjective:     Tracy [name] is a 27 y.o. female who presents for a postpartum visit. She is 8 weeks postpartum following a spontaneous vaginal delivery. I have fully reviewed the prenatal and intrapartum course. The delivery was at 41 gestational weeks. Outcome: spontaneous vaginal delivery. Anesthesia: epidural. Postpartum course has been normal. Baby's course has been normal. Baby is feeding by breast. Bleeding no bleeding. Bowel function is normal. Bladder function is normal. Patient is sexually active. Contraception method is oral progesterone-only contraceptive. Postpartum depression screening: negative.  The following portions of the patient's history were reviewed and updated as appropriate: allergies, current medications, past family history, past medical history, past social history, past surgical history and problem list.  Review of Systems Pertinent items are noted in HPI.   Objective:    BP 117/83  Pulse 74  Temp(Src) 97.8 F (36.6 C) (Oral)  Ht 5\' 4"  (1.626 m)  Wt 149 lb 12.8 oz (67.949 kg)  BMI 25.7 kg/m2  Breastfeeding? Yes  General:  alert and no distress  Abdomen:  Soft, NT Pelvic:  Uterus NSSC, NT.                                    Assessment:     Normal postpartum exam. Pap smear not done at today's visit.   Plan:    1. Contraception: oral progesterone-only contraceptive 2. Options discussed. 3. Follow up in: 4 weeks or as needed.

## 2013-04-22 ENCOUNTER — Encounter: Payer: Self-pay | Admitting: Obstetrics

## 2013-04-22 ENCOUNTER — Ambulatory Visit (INDEPENDENT_AMBULATORY_CARE_PROVIDER_SITE_OTHER): Payer: Medicaid Other

## 2013-04-22 ENCOUNTER — Ambulatory Visit (INDEPENDENT_AMBULATORY_CARE_PROVIDER_SITE_OTHER): Payer: Medicaid Other | Admitting: Obstetrics

## 2013-04-22 DIAGNOSIS — B9689 Other specified bacterial agents as the cause of diseases classified elsewhere: Secondary | ICD-10-CM

## 2013-04-22 DIAGNOSIS — N760 Acute vaginitis: Secondary | ICD-10-CM

## 2013-04-22 DIAGNOSIS — N949 Unspecified condition associated with female genital organs and menstrual cycle: Secondary | ICD-10-CM

## 2013-04-22 DIAGNOSIS — A499 Bacterial infection, unspecified: Secondary | ICD-10-CM

## 2013-04-22 MED ORDER — CLINDAMYCIN HCL 300 MG PO CAPS: 300.0000 mg | ORAL_CAPSULE | Freq: Three times a day (TID) | ORAL | Status: DC

## 2013-04-22 NOTE — Progress Notes (Signed)
Patient was having ultrasound and complained of vaginal pain and discharge.  She delivered 2 months ago and has resumed intercourse.  PE:      Pelvic:,            NEFG.  Vagina without lesions, discharge.  Vagina tender to palpation at introitus where she had a healed laceration.  A/P:  Painful healing vaginal laceration after intercourse.  No infection or swelling of area.           BV.  Clindamycin Rx.

## 2013-04-23 LAB — WET PREP BY MOLECULAR PROBE
Candida species: NEGATIVE
Gardnerella vaginalis: POSITIVE — AB

## 2013-04-24 ENCOUNTER — Encounter: Payer: Self-pay | Admitting: Obstetrics & Gynecology

## 2014-06-01 ENCOUNTER — Encounter: Payer: Self-pay | Admitting: Obstetrics

## 2014-11-17 ENCOUNTER — Other Ambulatory Visit: Payer: Self-pay | Admitting: Physician Assistant

## 2014-11-17 DIAGNOSIS — R102 Pelvic and perineal pain: Secondary | ICD-10-CM

## 2014-11-18 ENCOUNTER — Ambulatory Visit
Admission: RE | Admit: 2014-11-18 | Discharge: 2014-11-18 | Disposition: A | Discharge status: Home / Self Care | Payer: Medicaid Other | Source: Ambulatory Visit | Attending: Physician Assistant | Admitting: Physician Assistant

## 2014-11-18 ENCOUNTER — Other Ambulatory Visit: Payer: Self-pay | Admitting: Physician Assistant

## 2014-11-18 DIAGNOSIS — R102 Pelvic and perineal pain: Secondary | ICD-10-CM

## 2015-04-07 ENCOUNTER — Encounter (HOSPITAL_COMMUNITY): Payer: Self-pay | Admitting: *Deleted

## 2015-04-07 ENCOUNTER — Emergency Department (HOSPITAL_COMMUNITY)
Admission: EM | Admit: 2015-04-07 | Discharge: 2015-04-07 | Disposition: A | Discharge status: Home / Self Care | Payer: Medicaid Other | Attending: Emergency Medicine | Admitting: Emergency Medicine

## 2015-04-07 DIAGNOSIS — R1011 Right upper quadrant pain: Secondary | ICD-10-CM | POA: Insufficient documentation

## 2015-04-07 DIAGNOSIS — Z8659 Personal history of other mental and behavioral disorders: Secondary | ICD-10-CM | POA: Insufficient documentation

## 2015-04-07 DIAGNOSIS — R1013 Epigastric pain: Secondary | ICD-10-CM

## 2015-04-07 DIAGNOSIS — R102 Pelvic and perineal pain: Secondary | ICD-10-CM | POA: Insufficient documentation

## 2015-04-07 DIAGNOSIS — R1032 Left lower quadrant pain: Secondary | ICD-10-CM

## 2015-04-07 DIAGNOSIS — R51 Headache: Secondary | ICD-10-CM | POA: Insufficient documentation

## 2015-04-07 DIAGNOSIS — R110 Nausea: Secondary | ICD-10-CM | POA: Insufficient documentation

## 2015-04-07 DIAGNOSIS — Z8719 Personal history of other diseases of the digestive system: Secondary | ICD-10-CM | POA: Insufficient documentation

## 2015-04-07 DIAGNOSIS — Z793 Long term (current) use of hormonal contraceptives: Secondary | ICD-10-CM | POA: Insufficient documentation

## 2015-04-07 DIAGNOSIS — Z792 Long term (current) use of antibiotics: Secondary | ICD-10-CM | POA: Insufficient documentation

## 2015-04-07 LAB — URINALYSIS, ROUTINE W REFLEX MICROSCOPIC
Bilirubin Urine: NEGATIVE
GLUCOSE, UA: NEGATIVE mg/dL
HGB URINE DIPSTICK: NEGATIVE
Ketones, ur: NEGATIVE mg/dL
Nitrite: NEGATIVE
PH: 6 (ref 5.0–8.0)
PROTEIN: NEGATIVE mg/dL
Specific Gravity, Urine: 1.019 (ref 1.005–1.030)
Urobilinogen, UA: 0.2 mg/dL (ref 0.0–1.0)

## 2015-04-07 LAB — CBC
HCT: 38.4 % (ref 36.0–46.0)
Hemoglobin: 12.3 g/dL (ref 12.0–15.0)
MCH: 26.4 pg (ref 26.0–34.0)
MCHC: 32 g/dL (ref 30.0–36.0)
MCV: 82.4 fL (ref 78.0–100.0)
Platelets: 287 10*3/uL (ref 150–400)
RBC: 4.66 MIL/uL (ref 3.87–5.11)
RDW: 13.7 % (ref 11.5–15.5)
WBC: 8.3 10*3/uL (ref 4.0–10.5)

## 2015-04-07 LAB — COMPREHENSIVE METABOLIC PANEL
ALBUMIN: 3.7 g/dL (ref 3.5–5.0)
ALK PHOS: 61 U/L (ref 38–126)
ALT: 15 U/L (ref 14–54)
AST: 21 U/L (ref 15–41)
Anion gap: 8 (ref 5–15)
BUN: 9 mg/dL (ref 6–20)
CHLORIDE: 103 mmol/L (ref 101–111)
CO2: 25 mmol/L (ref 22–32)
CREATININE: 0.65 mg/dL (ref 0.44–1.00)
Calcium: 8.9 mg/dL (ref 8.9–10.3)
GFR calc Af Amer: >60 mL/min (ref >60)
GFR calc non Af Amer: >60 mL/min (ref >60)
GLUCOSE: 95 mg/dL (ref 65–99)
Potassium: 3.6 mmol/L (ref 3.5–5.1)
SODIUM: 136 mmol/L (ref 135–145)
Total Bilirubin: 0.6 mg/dL (ref 0.3–1.2)
Total Protein: 6.3 g/dL — ABNORMAL LOW (ref 6.5–8.1)

## 2015-04-07 LAB — URINE MICROSCOPIC-ADD ON

## 2015-04-07 LAB — WET PREP, GENITAL
Trich, Wet Prep: NONE SEEN
YEAST WET PREP: NONE SEEN

## 2015-04-07 LAB — LIPASE, BLOOD: Lipase: 27 U/L (ref 22–51)

## 2015-04-07 LAB — I-STAT BETA HCG BLOOD, ED (MC, WL, AP ONLY): I-stat hCG, quantitative: <5 m[IU]/mL (ref <5)

## 2015-04-07 MED ORDER — KETOROLAC TROMETHAMINE 30 MG/ML IJ SOLN
30.0000 mg | Freq: Once | INTRAMUSCULAR | Status: AC
  Administered 2015-04-07: 30 mg via INTRAVENOUS
  Filled 2015-04-07: qty 1

## 2015-04-07 MED ORDER — TRAMADOL HCL 50 MG PO TABS: 50.0000 mg | ORAL_TABLET | Freq: Four times a day (QID) | ORAL | Status: DC | PRN

## 2015-04-07 MED ORDER — LIDOCAINE HCL (PF) 1 % IJ SOLN
1.7000 mL | Freq: Once | INTRAMUSCULAR | Status: AC
  Administered 2015-04-07: 1.7 mL

## 2015-04-07 MED ORDER — LIDOCAINE HCL (PF) 1 % IJ SOLN
INTRAMUSCULAR | Status: AC
  Administered 2015-04-07: 1.7 mL
  Filled 2015-04-07: qty 5

## 2015-04-07 MED ORDER — AZITHROMYCIN 250 MG PO TABS
1000.0000 mg | ORAL_TABLET | Freq: Once | ORAL | Status: AC
  Administered 2015-04-07: 1000 mg via ORAL
  Filled 2015-04-07: qty 4

## 2015-04-07 MED ORDER — CEFTRIAXONE SODIUM 250 MG IJ SOLR
250.0000 mg | Freq: Once | INTRAMUSCULAR | Status: AC
  Administered 2015-04-07: 250 mg via INTRAMUSCULAR
  Filled 2015-04-07: qty 250

## 2015-04-07 MED ORDER — OMEPRAZOLE 20 MG PO CPDR: 20.0000 mg | DELAYED_RELEASE_CAPSULE | Freq: Two times a day (BID) | ORAL | Status: DC

## 2015-04-07 NOTE — ED Provider Notes (Signed)
CSN: [id number]     Arrival date & time 04/07/15  0813 History   First MD Initiated Contact with Patient 04/07/15 [id number]     Chief Complaint  Patient presents with  . Abdominal Pain  . Headache     (Consider location/radiation/quality/duration/timing/severity/associated sxs/prior Treatment) HPI   29 year old female with history of depression who presents for evaluation of abd pain and headache. Patient states for the past week she has had pain to both the epigastric and her low abdomen. She described her epigastric pain as a burning sensation, sharp, worsened after eating with occasional nausea and she vomited twice yesterday. Her vomitus is nonbloody nonbilious. Eating spicy food makes it worse. Has not had a bowel movement since yesterday. Pain is currently 5 out of 10, nonradiating. She also endorsed burning on urination for the past several days. Denies urinary frequency or urgency.  Also endorsed left flank pain. Also endorsed having a bilateral temporal headache for the same duration. Headache is mild. No visual changes, no neck stiffness, rash. She denies having any chest pain or shortness of breath. Denies hematuria, vaginal bleeding or vaginal discharge. She denies any bowel discomfort. Patient has not been sexually active for the past 2 years. Her last menstrual period was August 17.     Past Medical History  Diagnosis Date  . No pertinent past medical history   . Depression   . Teeth problem     cracked molars, pt unsure if just right side or both sides   Past Surgical History  Procedure Laterality Date  . No past surgeries     No family history on file. Social History  Substance Use Topics  . Smoking status: Never Smoker   . Smokeless tobacco: Never Used  . Alcohol Use: No   OB History    Gravida Para Term Preterm AB TAB SAB Ectopic Multiple Living   0 3 0 3 0 0 1     Review of Systems  All other systems reviewed and are negative.     Allergies  Review  of patient's allergies indicates no known allergies.  Home Medications   Prior to Admission medications   Medication Sig Start Date End Date Taking? Authorizing Provider  clindamycin (CLEOCIN) 300 MG capsule Take 1 capsule (300 mg total) by mouth 3 (three) times daily. 04/22/13   Brock Bad, MD  ibuprofen (ADVIL,MOTRIN) 600 MG tablet Take 1 tablet (600 mg total) by mouth every 6 (six) hours as needed for pain. 02/21/13   Brock Bad, MD  norethindrone (MICRONOR,CAMILA,ERRIN) 0.35 MG tablet Take 1 tablet (0.35 mg total) by mouth daily. 03/10/13   Brock Bad, MD  oxyCODONE-acetaminophen (PERCOCET) 10-325 MG per tablet Take 1 tablet by mouth every 4 (four) hours as needed for pain. 04/07/13   Brock Bad, MD  Prenatal Vit-Fe Fumarate-FA (PRENATAL MULTIVITAMIN) TABS Take 1 tablet by mouth daily.    Historical Provider, MD   BP 124/78 mmHg  Pulse 80  Temp(Src) 98 F (36.7 C) (Oral)  Resp 16  Ht  (1.499 m)  Wt 127 lb (57.607 kg)  BMI 25.64 kg/m2  SpO2 98%  LMP 03/17/2015 (Approximate) Physical Exam  Constitutional: She is oriented to person, place, and time. She appears well-developed and well-nourished. No distress.  HENT:  Head: Atraumatic.  Eyes: Conjunctivae are normal.  Neck: Neck supple.  No nuchal rigidity  Cardiovascular: Normal rate and regular rhythm.   Pulmonary/Chest: Effort normal and breath sounds normal.  Abdominal: Soft. There is tenderness (right upper quadrant tenderness on palpation without guarding or rebound tenderness negative Murphy sign. Suprapubic tenderness on palpation without guarding or rebound tenderness. No pain at McBurney's point. Left CVA tenderness.).  Genitourinary:  Chaperone present during exam. No inguinal lymphadenopathy or inguinal hernia noted. Normal external genitalia. Normal vaginal vault with minimal tenderness with speculum insertion. Mild white Flagyl discharge noted. Close cervical os free of lesion or rash. On  bimanual examination, left adnexal tenderness without obvious mass. No right adnexal tenderness and no cervical motion tenderness.  Neurological: She is alert and oriented to person, place, and time. She has normal strength. No cranial nerve deficit or sensory deficit. GCS eye subscore is 4. GCS verbal subscore is 5. GCS motor subscore is 6.  Skin: No rash noted.  Psychiatric: She has a normal mood and affect.  Nursing note and vitals reviewed.   ED Course  Procedures (including critical care time)  Patient here with epigastric abdominal pain that likely suggest gastric reflux. She does have some right upper quadrant abdominal tenderness the pain is minimal at this time therefore low suspicion for acute gallbladder etiology.  Patient also complaining of dysuria, having left flank pain and headache likely suggestive of pyelonephritis. She is afebrile with stable normal vital sign. I offer pain medication but patient declined. She is not sexually active for the past 2 years and I have low suspicion for OB/GYN etiology at this time.  Her headache is mild with no bowel nuchal rigidity concerning for meningitis.   10:22 AM UA shows no significant signs to suggest an urinary tract infection. On pelvic examination patient does have left adnexal tenderness but she has no significant sexual history concerning for STD at this time. GC and Chlamydia culture pending.  Labs Review Labs Reviewed  WET PREP, GENITAL - Abnormal; Notable for the following:    Clue Cells Wet Prep HPF POC FEW (*)    WBC, Wet Prep HPF POC MODERATE (*)    All other components within normal limits  COMPREHENSIVE METABOLIC PANEL - Abnormal; Notable for the following:    Total Protein 6.3 (*)    All other components within normal limits  URINALYSIS, ROUTINE W REFLEX MICROSCOPIC (NOT AT [facility]) - Abnormal; Notable for the following:    APPearance CLOUDY (*)    Leukocytes, UA SMALL (*)    All other components within normal limits   URINE MICROSCOPIC-ADD ON - Abnormal; Notable for the following:    Squamous Epithelial / LPF MANY (*)    Bacteria, UA MANY (*)    All other components within normal limits  LIPASE, BLOOD  CBC  I-STAT BETA HCG BLOOD, ED ([facility], [facility], [facility] ONLY)  GC/CHLAMYDIA PROBE AMP ([place]) NOT AT [facility]      MDM   Final diagnoses:  Epigastric abdominal pain  Combined abdominal and pelvic pain, left    BP 122/71 mmHg  Pulse 70  Temp(Src) 98 F (36.7 C) (Oral)  Resp 16  Ht 4\' 11"  (1.499 m)  Wt 127 lb (57.607 kg)  BMI 25.64 kg/m2  SpO2 97%  LMP 03/17/2015 (Approximate)     Fayrene Helper, PA-C 04/07/15 1209  Elwin Mocha, MD 04/07/15 1601

## 2015-04-07 NOTE — Discharge Instructions (Signed)
You have pain to your abdomen, which may relate to heart burn.  Take prilosec 30 minutes before each meal.  Avoid spicy food, alcohol, chocolate or mint as it can worsen your pain.  Do not take ibuprofen/advil/aleve on a regular basis.  You are also being evaluated for pelvic infection.  If you tested positive for infection in the next few days we will call and notify to you.  Dolor abdominal (Abdominal Pain) El dolor puede tener muchas causas. Normalmente la causa del dolor abdominal no es una enfermedad y mejorar sin TEFL teacher. Frecuentemente puede controlarse y tratarse en casa. Su mdico le Medical salScientist, clinical (histocompatibility and immunogenetics)epresentative examen fsico y posiblemente solicite anlisis de sangre y radiografas para ayudar a Chief Strategy Officer la gravedad de su dolor. Sin embargo, en [employer], debe transcurrir ms tiempo antes de que se pueda Clinical research associate una causa evidente del dolor. Antes de llegar a ese punto, es posible que su mdico no sepa si necesita ms pruebas o un tratamiento ms profundo. INSTRUCCIONES PARA EL CUIDADO EN EL HOGAR  Est atento al dolor para ver si hay cambios. Las siguientes indicaciones ayudarn a Architectural technologist que pueda sentir:  [place] solo medicamentos de venta libre o recetados, segn las indicaciones del mdico.  No tome laxantes a menos que se lo haya indicado su mdico.  Pruebe con Neomia Dear dieta lquida absoluta (caldo, t o agua) segn se lo indique su mdico. Introduzca gradualmente una dieta normal, segn su tolerancia. SOLICITE ATENCIN MDICA SI:  Tiene dolor abdominal sin explicacin.  Tiene dolor abdominal relacionado con nuseas o diarrea.  Tiene dolor cuando orina o defeca.  Experimenta dolor abdominal que lo despierta de noche.  Tiene dolor abdominal que empeora o mejora cuando come alimentos.  Tiene dolor abdominal que empeora cuando come alimentos grasosos.  Tiene fiebre. SOLICITE ATENCIN MDICA DE INMEDIATO SI:   El dolor no desaparece en un plazo mximo de  2horas.  No deja de (vomitar).  El Engineer, mining se siente solo en partes del abdomen, como el lado derecho o la parte inferior izquierda del abdomen.  Evaca materia fecal sanguinolenta o negra, de aspecto alquitranado. ASEGRESE DE QUE:  Comprende estas instrucciones.  Controlar su afeccin.  Recibir ayuda de inmediato si no mejora o si empeora. Document Released: 07/17/2005 Document Revised: 07/22/2013 [facility] Patient Information 2015 [place], [place]. This information is not intended to replace advice given to you by your health care provider. Make sure you discuss any questions you have with your health care provider.

## 2015-04-07 NOTE — ED Notes (Signed)
Pt states that she has had a headache for 4 days. Pt also reports mid abdominal pain and vomiting x2 since yesterday. Pt states that she has burning with urination as well. Pt states that she has been taking ibuprofen with some relief.

## 2015-04-08 LAB — GC/CHLAMYDIA PROBE AMP (~~LOC~~) NOT AT ARMC
Chlamydia: NEGATIVE
Neisseria Gonorrhea: NEGATIVE

## 2015-05-17 ENCOUNTER — Emergency Department (HOSPITAL_COMMUNITY)
Admission: EM | Admit: 2015-05-17 | Discharge: 2015-05-17 | Disposition: A | Discharge status: Home / Self Care | Payer: Worker's Compensation | Attending: Emergency Medicine | Admitting: Emergency Medicine

## 2015-05-17 ENCOUNTER — Encounter (HOSPITAL_COMMUNITY): Payer: Self-pay | Admitting: Emergency Medicine

## 2015-05-17 ENCOUNTER — Emergency Department (HOSPITAL_COMMUNITY): Payer: Worker's Compensation

## 2015-05-17 DIAGNOSIS — S59902A Unspecified injury of left elbow, initial encounter: Secondary | ICD-10-CM | POA: Insufficient documentation

## 2015-05-17 DIAGNOSIS — Y9289 Other specified places as the place of occurrence of the external cause: Secondary | ICD-10-CM | POA: Diagnosis not present

## 2015-05-17 DIAGNOSIS — Z8659 Personal history of other mental and behavioral disorders: Secondary | ICD-10-CM | POA: Insufficient documentation

## 2015-05-17 DIAGNOSIS — Z79899 Other long term (current) drug therapy: Secondary | ICD-10-CM | POA: Insufficient documentation

## 2015-05-17 DIAGNOSIS — Y990 Civilian activity done for income or pay: Secondary | ICD-10-CM | POA: Diagnosis not present

## 2015-05-17 DIAGNOSIS — S6992XA Unspecified injury of left wrist, hand and finger(s), initial encounter: Secondary | ICD-10-CM | POA: Diagnosis not present

## 2015-05-17 DIAGNOSIS — Z8719 Personal history of other diseases of the digestive system: Secondary | ICD-10-CM | POA: Diagnosis not present

## 2015-05-17 DIAGNOSIS — Z792 Long term (current) use of antibiotics: Secondary | ICD-10-CM | POA: Diagnosis not present

## 2015-05-17 DIAGNOSIS — Y93E5 Activity, floor mopping and cleaning: Secondary | ICD-10-CM | POA: Diagnosis not present

## 2015-05-17 DIAGNOSIS — S5012XA Contusion of left forearm, initial encounter: Secondary | ICD-10-CM | POA: Diagnosis not present

## 2015-05-17 DIAGNOSIS — W01198A Fall on same level from slipping, tripping and stumbling with subsequent striking against other object, initial encounter: Secondary | ICD-10-CM | POA: Insufficient documentation

## 2015-05-17 DIAGNOSIS — S59912A Unspecified injury of left forearm, initial encounter: Secondary | ICD-10-CM | POA: Diagnosis present

## 2015-05-17 MED ORDER — IBUPROFEN 800 MG PO TABS: 800.0000 mg | ORAL_TABLET | Freq: Three times a day (TID) | ORAL | Status: DC

## 2015-05-17 NOTE — ED Notes (Signed)
Pt tripped and fell on hard floor today. C.o pain to L forearm/elbow. Difficulty moving extremity. PMS intact.

## 2015-05-17 NOTE — ED Provider Notes (Signed)
CSN: [id number]     Arrival date & time 05/17/15  2034 History  By signing my name below, I, Tracy [name], attest that this documentation has been prepared under the direction and in the presence of Langston Masker, PA-C Electronically Signed: Soijett [name], ED Scribe. 05/17/2015. 9:26 PM.   Chief Complaint  Patient presents with  . Arm Injury     The history is provided by the patient. No language interpreter was used.    Tracy [name] Tracy [name] is a 29 y.o. female who presents to the Emergency Department complaining of left forearm/elbow injury onset today. 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 She reports that she hit her left arm on during the fall. She denies cutting her arm during the incident. she states that she only has localized pain.  Pt is having associated symptoms of joint swelling and tingling in left fingers. She notes that she has not tried any medications for the relief of her symptoms. She denies color change, wound, rash, and any other symptoms.    Past Medical History  Diagnosis Date  . No pertinent past medical history   . Depression   . Teeth problem     cracked molars, pt unsure if just right side or both sides   Past Surgical History  Procedure Laterality Date  . No past surgeries     No family history on file. Social History  Substance Use Topics  . Smoking status: Never Smoker   . Smokeless tobacco: Never Used  . Alcohol Use: No   OB History    Gravida Para Term Preterm AB TAB SAB Ectopic Multiple Living   0 3 0 3 0 0 1     Review of Systems  Musculoskeletal: Positive for joint swelling and arthralgias. Negative for gait problem.  Skin: Negative for color change, rash and wound.  Neurological:       Tingling in left fingers      Allergies  Review of patient's allergies indicates no known allergies.  Home Medications   Prior to Admission medications   Medication Sig Start  Date End Date Taking? Authorizing Provider  clindamycin (CLEOCIN) 300 MG capsule Take 1 capsule (300 mg total) by mouth 3 (three) times daily. 04/22/13   Brock Bad, MD  norethindrone (MICRONOR,CAMILA,ERRIN) 0.35 MG tablet Take 1 tablet (0.35 mg total) by mouth daily. 03/10/13   Brock Bad, MD  omeprazole (PRILOSEC) 20 MG capsule Take 1 capsule (20 mg total) by mouth 2 (two) times daily before a meal. 04/07/15   Fayrene Helper, PA-C  oxyCODONE-acetaminophen (PERCOCET) 10-325 MG per tablet Take 1 tablet by mouth every 4 (four) hours as needed for pain. 04/07/13   Brock Bad, MD  Prenatal Vit-Fe Fumarate-FA (PRENATAL MULTIVITAMIN) TABS Take 1 tablet by mouth daily.    Historical Provider, MD  traMADol (ULTRAM) 50 MG tablet Take 1 tablet (50 mg total) by mouth every 6 (six) hours as needed for moderate pain. 04/07/15   Fayrene Helper, PA-C   BP 113/76 mmHg  Pulse 88  Temp(Src) 98.7 F (37.1 C) (Oral)  Resp 16  Wt 127 lb (57.607 kg)  SpO2 97%  LMP 04/28/2015 Physical Exam  Constitutional: She is oriented to person, place, and time. She appears well-developed and well-nourished. No distress.  HENT:  Head: Normocephalic and atraumatic.  Eyes: EOM are normal.  Neck: Neck supple.  Cardiovascular: Normal rate.  Pulmonary/Chest: Effort normal. No respiratory distress.  Musculoskeletal: Normal range of motion.       Left elbow: She exhibits swelling. Tenderness found.       Left wrist: She exhibits tenderness.  Pain with ROM of left wrist and left elbow. Swelling of left elbow to mid forearm. NVI and neurosensory intact.  Neurological: She is alert and oriented to person, place, and time.  Skin: Skin is warm and dry.  Psychiatric: She has a normal mood and affect. Her behavior is normal.  Nursing note and vitals reviewed.   ED Course  Procedures (including critical care time) DIAGNOSTIC STUDIES: Oxygen Saturation is 97% on RA, nl by my interpretation.    COORDINATION OF CARE: 9:25 PM  Discussed treatment plan with pt at bedside which includes left forearm xray and left elbow xray and pt agreed to plan.    Labs Review Labs Reviewed - No data to display  Imaging Review Dg Elbow Complete Left  05/17/2015  CLINICAL DATA:  Fall at work with left elbow pain, initial encounter EXAM: LEFT ELBOW - COMPLETE 3+ VIEW COMPARISON:  None. FINDINGS: There is no evidence of fracture, dislocation, or joint effusion. There is no evidence of arthropathy or other focal bone abnormality. Soft tissues are unremarkable. IMPRESSION: No acute abnormality noted. Electronically Signed   By: Alcide CleverMark  Lukens M.D.   On: 05/17/2015 21:29   Dg Forearm Left  05/17/2015  CLINICAL DATA:  Larey SeatFell at work landing on LEFT arm, LEFT forearm pain EXAM: LEFT FOREARM - 2 VIEW COMPARISON:  None FINDINGS: Osseous mineralization normal. Joint spaces preserved. No fracture, dislocation, or bone destruction. IMPRESSION: Normal exam. Electronically Signed   By: Ulyses SouthwardMark  Boles M.D.   On: 05/17/2015 21:28   I have personally reviewed and evaluated these images as part of my medical decision-making.   EKG Interpretation None      MDM   Final diagnoses:  Contusion of left forearm, initial encounter    Ibuprofen avs   Elson AreasLeslie K Setsuko Robins, PA-C 05/17/15 2148  Rolan BuccoMelanie Belfi, MD 05/17/15 2324

## 2015-05-17 NOTE — Discharge Instructions (Signed)

## 2015-05-17 NOTE — ED Notes (Signed)
Patient is alert and orientedx4.  Patient was explained discharge instructions and they understood them with no questions.   

## 2015-06-22 ENCOUNTER — Encounter (HOSPITAL_COMMUNITY): Payer: Self-pay | Admitting: *Deleted

## 2015-06-22 ENCOUNTER — Other Ambulatory Visit: Payer: Self-pay

## 2015-06-22 ENCOUNTER — Emergency Department (HOSPITAL_COMMUNITY): Payer: Self-pay

## 2015-06-22 ENCOUNTER — Emergency Department (HOSPITAL_COMMUNITY): Payer: Medicaid Other

## 2015-06-22 ENCOUNTER — Emergency Department (HOSPITAL_COMMUNITY)
Admission: EM | Admit: 2015-06-22 | Discharge: 2015-06-22 | Disposition: A | Discharge status: Home / Self Care | Payer: Self-pay | Attending: Emergency Medicine | Admitting: Emergency Medicine

## 2015-06-22 DIAGNOSIS — F17210 Nicotine dependence, cigarettes, uncomplicated: Secondary | ICD-10-CM | POA: Insufficient documentation

## 2015-06-22 DIAGNOSIS — Z8659 Personal history of other mental and behavioral disorders: Secondary | ICD-10-CM | POA: Insufficient documentation

## 2015-06-22 DIAGNOSIS — W19XXXA Unspecified fall, initial encounter: Secondary | ICD-10-CM | POA: Insufficient documentation

## 2015-06-22 DIAGNOSIS — S299XXA Unspecified injury of thorax, initial encounter: Secondary | ICD-10-CM | POA: Insufficient documentation

## 2015-06-22 DIAGNOSIS — Y9289 Other specified places as the place of occurrence of the external cause: Secondary | ICD-10-CM | POA: Insufficient documentation

## 2015-06-22 DIAGNOSIS — Y999 Unspecified external cause status: Secondary | ICD-10-CM | POA: Insufficient documentation

## 2015-06-22 DIAGNOSIS — Y9389 Activity, other specified: Secondary | ICD-10-CM | POA: Insufficient documentation

## 2015-06-22 DIAGNOSIS — S3991XA Unspecified injury of abdomen, initial encounter: Secondary | ICD-10-CM | POA: Insufficient documentation

## 2015-06-22 DIAGNOSIS — Z79899 Other long term (current) drug therapy: Secondary | ICD-10-CM | POA: Insufficient documentation

## 2015-06-22 DIAGNOSIS — Z792 Long term (current) use of antibiotics: Secondary | ICD-10-CM | POA: Insufficient documentation

## 2015-06-22 DIAGNOSIS — Z791 Long term (current) use of non-steroidal anti-inflammatories (NSAID): Secondary | ICD-10-CM | POA: Insufficient documentation

## 2015-06-22 LAB — BASIC METABOLIC PANEL
Anion gap: 6 (ref 5–15)
BUN: 14 mg/dL (ref 6–20)
CALCIUM: 9.2 mg/dL (ref 8.9–10.3)
CO2: 25 mmol/L (ref 22–32)
CREATININE: 0.71 mg/dL (ref 0.44–1.00)
Chloride: 107 mmol/L (ref 101–111)
Glucose, Bld: 82 mg/dL (ref 65–99)
Potassium: 3.9 mmol/L (ref 3.5–5.1)
Sodium: 138 mmol/L (ref 135–145)

## 2015-06-22 LAB — CBC
HCT: 38.7 % (ref 36.0–46.0)
Hemoglobin: 12.4 g/dL (ref 12.0–15.0)
MCH: 26.8 pg (ref 26.0–34.0)
MCHC: 32 g/dL (ref 30.0–36.0)
MCV: 83.6 fL (ref 78.0–100.0)
PLATELETS: 311 10*3/uL (ref 150–400)
RBC: 4.63 MIL/uL (ref 3.87–5.11)
RDW: 13.8 % (ref 11.5–15.5)
WBC: 8.1 10*3/uL (ref 4.0–10.5)

## 2015-06-22 LAB — I-STAT TROPONIN, ED: TROPONIN I, POC: 0 ng/mL (ref 0.00–0.08)

## 2015-06-22 MED ORDER — IBUPROFEN 800 MG PO TABS: 800.0000 mg | ORAL_TABLET | Freq: Three times a day (TID) | ORAL | Status: DC

## 2015-06-22 MED ORDER — DIAZEPAM 5 MG PO TABS
5.0000 mg | ORAL_TABLET | Freq: Once | ORAL | Status: AC
  Administered 2015-06-22: 5 mg via ORAL
  Filled 2015-06-22: qty 1

## 2015-06-22 MED ORDER — KETOROLAC TROMETHAMINE 60 MG/2ML IM SOLN
60.0000 mg | Freq: Once | INTRAMUSCULAR | Status: AC
  Administered 2015-06-22: 60 mg via INTRAMUSCULAR
  Filled 2015-06-22: qty 2

## 2015-06-22 NOTE — ED Notes (Signed)
Pt c/o mid CP that radiates to mid upper back onset x 1 wk intermittent, pt c/o SOB worse with deep inspiration, pt denies n/v/d, pt A&O x4

## 2015-06-22 NOTE — ED Notes (Signed)
Pt eating potato chips in waiting room. No apparent distress.

## 2015-06-22 NOTE — ED Provider Notes (Signed)
CSN: [id number]     Arrival date & time 06/22/15  1255 History   First MD Initiated Contact with Patient 06/22/15 1614     Chief Complaint  Patient presents with  . Chest Pain   HPI  Veronnica ArguelloMartine is a 29 y.o. F presenting with a 1 week history of left sided chest pain s/p fall. She describes the pain as 8/10 pain scale, radiating to her left breast and back, "pinching", and constant. She denies AMS, fevers, HA, SOB, abdominal pain, change in bowel/bladder function, estrogen birth control use, alleviation attempts.     Past Medical History  Diagnosis Date  . No pertinent past medical history   . Depression   . Teeth problem     cracked molars, pt unsure if just right side or both sides   Past Surgical History  Procedure Laterality Date  . No past surgeries     No family history on file. Social History  Substance Use Topics  . Smoking status: Current Some Day Smoker    Types: Cigarettes  . Smokeless tobacco: Never Used  . Alcohol Use: No   OB History    Gravida Para Term Preterm AB TAB SAB Ectopic Multiple Living   0 3 0 3 0 0 1     Review of Systems  Ten systems are reviewed and are negative for acute change except as noted in the HPI   Allergies  Review of patient's allergies indicates no known allergies.  Home Medications   Prior to Admission medications   Medication Sig Start Date End Date Taking? Authorizing Provider  clindamycin (CLEOCIN) 300 MG capsule Take 1 capsule (300 mg total) by mouth 3 (three) times daily. 04/22/13   Brock Bad, MD  ibuprofen (ADVIL,MOTRIN) 800 MG tablet Take 1 tablet (800 mg total) by mouth 3 (three) times daily. 05/17/15   Elson Areas, PA-C  norethindrone (MICRONOR,CAMILA,ERRIN) 0.35 MG tablet Take 1 tablet (0.35 mg total) by mouth daily. 03/10/13   Brock Bad, MD  omeprazole (PRILOSEC) 20 MG capsule Take 1 capsule (20 mg total) by mouth 2 (two) times daily before a meal. 04/07/15   Fayrene Helper, PA-C   oxyCODONE-acetaminophen (PERCOCET) 10-325 MG per tablet Take 1 tablet by mouth every 4 (four) hours as needed for pain. 04/07/13   Brock Bad, MD  Prenatal Vit-Fe Fumarate-FA (PRENATAL MULTIVITAMIN) TABS Take 1 tablet by mouth daily.    Historical Provider, MD  traMADol (ULTRAM) 50 MG tablet Take 1 tablet (50 mg total) by mouth every 6 (six) hours as needed for moderate pain. 04/07/15   Fayrene Helper, PA-C   BP 113/76 mmHg  Pulse 82  Temp(Src) 99 F (37.2 C) (Oral)  Resp 16  Ht  (1.6 m)  Wt 57.607 kg  BMI 22.50 kg/m2  SpO2 99%  LMP 05/31/2015  Breastfeeding? No Physical Exam  Constitutional: She appears well-developed and well-nourished. No distress.  HENT:  Head: Normocephalic and atraumatic.  Mouth/Throat: Oropharynx is clear and moist. No oropharyngeal exudate.  Eyes: Conjunctivae are normal. Pupils are equal, round, and reactive to light. Right eye exhibits no discharge. Left eye exhibits no discharge. No scleral icterus.  Neck: Normal range of motion. No tracheal deviation present.  Cardiovascular: Normal rate, regular rhythm, normal heart sounds and intact distal pulses.  Exam reveals no gallop and no friction rub.   No murmur heard. Pulmonary/Chest: Effort normal and breath sounds normal. No respiratory distress. She has no wheezes. She has  no rales. She exhibits tenderness.  Left flank pain  Abdominal: Soft. Bowel sounds are normal. She exhibits no distension and no mass. There is no tenderness. There is no rebound and no guarding.  Musculoskeletal: Normal range of motion. She exhibits no edema.  Lymphadenopathy:    She has no cervical adenopathy.  Neurological: She is alert. Coordination normal.  Skin: Skin is warm and dry. No rash noted. She is not diaphoretic. No erythema.  Psychiatric: She has a normal mood and affect. Her behavior is normal.  Nursing note and vitals reviewed.   ED Course  Procedures (including critical care time) Labs Review Labs Reviewed   BASIC METABOLIC PANEL  CBC  I-STAT TROPOININ, ED    Imaging Review Dg Chest 2 View  06/22/2015  CLINICAL DATA:  Fall 1 week ago with left chest pain. Initial encounter. EXAM: CHEST  2 VIEW COMPARISON:  None. FINDINGS: Normal heart size and mediastinal contours. No acute infiltrate or edema. No effusion or pneumothorax. No acute osseous findings. IMPRESSION: Negative chest. Electronically Signed   By: Marnee SpringJonathon  Watts M.D.   On: 06/22/2015 13:53   Dg Ribs Unilateral Left  06/22/2015  CLINICAL DATA:  Chest pain that radiates to mid upper back. Generalize left rib pain after fall 1 week ago EXAM: LEFT RIBS - 2 VIEW COMPARISON:  06/22/2015 FINDINGS: No fracture or other bone lesions are seen involving the ribs. IMPRESSION: Negative. Electronically Signed   By: Signa Kellaylor  Stroud M.D.   On: 06/22/2015 17:57   I have personally reviewed and evaluated these images and lab results as part of my medical decision-making.   EKG Interpretation   Date/Time:  Tuesday June 22 2015 13:03:04 EST Ventricular Rate:  91 PR Interval:  118 QRS Duration: 70 QT Interval:  352 QTC Calculation: 432 R Axis:   46 Text Interpretation:  Normal sinus rhythm Normal ECG No previous ECGs  available Confirmed by NGUYEN, EMILY ([id number]) on 06/22/2015 5:55:38 PM      MDM   Final diagnoses:  Fall   Patient non-toxic appearing, VSS. No evidence of trauma on physical exam other that left flank pain. Will x-ray ribs specifically.   Negative xrays. Patient feeling better on toradol.   Dr. Cyndie ChimeNguyen to see patient as well.   Patient may be safely discharged home. Discussed reasons for return. Patient to follow-up with primary care provider within one week. Also referred to gynecologist for breast pain. Patient in understanding and agreement with the plan.  Melton KrebsSamantha Nicole Alyza Artiaga, PA-C 06/29/15 [phone]  Leta BaptistEmily Roe Nguyen, MD 07/02/15 [phone]

## 2015-06-22 NOTE — Discharge Instructions (Signed)
Ms. Reginia NaasrguelloMartine,  Nice meeting you! Please follow-up with a primary care provider within one week. If your breast pain continues, see the [facility] (information attached). Feel better soon!  S. Lane HackerNicole Jameire Kouba, PA-C

## 2015-08-01 NOTE — L&D Delivery Note (Signed)
Delivery Note Pushed well to crowning after about an hour.   At 1:25 PM a viable and healthy female was delivered via Vaginal, Spontaneous Delivery (Presentation: OA, rotated from OP spontaneously to OA ).  APGAR: 4, 9; weight  7+13.   Placenta status: spontaneous and grossly intact with 3VC, .  Cord:  with the following complications: Shoulder dystocia.  Cord pH: venous cord gas pending  Head delivered slowly, LOA Chin was slow to emerge Attempted delivery of anterior shoulder without success. McRoberts already being done. Staff notified and arrived Able to grasp posterior axilla which moved a bit but anterior shoulder would not move. Attempted corkscrew without success. Able to finally grasp anterior axilla and wiggled shoulders out with minimal traction on neck.  Infant handed to team after cord clamped and cut.   Anesthesia:  epidural Episiotomy:  none Lacerations:  Small perineal 2nd degree repaired in usual fashion Suture Repair: 3.0 monocryl Est. Blood Loss (mL):    Mom to postpartum.  Baby to Couplet care / Skin to Skin.  [facility] LLCWILLIAMS,Katelyn Broadnax 06/24/2016, 1:45 PM

## 2015-12-14 ENCOUNTER — Ambulatory Visit (INDEPENDENT_AMBULATORY_CARE_PROVIDER_SITE_OTHER): Payer: Medicaid Other | Admitting: Obstetrics

## 2015-12-14 ENCOUNTER — Encounter: Payer: Self-pay | Admitting: Obstetrics

## 2015-12-14 ENCOUNTER — Encounter: Payer: Medicaid Other | Admitting: Certified Nurse Midwife

## 2015-12-14 VITALS — BP 108/71 | HR 95 | Temp 98.7°F | Wt 144.0 lb

## 2015-12-14 DIAGNOSIS — Z3491 Encounter for supervision of normal pregnancy, unspecified, first trimester: Secondary | ICD-10-CM | POA: Diagnosis not present

## 2015-12-14 LAB — POCT URINALYSIS DIPSTICK
Bilirubin, UA: NEGATIVE
Blood, UA: NEGATIVE
GLUCOSE UA: NEGATIVE
Ketones, UA: NEGATIVE
NITRITE UA: NEGATIVE
PROTEIN UA: NEGATIVE
Spec Grav, UA: 1.02
UROBILINOGEN UA: NEGATIVE
pH, UA: 6

## 2015-12-14 MED ORDER — PRENATE MINI 18-0.6-0.4-350 MG PO CAPS: 1.0000 | ORAL_CAPSULE | Freq: Every day | ORAL | Status: DC

## 2015-12-14 NOTE — Progress Notes (Signed)
Subjective:    Tracy [name] is being seen today for her first obstetrical visit.  This is a planned pregnancy. She is at 70101w3d gestation. Her obstetrical history is significant for smoker. Relationship with FOB: significant other, living together. Patient does intend to breast feed. Pregnancy history fully reviewed.  The information documented in the HPI was reviewed and verified.  Menstrual History: OB History    Gravida Para Term Preterm AB TAB SAB Ectopic Multiple Living   5 1 1  0 3 0 3 0 0 1       Patient's last menstrual period was 10/02/2015 (exact date).    Past Medical History  Diagnosis Date  . No pertinent past medical history   . Depression   . Teeth problem     cracked molars, pt unsure if just right side or both sides    Past Surgical History  Procedure Laterality Date  . No past surgeries       (Not in a hospital admission) No Known Allergies  Social History  Substance Use Topics  . Smoking status: Current Some Day Smoker    Types: Cigarettes  . Smokeless tobacco: Never Used  . Alcohol Use: No    No family history on file.   Review of Systems Constitutional: negative for weight loss Gastrointestinal: negative for vomiting Genitourinary:negative for genital lesions and vaginal discharge and dysuria Musculoskeletal:negative for back pain Behavioral/Psych: negative for abusive relationship, depression, illegal drug usage and tobacco use    Objective:    BP 108/71 mmHg  Pulse 95  Temp(Src) 98.7 F (37.1 C)  Wt 144 lb (65.318 kg)  LMP 10/02/2015 (Exact Date) General Appearance:    Alert, cooperative, no distress, appears stated age  Head:    Normocephalic, without obvious abnormality, atraumatic  Eyes:    PERRL, conjunctiva/corneas clear, EOM's intact, fundi    benign, both eyes  Ears:    Normal TM's and external ear canals, both ears  Nose:   Nares normal, septum midline, mucosa normal, no drainage    or sinus tenderness  Throat:   Lips,  mucosa, and tongue normal; teeth and gums normal  Neck:   Supple, symmetrical, trachea midline, no adenopathy;    thyroid:  no enlargement/tenderness/nodules; no carotid   bruit or JVD  Back:     Symmetric, no curvature, ROM normal, no CVA tenderness  Lungs:     Clear to auscultation bilaterally, respirations unlabored  Chest Wall:    No tenderness or deformity   Heart:    Regular rate and rhythm, S1 and S2 normal, no murmur, rub   or gallop  Breast Exam:    No tenderness, masses, or nipple abnormality  Abdomen:     Soft, non-tender, bowel sounds active all four quadrants,    no masses, no organomegaly  Genitalia:    Normal female without lesion, discharge or tenderness  Extremities:   Extremities normal, atraumatic, no cyanosis or edema  Pulses:   2+ and symmetric all extremities  Skin:   Skin color, texture, turgor normal, no rashes or lesions  Lymph nodes:   Cervical, supraclavicular, and axillary nodes normal  Neurologic:   CNII-XII intact, normal strength, sensation and reflexes    throughout      Lab Review Urine pregnancy test Labs reviewed yes Radiologic studies reviewed no  Assessment:    Pregnancy at 62101w3d weeks    Plan:     Prenatal vitamins.  Counseling provided regarding continued use of seat belts, cessation of alcohol consumption,  smoking or use of illicit drugs; infection precautions i.e., influenza/TDAP immunizations, toxoplasmosis,CMV, parvovirus, listeria and varicella; workplace safety, exercise during pregnancy; routine dental care, safe medications, sexual activity, hot tubs, saunas, pools, travel, caffeine use, fish and methlymercury, potential toxins, hair treatments, varicose veins Weight gain recommendations per IOM guidelines reviewed: underweight/BMI< 18.5--> gain 28 - 40 lbs; normal weight/BMI 18.5 - 24.9--> gain 25 - 35 lbs; overweight/BMI 25 - 29.9--> gain 15 - 25 lbs; obese/BMI >30->gain  11 - 20 lbs Problem list reviewed and updated. FIRST/CF  mutation testing/NIPT/QUAD SCREEN/fragile X/Ashkenazi Jewish population testing/Spinal muscular atrophy discussed: declined. Role of ultrasound in pregnancy discussed; fetal survey: declined. Amniocentesis discussed: not indicated. VBAC calculator score: VBAC consent form provided Meds ordered this encounter  Medications  . Prenat-FeCbn-FeAsp-Meth-FA-DHA (PRENATE MINI) 18-0.6-0.4-350 MG CAPS    Sig: Take 1 capsule by mouth daily before breakfast.    Dispense:  90 capsule    Refill:  4   Orders Placed This Encounter  Procedures  . POCT Urinalysis Dipstick    Follow up in 4 weeks.

## 2015-12-17 ENCOUNTER — Other Ambulatory Visit: Payer: Self-pay | Admitting: Obstetrics

## 2015-12-17 ENCOUNTER — Encounter: Payer: Self-pay | Admitting: *Deleted

## 2015-12-17 ENCOUNTER — Ambulatory Visit (INDEPENDENT_AMBULATORY_CARE_PROVIDER_SITE_OTHER): Payer: Medicaid Other | Admitting: Obstetrics

## 2015-12-17 DIAGNOSIS — Z3491 Encounter for supervision of normal pregnancy, unspecified, first trimester: Secondary | ICD-10-CM

## 2015-12-17 DIAGNOSIS — N760 Acute vaginitis: Principal | ICD-10-CM

## 2015-12-17 DIAGNOSIS — B9689 Other specified bacterial agents as the cause of diseases classified elsewhere: Secondary | ICD-10-CM

## 2015-12-17 LAB — PAP IG W/ RFLX HPV ASCU: PAP SMEAR COMMENT: 0

## 2015-12-17 MED ORDER — CLINDAMYCIN HCL 300 MG PO CAPS: 300.0000 mg | ORAL_CAPSULE | Freq: Four times a day (QID) | ORAL | Status: DC

## 2015-12-17 NOTE — Progress Notes (Signed)
Patient not seen by Physician.  Labs Only.

## 2015-12-17 NOTE — Progress Notes (Signed)
Pt not seen by nurse/provider today.   Pt returned to office today for prenatal labwork only.

## 2015-12-18 ENCOUNTER — Other Ambulatory Visit: Payer: Self-pay | Admitting: Obstetrics

## 2015-12-18 LAB — NUSWAB VG+, CANDIDA 6SP
Atopobium vaginae: HIGH Score — AB
BVAB 2: HIGH {score} — AB
CANDIDA ALBICANS, NAA: NEGATIVE
CANDIDA GLABRATA, NAA: NEGATIVE
CANDIDA PARAPSILOSIS, NAA: NEGATIVE
CANDIDA TROPICALIS, NAA: NEGATIVE
Candida krusei, NAA: NEGATIVE
Candida lusitaniae, NAA: NEGATIVE
Chlamydia trachomatis, NAA: NEGATIVE
MEGASPHAERA 1: HIGH {score} — AB
Neisseria gonorrhoeae, NAA: NEGATIVE
Trich vag by NAA: NEGATIVE

## 2015-12-20 ENCOUNTER — Encounter: Payer: Self-pay | Admitting: *Deleted

## 2015-12-20 LAB — HEMOGLOBINOPATHY EVALUATION
HGB A: 97.7 % (ref 94.0–98.0)
HGB C: 0 %
HGB S: 0 %
Hemoglobin A2 Quantitation: 2.3 % (ref 0.7–3.1)
Hemoglobin F Quantitation: 0 % (ref 0.0–2.0)

## 2015-12-20 LAB — VARICELLA ZOSTER ANTIBODY, IGG: Varicella zoster IgG: 721 index (ref >165)

## 2015-12-20 LAB — PRENATAL PROFILE I(LABCORP)
ANTIBODY SCREEN: NEGATIVE
BASOS ABS: 0 10*3/uL (ref 0.0–0.2)
Basos: 0 %
EOS (ABSOLUTE): 0.1 10*3/uL (ref 0.0–0.4)
EOS: 2 %
HEMATOCRIT: 36.1 % (ref 34.0–46.6)
HEP B S AG: NEGATIVE
Hemoglobin: 11.6 g/dL (ref 11.1–15.9)
IMMATURE GRANS (ABS): 0 10*3/uL (ref 0.0–0.1)
IMMATURE GRANULOCYTES: 0 %
Lymphocytes Absolute: 1.9 10*3/uL (ref 0.7–3.1)
Lymphs: 22 %
MCH: 26.4 pg — AB (ref 26.6–33.0)
MCHC: 32.1 g/dL (ref 31.5–35.7)
MCV: 82 fL (ref 79–97)
MONOCYTES: 6 %
Monocytes Absolute: 0.5 10*3/uL (ref 0.1–0.9)
NEUTROS PCT: 70 %
Neutrophils Absolute: 6.2 10*3/uL (ref 1.4–7.0)
PLATELETS: 302 10*3/uL (ref 150–379)
RBC: 4.4 x10E6/uL (ref 3.77–5.28)
RDW: 14.6 % (ref 12.3–15.4)
RH TYPE: POSITIVE
RPR Ser Ql: NONREACTIVE
RUBELLA: 1.44 {index} (ref >0.99)
WBC: 8.8 10*3/uL (ref 3.4–10.8)

## 2015-12-20 LAB — HIV ANTIBODY (ROUTINE TESTING W REFLEX): HIV Screen 4th Generation wRfx: NONREACTIVE

## 2015-12-20 LAB — VITAMIN D 25 HYDROXY (VIT D DEFICIENCY, FRACTURES): Vit D, 25-Hydroxy: 21.4 ng/mL — ABNORMAL LOW (ref 30.0–100.0)

## 2016-01-04 ENCOUNTER — Ambulatory Visit (INDEPENDENT_AMBULATORY_CARE_PROVIDER_SITE_OTHER): Payer: Medicaid Other | Admitting: Certified Nurse Midwife

## 2016-01-04 VITALS — BP 108/72 | HR 91 | Wt 147.0 lb

## 2016-01-04 DIAGNOSIS — Z3482 Encounter for supervision of other normal pregnancy, second trimester: Secondary | ICD-10-CM

## 2016-01-04 NOTE — Progress Notes (Signed)
  Subjective:    Tracy GlennMonica [name] is a 30 y.o. female being seen today for her obstetrical visit. She is at 5658w3d gestation. Patient reports: no complaints.  Reports increased vaginal discharge, denies LOF, cramping or vaginal bleeding.   Problem List Items Addressed This Visit    None    Visit Diagnoses    Supervision of other normal pregnancy, antepartum, second trimester    -  Primary    Relevant Orders    MaterniT21 PLUS Core+SCA    US OB Comp + 14 Wk    US OB Limited      Patient Active Problem List   Diagnosis Date Noted  . Unspecified symptom associated with female genital organs 04/07/2013  . Normal delivery 02/20/2013  . Poor fetal growth, affecting management of mother, antepartum condition or complication 12/05/2012  . Urinary tract infection, site not specified 12/05/2012  . ALLERGIC RHINITIS 05/31/2007  . ABDOMINAL PAIN OTHER SPECIFIED SITE 05/31/2007    Objective:     BP 108/72 mmHg  Pulse 91  Wt 147 lb (66.679 kg)  LMP 10/02/2015 (Exact Date) Uterine Size: Below umbilicus   FHR: 140 by doppler  Assessment:    Pregnancy @ 7158w3d  weeks Doing well   Unsure of LMP Plan:    Problem list reviewed and updated. Labs reviewed.  Follow up in 4 weeks. FIRST/CF mutation testing/NIPT/QUAD SCREEN/fragile X/Ashkenazi Jewish population testing/Spinal muscular atrophy discussed: ordered. Role of ultrasound in pregnancy discussed; fetal survey: ordered. Amniocentesis discussed: not indicated. 50% of 15 minute visit spent on counseling and coordination of care.

## 2016-01-06 ENCOUNTER — Ambulatory Visit (INDEPENDENT_AMBULATORY_CARE_PROVIDER_SITE_OTHER): Payer: Medicaid Other

## 2016-01-06 ENCOUNTER — Other Ambulatory Visit: Payer: Self-pay | Admitting: Certified Nurse Midwife

## 2016-01-06 DIAGNOSIS — Z3482 Encounter for supervision of other normal pregnancy, second trimester: Secondary | ICD-10-CM

## 2016-01-06 DIAGNOSIS — O3680X1 Pregnancy with inconclusive fetal viability, fetus 1: Secondary | ICD-10-CM | POA: Diagnosis not present

## 2016-01-12 ENCOUNTER — Other Ambulatory Visit: Payer: Self-pay | Admitting: Certified Nurse Midwife

## 2016-01-12 LAB — MATERNIT21 PLUS CORE+SCA
CHROMOSOME 13: NEGATIVE
CHROMOSOME 18: NEGATIVE
CHROMOSOME 21: NEGATIVE
PDF: 0
Y CHROMOSOME: NOT DETECTED

## 2016-01-14 ENCOUNTER — Other Ambulatory Visit: Payer: Self-pay | Admitting: Certified Nurse Midwife

## 2016-02-02 ENCOUNTER — Encounter: Payer: Medicaid Other | Admitting: Certified Nurse Midwife

## 2016-02-10 ENCOUNTER — Encounter: Payer: Self-pay | Admitting: Certified Nurse Midwife

## 2016-02-10 ENCOUNTER — Ambulatory Visit (INDEPENDENT_AMBULATORY_CARE_PROVIDER_SITE_OTHER): Payer: Medicaid Other | Admitting: Certified Nurse Midwife

## 2016-02-10 ENCOUNTER — Ambulatory Visit (INDEPENDENT_AMBULATORY_CARE_PROVIDER_SITE_OTHER): Payer: Medicaid Other

## 2016-02-10 VITALS — BP 107/72 | HR 93 | Temp 97.7°F | Wt 148.0 lb

## 2016-02-10 DIAGNOSIS — Z3482 Encounter for supervision of other normal pregnancy, second trimester: Secondary | ICD-10-CM

## 2016-02-10 LAB — POCT URINALYSIS DIPSTICK
BILIRUBIN UA: NEGATIVE
GLUCOSE UA: NEGATIVE
Ketones, UA: NEGATIVE
Leukocytes, UA: NEGATIVE
Nitrite, UA: NEGATIVE
Protein, UA: NEGATIVE
RBC UA: NEGATIVE
Urobilinogen, UA: NEGATIVE
pH, UA: 6

## 2016-02-10 MED ORDER — VITAFOL GUMMIES 3.33-0.333-34.8 MG PO CHEW: 3.0000 | CHEWABLE_TABLET | Freq: Every day | ORAL | Status: DC

## 2016-02-10 NOTE — Progress Notes (Signed)
Subjective:    Tracy [name] is a 30 y.o. female being seen today for her obstetrical visit. She is at 4855w6d gestation. Patient reports: no complaints . Fetal movement: normal.  Problem List Items Addressed This Visit    None    Visit Diagnoses    Encounter for supervision of other normal pregnancy in second trimester    -  Primary    Relevant Medications    Prenatal Vit-Fe Phos-FA-Omega (VITAFOL GUMMIES) 3.33-0.333-34.8 MG CHEW    Other Relevant Orders    POCT urinalysis dipstick (Completed)      Patient Active Problem List   Diagnosis Date Noted  . Unspecified symptom associated with female genital organs 04/07/2013  . Normal delivery 02/20/2013  . Poor fetal growth, affecting management of mother, antepartum condition or complication 12/05/2012  . Urinary tract infection, site not specified 12/05/2012  . ALLERGIC RHINITIS 05/31/2007  . ABDOMINAL PAIN OTHER SPECIFIED SITE 05/31/2007   Objective:    BP 107/72 mmHg  Pulse 93  Temp(Src) 97.7 F (36.5 C)  Wt 148 lb (67.132 kg)  LMP 10/02/2015 (Exact Date) FHT: 150 BPM  Uterine Size: size equals dates     Assessment:    Pregnancy @ 9055w6d    Doing well  Plan:   Dates changed to 06/30/16  OBGCT: discussed. Signs and symptoms of preterm labor: discussed.  Labs, problem list reviewed and updated 2 hr GTT planned Follow up in 4 weeks.

## 2016-03-09 ENCOUNTER — Ambulatory Visit (INDEPENDENT_AMBULATORY_CARE_PROVIDER_SITE_OTHER): Payer: Medicaid Other | Admitting: Certified Nurse Midwife

## 2016-03-09 ENCOUNTER — Encounter: Payer: Self-pay | Admitting: *Deleted

## 2016-03-09 VITALS — BP 104/69 | HR 101 | Temp 99.0°F | Wt 151.2 lb

## 2016-03-09 DIAGNOSIS — Z3492 Encounter for supervision of normal pregnancy, unspecified, second trimester: Secondary | ICD-10-CM

## 2016-03-09 LAB — POCT URINALYSIS DIPSTICK
BILIRUBIN UA: NEGATIVE
GLUCOSE UA: NEGATIVE
Ketones, UA: NEGATIVE
NITRITE UA: NEGATIVE
Protein, UA: NEGATIVE
RBC UA: NEGATIVE
Spec Grav, UA: <=1.005
Urobilinogen, UA: 0.2
pH, UA: 7

## 2016-03-09 NOTE — Progress Notes (Signed)
Pt c/o right flank pain, denies dysuria.

## 2016-03-09 NOTE — Progress Notes (Signed)
Subjective:    Tracy [name] is a 30 y.o. female being seen today for her obstetrical visit. She is at 1228w6d gestation. Patient reports: backache, no bleeding, no contractions, no cramping, no leaking and back ache is around the area of her right shoulder most likely strained muscle: homeopathic remidies discussed and encouraged.  Reports URI for several days, denies any fever, congestion, sore throat . Fetal movement: normal.  Problem List Items Addressed This Visit    None    Visit Diagnoses    Prenatal care, second trimester    -  Primary   Relevant Orders   POCT Urinalysis Dipstick (Completed)     Patient Active Problem List   Diagnosis Date Noted  . Unspecified symptom associated with female genital organs 04/07/2013  . Normal delivery 02/20/2013  . Poor fetal growth, affecting management of mother, antepartum condition or complication 12/05/2012  . Urinary tract infection, site not specified 12/05/2012  . ALLERGIC RHINITIS 05/31/2007  . ABDOMINAL PAIN OTHER SPECIFIED SITE 05/31/2007   Objective:    BP 104/69   Pulse (!) 101   Temp 99 F (37.2 C)   Wt 151 lb 3.2 oz (68.6 kg)   LMP 10/02/2015 (Exact Date)   BMI 26.78 kg/m  FHT: 150 BPM  Uterine Size: 24 cm and size equals dates     Assessment:    Pregnancy @ 3628w6d    Plan:    OBGCT: discussed and ordered for next visit. Signs and symptoms of preterm labor: discussed.  Labs, problem list reviewed and updated 2 hr GTT planned Follow up in 4 weeks with 2 hour OGTT.

## 2016-04-06 ENCOUNTER — Encounter: Payer: Self-pay | Admitting: Obstetrics

## 2016-04-06 ENCOUNTER — Other Ambulatory Visit: Payer: Medicaid Other

## 2016-04-06 ENCOUNTER — Encounter: Payer: Medicaid Other | Admitting: Certified Nurse Midwife

## 2016-04-06 ENCOUNTER — Ambulatory Visit (INDEPENDENT_AMBULATORY_CARE_PROVIDER_SITE_OTHER): Payer: Medicaid Other | Admitting: Obstetrics

## 2016-04-06 VITALS — BP 106/70 | HR 94 | Wt 155.0 lb

## 2016-04-06 DIAGNOSIS — O24419 Gestational diabetes mellitus in pregnancy, unspecified control: Secondary | ICD-10-CM

## 2016-04-06 DIAGNOSIS — Z3482 Encounter for supervision of other normal pregnancy, second trimester: Secondary | ICD-10-CM

## 2016-04-06 DIAGNOSIS — Z3493 Encounter for supervision of normal pregnancy, unspecified, third trimester: Secondary | ICD-10-CM

## 2016-04-06 DIAGNOSIS — O99012 Anemia complicating pregnancy, second trimester: Secondary | ICD-10-CM

## 2016-04-06 DIAGNOSIS — O99013 Anemia complicating pregnancy, third trimester: Secondary | ICD-10-CM

## 2016-04-06 MED ORDER — FERROUS SULFATE 325 (65 FE) MG PO TABS: 325.0000 mg | ORAL_TABLET | Freq: Two times a day (BID) | ORAL | 5 refills | Status: DC

## 2016-04-06 NOTE — Addendum Note (Signed)
Addended by: Coral CeoHARPER, Ginia Rudell A on: 04/06/2016 10:19 AM   Modules accepted: Orders

## 2016-04-06 NOTE — Progress Notes (Signed)
Patient ID: Tracy [name], female   DOB: October 11, 1985, 30 y.o.   MRN: [id number] Subjective:    Tracy CancerMonica [name] is a 30 y.o. female being seen today for her obstetrical visit. She is at 132w6d gestation. Patient reports: no complaints . Fetal movement: normal.  Problem List Items Addressed This Visit    None    Visit Diagnoses    Encounter for supervision of other normal pregnancy in second trimester    -  Primary   Relevant Orders   Glucose Tolerance, 2 Hours w/1 Hour   CBC   HIV antibody   RPR     Patient Active Problem List   Diagnosis Date Noted  . Unspecified symptom associated with female genital organs 04/07/2013  . Normal delivery 02/20/2013  . Poor fetal growth, affecting management of mother, antepartum condition or complication 12/05/2012  . Urinary tract infection, site not specified 12/05/2012  . ALLERGIC RHINITIS 05/31/2007  . ABDOMINAL PAIN OTHER SPECIFIED SITE 05/31/2007   Objective:    BP 106/70   Pulse 94   Wt 155 lb (70.3 kg)   LMP 10/02/2015 (Exact Date)   BMI 27.46 kg/m  FHT: 150 BPM  Uterine Size: size equals dates     Assessment:    Pregnancy @ 692w6d    Plan:    OBGCT: ordered. Signs and symptoms of preterm labor: discussed.  Labs, problem list reviewed and updated 2 hr GTT planned Follow up in 2 weeks.

## 2016-04-07 ENCOUNTER — Other Ambulatory Visit: Payer: Self-pay | Admitting: Obstetrics

## 2016-04-07 DIAGNOSIS — O24410 Gestational diabetes mellitus in pregnancy, diet controlled: Secondary | ICD-10-CM

## 2016-04-07 LAB — CBC
Hematocrit: 30.1 % — ABNORMAL LOW (ref 34.0–46.6)
Hemoglobin: 9.5 g/dL — ABNORMAL LOW (ref 11.1–15.9)
MCH: 25.6 pg — ABNORMAL LOW (ref 26.6–33.0)
MCHC: 31.6 g/dL (ref 31.5–35.7)
MCV: 81 fL (ref 79–97)
PLATELETS: 315 10*3/uL (ref 150–379)
RBC: 3.71 x10E6/uL — ABNORMAL LOW (ref 3.77–5.28)
RDW: 14.3 % (ref 12.3–15.4)
WBC: 8.3 10*3/uL (ref 3.4–10.8)

## 2016-04-07 LAB — HIV ANTIBODY (ROUTINE TESTING W REFLEX): HIV Screen 4th Generation wRfx: NONREACTIVE

## 2016-04-07 LAB — GLUCOSE TOLERANCE, 2 HOURS W/ 1HR
Glucose, 1 hour: 184 mg/dL — ABNORMAL HIGH (ref 65–179)
Glucose, 2 hour: 145 mg/dL (ref 65–152)
Glucose, Fasting: 87 mg/dL (ref 65–91)

## 2016-04-07 LAB — RPR: RPR Ser Ql: NONREACTIVE

## 2016-04-08 LAB — SPECIMEN STATUS REPORT

## 2016-04-08 LAB — HGB A1C W/O EAG: Hgb A1c MFr Bld: 5.4 % (ref 4.8–5.6)

## 2016-04-12 NOTE — Addendum Note (Signed)
Addended by: Coral CeoHARPER, Sidnie Swalley A on: 04/12/2016 04:51 PM   Modules accepted: Orders

## 2016-04-19 ENCOUNTER — Telehealth: Payer: Self-pay | Admitting: *Deleted

## 2016-04-19 ENCOUNTER — Encounter (HOSPITAL_COMMUNITY): Payer: Self-pay | Admitting: Obstetrics

## 2016-04-19 NOTE — Telephone Encounter (Signed)
Patient is aware of lab result of 2 hour GTT.We have scheduled appointment to see the nurse for Diabetic teaching,[facility] [phone].Patient is aware she will have a consult at [facility] to follow her pregnancy.

## 2016-04-21 ENCOUNTER — Ambulatory Visit (INDEPENDENT_AMBULATORY_CARE_PROVIDER_SITE_OTHER): Payer: Medicaid Other | Admitting: Obstetrics

## 2016-04-21 ENCOUNTER — Encounter: Payer: Self-pay | Admitting: Obstetrics

## 2016-04-21 VITALS — BP 107/74 | HR 94

## 2016-04-21 DIAGNOSIS — Z3493 Encounter for supervision of normal pregnancy, unspecified, third trimester: Secondary | ICD-10-CM

## 2016-04-21 DIAGNOSIS — R87619 Unspecified abnormal cytological findings in specimens from cervix uteri: Secondary | ICD-10-CM | POA: Diagnosis not present

## 2016-04-21 DIAGNOSIS — Z3480 Encounter for supervision of other normal pregnancy, unspecified trimester: Secondary | ICD-10-CM

## 2016-04-21 DIAGNOSIS — Z3490 Encounter for supervision of normal pregnancy, unspecified, unspecified trimester: Secondary | ICD-10-CM | POA: Insufficient documentation

## 2016-04-21 NOTE — Progress Notes (Signed)
Subjective:    Tracy [name] is a 30 y.o. female being seen today for her obstetrical visit. She is at 465w0d gestation. Patient reports no complaints. Fetal movement: normal.  Problem List Items Addressed This Visit    Abnormal Pap smear of cervix   Supervision of normal pregnancy, antepartum   Relevant Orders   Flu Vaccine QUAD 36+ mos IM (Fluarix, Quad PF)   POCT urine pregnancy    Other Visit Diagnoses   None.    Patient Active Problem List   Diagnosis Date Noted  . Supervision of normal pregnancy, antepartum 04/21/2016  . Abnormal Pap smear of cervix 04/21/2016  . Unspecified symptom associated with female genital organs 04/07/2013  . Normal delivery 02/20/2013  . Poor fetal growth, affecting management of mother, antepartum condition or complication 12/05/2012  . Urinary tract infection, site not specified 12/05/2012  . ALLERGIC RHINITIS 05/31/2007  . ABDOMINAL PAIN OTHER SPECIFIED SITE 05/31/2007   Objective:    BP 107/74   Pulse 94   LMP 10/02/2015 (Exact Date)  FHT:  150 BPM  Uterine Size: size equals dates  Presentation: unsure     Assessment:    Pregnancy @ 735w0d weeks   Plan:     labs reviewed, problem list updated Consent signed. GBS sent TDAP offered  Rhogam given for RH negative Pediatrician: discussed. Infant feeding: plans to breastfeed. Maternity leave: discussed. Cigarette smoking: smokes ? PPD. Orders Placed This Encounter  Procedures  . Flu Vaccine QUAD 36+ mos IM (Fluarix, Quad PF)  . POCT urine pregnancy   No orders of the defined types were placed in this encounter.  Follow up in 2 Weeks.   Patient ID: Tracy [name], female   DOB: [birth year], 30 y.o.   MRN: [id number]

## 2016-04-27 ENCOUNTER — Encounter: Payer: Medicaid Other | Attending: Obstetrics | Admitting: *Deleted

## 2016-04-27 ENCOUNTER — Ambulatory Visit (HOSPITAL_COMMUNITY)
Admission: RE | Admit: 2016-04-27 | Discharge: 2016-04-27 | Disposition: A | Discharge status: Home / Self Care | Payer: Medicaid Other | Source: Ambulatory Visit | Attending: Obstetrics | Admitting: Obstetrics

## 2016-04-27 DIAGNOSIS — Z3A00 Weeks of gestation of pregnancy not specified: Secondary | ICD-10-CM | POA: Diagnosis not present

## 2016-04-27 DIAGNOSIS — O24419 Gestational diabetes mellitus in pregnancy, unspecified control: Secondary | ICD-10-CM | POA: Insufficient documentation

## 2016-04-27 DIAGNOSIS — O24410 Gestational diabetes mellitus in pregnancy, diet controlled: Secondary | ICD-10-CM

## 2016-04-27 NOTE — Progress Notes (Signed)
  Patient was seen on 04/27/2016 for Gestational Diabetes self-management. The following learning objectives were met by the patient during this course:   States the definition of Gestational Diabetes  States why dietary management is important in controlling blood glucose  Describes the effects each nutrient has on blood glucose levels  Demonstrates ability to create a balanced meal plan  Demonstrates carbohydrate counting   States when to check blood glucose levels  Demonstrates proper blood glucose monitoring techniques  States the effect of stress and exercise on blood glucose levels  States the importance of limiting caffeine and abstaining from alcohol and smoking  Patient states she already has an Accu Chek blood glucose monitor and she is testing before breakfast and 2 hours after each meal. She did not bring her Log Book today but I instructed her to bring it to each MD appointment. She reported that in order to drink any milk, she has to eat cookies such as Oreos. She also reports drinking regular soda and sweet tea daily. She is now aware of the high carbohydrate of those and states she is willing to stop for the duration of her pregnancy.   Patient instructed to monitor glucose levels: FBS: 60 - <90 2 hour: <120  *Patient received handouts:  Nutrition Diabetes and Pregnancy  Carbohydrate Counting List  Patient will be seen for follow-up as needed.

## 2016-04-28 ENCOUNTER — Encounter (HOSPITAL_COMMUNITY): Payer: Self-pay

## 2016-04-28 ENCOUNTER — Ambulatory Visit (HOSPITAL_COMMUNITY): Payer: Medicaid Other

## 2016-05-05 ENCOUNTER — Encounter (HOSPITAL_COMMUNITY): Payer: Self-pay

## 2016-05-05 ENCOUNTER — Ambulatory Visit (HOSPITAL_COMMUNITY)
Admission: RE | Admit: 2016-05-05 | Discharge: 2016-05-05 | Disposition: A | Discharge status: Home / Self Care | Payer: Medicaid Other | Source: Ambulatory Visit | Attending: Obstetrics | Admitting: Obstetrics

## 2016-05-05 ENCOUNTER — Other Ambulatory Visit: Payer: Self-pay | Admitting: Obstetrics

## 2016-05-05 DIAGNOSIS — Z363 Encounter for antenatal screening for malformations: Secondary | ICD-10-CM

## 2016-05-05 DIAGNOSIS — O99333 Smoking (tobacco) complicating pregnancy, third trimester: Secondary | ICD-10-CM | POA: Diagnosis not present

## 2016-05-05 DIAGNOSIS — O24410 Gestational diabetes mellitus in pregnancy, diet controlled: Secondary | ICD-10-CM | POA: Insufficient documentation

## 2016-05-05 DIAGNOSIS — O24419 Gestational diabetes mellitus in pregnancy, unspecified control: Secondary | ICD-10-CM

## 2016-05-05 DIAGNOSIS — Z3A32 32 weeks gestation of pregnancy: Secondary | ICD-10-CM

## 2016-05-05 DIAGNOSIS — F17210 Nicotine dependence, cigarettes, uncomplicated: Secondary | ICD-10-CM | POA: Diagnosis not present

## 2016-05-05 DIAGNOSIS — R87619 Unspecified abnormal cytological findings in specimens from cervix uteri: Secondary | ICD-10-CM

## 2016-05-05 NOTE — Consult Note (Signed)
Maternal Fetal Medicine Consultation  Requesting Provider(s): Coral Ceoharles Harper, MD  Reason for consultation: Gestational diabetes - uncertain control  HPI: Tracy [name] is a 30 yo [id number], EDD 06/30/2016 who is currently at 4182w0d seen for consultation due to gestational diabetes.  The patient underwent a 2-hr OGTT on 9/07 that showed a Fasting value of 87; 1-hr of 184 and 2-hr of 145 mg/dl.  Her HbA1C value was 5.4%.  Ms. Tracy [name] was seen for diabetes education.  She reports that she is following her diabetes diet and checking her fasting and 2-hr post prandial values, but unfortunately did no bring in her fingerstick log or glucometer.  She reports that her fasting values are "sort of under 100" and that her 2-hr values are < 130 mg/dl.  She is without complaints today - no history of prediabetes or preexisting diabetes.  Her past OB history is remarkable for a prior term SVD and 3 previous early SABs.  She is without complaints today.  OB History: OB History    Gravida Para Term Preterm AB Living   5 1 1  0 3 1   SAB TAB Ectopic Multiple Live Births   3 0 0 0 1      PMH:  Past Medical History:  Diagnosis Date  . Depression   . No pertinent past medical history   . Teeth problem    cracked molars, pt unsure if just right side or both sides    PSH:  Past Surgical History:  Procedure Laterality Date  . NO PAST SURGERIES     Meds:  Current Outpatient Prescriptions on File Prior to Encounter  Medication Sig Dispense Refill  . ferrous sulfate 325 (65 FE) MG tablet Take 1 tablet (325 mg total) by mouth 2 (two) times daily with a meal. 100 tablet 5  . Prenatal Vit-Fe Phos-FA-Omega (VITAFOL GUMMIES) 3.33-0.333-34.8 MG CHEW Chew 3 tablets by mouth daily. 90 tablet 12  . Prenat-FeCbn-FeAsp-Meth-FA-DHA (PRENATE MINI) 18-0.6-0.4-350 MG CAPS Take 1 capsule by mouth daily before breakfast. (Patient not taking: Reported on 05/05/2016) 90 capsule 4   No current facility-administered  medications on file prior to encounter.    Allergies: No Known Allergies   FH: reports paternal aunt / uncle with congenital deafness.  No other birth defects or hereditary disorders.  Mother has diabetes.  Soc:  Social History   Social History  . Marital status: Single    Spouse name: N/A  . Number of children: N/A  . Years of education: N/A   Occupational History  . Not on file.   Social History Main Topics  . Smoking status: Current Some Day Smoker    Types: Cigarettes  . Smokeless tobacco: Never Used  . Alcohol use No  . Drug use: No  . Sexual activity: Yes    Birth control/ protection: None   Other Topics Concern  . Not on file   Social History Narrative  . No narrative on file    PE:   Vitals:   05/05/16 1337  BP: 105/67  Pulse: (!) 115    GEN: well-appearing female ABD: gravid, NT  Ultrasound: Single IUP at 32w 0d Gestational diabetes - uncertain control Limited views of the fetal spine obtained The remainder of the fetal anatomy appears normal The estimated fetal weight is at the 73rd %tile Posterior placenta without previa Normal amniotic fluid volume  A/P: 1) Single IUP at 32w 0d  2) Gestational diabetes - diet controlled currently.  Did not bring in fingerstick  logs for review for further determination.  Briefly discussed risk of Gestational diabetes to include fetal macrosomia, birth injury,increased risk of cesarean delivery, metabolic derangements in the fetus.  Stressed the importance of maintaining tight glucose control during pregnancy and the importance of performing fingerstick values.  Recommendations: 1) The patient will call in her fingerstick results to the office once she returns home.  She will also call in her values in 2 weeks. 2) If fasting or postprandial values are elevated, would have a low threshold to start oral medication.  If oral medications are required, will also need to begin antenatal testing. 3) Follow up ultrasound  in 4 weeks for growth; will bring in fingerstick log for review again at that time. 4) If the patient fails diet, would recommend delivery at 39 weeks.  If well controlled on diet only, would recommend delivery ~ 40 weeks.   Thank you for the opportunity to be a part of the care of [name]. Please contact our office if we can be of further assistance.   I spent approximately 30 minutes with this patient with over 50% of time spent in face-to-face counseling.  Alpha Gula, MD Maternal Fetal Medicine

## 2016-05-08 ENCOUNTER — Encounter (HOSPITAL_COMMUNITY): Payer: Self-pay

## 2016-05-08 ENCOUNTER — Other Ambulatory Visit (HOSPITAL_COMMUNITY): Payer: Self-pay | Admitting: *Deleted

## 2016-05-08 DIAGNOSIS — O24419 Gestational diabetes mellitus in pregnancy, unspecified control: Secondary | ICD-10-CM

## 2016-05-09 ENCOUNTER — Encounter: Payer: Self-pay | Admitting: Obstetrics

## 2016-05-22 ENCOUNTER — Inpatient Hospital Stay (HOSPITAL_COMMUNITY)
Admission: AD | Admit: 2016-05-22 | Discharge: 2016-05-22 | Disposition: A | Discharge status: Home / Self Care | Payer: Medicaid Other | Source: Ambulatory Visit | Attending: Family Medicine | Admitting: Family Medicine

## 2016-05-22 ENCOUNTER — Encounter (HOSPITAL_COMMUNITY): Payer: Self-pay | Admitting: *Deleted

## 2016-05-22 DIAGNOSIS — Z3A34 34 weeks gestation of pregnancy: Secondary | ICD-10-CM | POA: Insufficient documentation

## 2016-05-22 DIAGNOSIS — R102 Pelvic and perineal pain: Secondary | ICD-10-CM

## 2016-05-22 DIAGNOSIS — Z87891 Personal history of nicotine dependence: Secondary | ICD-10-CM | POA: Insufficient documentation

## 2016-05-22 DIAGNOSIS — R109 Unspecified abdominal pain: Secondary | ICD-10-CM | POA: Diagnosis present

## 2016-05-22 DIAGNOSIS — O479 False labor, unspecified: Secondary | ICD-10-CM

## 2016-05-22 DIAGNOSIS — O4703 False labor before 37 completed weeks of gestation, third trimester: Secondary | ICD-10-CM | POA: Diagnosis not present

## 2016-05-22 DIAGNOSIS — O26899 Other specified pregnancy related conditions, unspecified trimester: Secondary | ICD-10-CM

## 2016-05-22 HISTORY — DX: Type 2 diabetes mellitus without complications: E11.9

## 2016-05-22 LAB — URINALYSIS, ROUTINE W REFLEX MICROSCOPIC
Bilirubin Urine: NEGATIVE
Glucose, UA: NEGATIVE mg/dL
Hgb urine dipstick: NEGATIVE
Ketones, ur: NEGATIVE mg/dL
LEUKOCYTES UA: NEGATIVE
Nitrite: NEGATIVE
PROTEIN: NEGATIVE mg/dL
SPECIFIC GRAVITY, URINE: 1.02 (ref 1.005–1.030)
pH: 7 (ref 5.0–8.0)

## 2016-05-22 LAB — WET PREP, GENITAL
CLUE CELLS WET PREP: NONE SEEN
SPERM: NONE SEEN
Trich, Wet Prep: NONE SEEN
YEAST WET PREP: NONE SEEN

## 2016-05-22 NOTE — Discharge Instructions (Signed)
Contracciones de Physiological scientist (Braxton Hicks Contractions) Durante el [place], pueden presentarse contracciones uterinas que no siempre indican que est en [place].  QU SON LAS CONTRACCIONES DE BRAXTON HICKS?  Las [employer] se presentan antes del [place] de [place] se conocen como contracciones de [place] o falso trabajo de [place]. Hacia el final del embarazo (32 a 34semanas), estas contracciones pueden aparecen con ms frecuencia y volverse ms intensas. No corresponden al [place] de parto verdadero porque estas contracciones no producen el agrandamiento (la dilatacin) y el afinamiento del cuello del tero. Algunas veces, es difcil distinguirlas del trabajo de parto verdadero porque en algunos casos pueden ser [employer], y las personas tienen diferentes niveles de tolerancia al [employer]. No debe sentirse avergonzada si concurre al hospital con falso trabajo de [place]. En ocasiones, la nica forma de saber si el trabajo de parto es verdadero es que el mdico determine si hay cambios en el cuello del tero. Si no hay problemas prenatales u otras complicaciones de salud asociadas con el embarazo, no habr inconvenientes si la envan a su casa con falso trabajo de parto y espera que comience el verdadero. [place] DEL VERDADERO Falso trabajo de parto  Las contracciones del falso trabajo de parto duran menos y no son tan intensas como las verdaderas.  Generalmente son irregulares.  A menudo, se sienten en la parte delantera de la parte baja del abdomen y en la ingle,  y pueden desaparecer cuando camina o cambia de posicin mientras est acostada.  Las contracciones se vuelven ms dbiles y su duracin es Garment/textile technologist a medida que el tiempo transcurre.  Por lo general, no se hacen progresivamente ms intensas, regulares y Magazine features editor entre s como en el caso del [place] de parto verdadero. Antionette Fairy de parto  Las contracciones del verdadero  trabajo de parto duran de 62 a 70segundos, son muy regulares y suelen volverse ms intensas, y [place] su frecuencia.  No desaparecen cuando camina.  La molestia generalmente se siente en la parte superior del tero y se extiende hacia la zona inferior del abdomen y [employer] cintura.  El mdico podr examinarla para determinar si el trabajo de parto es verdadero. El examen mostrar si el cuello del tero se est dilatando y [place]. LO QUE DEBE RECORDAR  [place] haciendo los ejercicios habituales y siga otras indicaciones que el mdico le d.  Tome todos los medicamentos como le indic el mdico.  Consulting civil engineer a las visitas prenatales regulares.  Coma y beba con moderacin si cree que est en trabajo de parto.  Si las contracciones de [employer] provocan incomodidad:  [place] de posicin: si est acostada o descansando, camine; si est caminando, descanse.  Sintese y descanse en una baera con agua tibia.  Beba 2 o 3vasos de [place]. La deshidratacin puede provocar contracciones.  Respire lenta y profundamente varias veces por hora. [place]? Solicite atencin mdica de inmediato si:  Las contracciones se intensifican, se hacen ms regulares y Industrial/product designer s.  Tiene una prdida de lquido por la vagina.  Tiene fiebre.  Elimina mucosidad manchada con [place].  Tiene una hemorragia vaginal abundante.  Tiene dolor abdominal permanente.  Tiene un dolor en la zona lumbar que nunca tuvo antes.  Siente que la cabeza del beb empuja hacia abajo y ejerce presin en la zona plvica.  El beb no se mueve Administrator, Civil Service.   Esta informacin no tiene como fin  reemplazar el consejo del mdico. Asegrese de hacerle al mdico cualquier pregunta que tenga.   Document Released: 04/26/2005 Document Revised: 07/22/2013 Elsevier Interactive Patient Education [employer]2016 Elsevier Inc. Round Ligament Pain The round ligament is a cord of muscle and  tissue that helps to support the uterus. It can become a source of pain during pregnancy if it becomes stretched or twisted as the baby grows. The pain usually begins in the second trimester of pregnancy, and it can come and go until the baby is delivered. It is not a serious problem, and it does not cause harm to the baby. Round ligament pain is usually a short, sharp, and pinching pain, but it can also be a dull, lingering, and aching pain. The pain is felt in the lower side of the abdomen or in the groin. It usually starts deep in the groin and moves up to the outside of the hip area. Pain can occur with:  A sudden change in position.  Rolling over in bed.  Coughing or sneezing.  Physical activity. HOME CARE INSTRUCTIONS Watch your condition for any changes. Take these steps to help with your pain:  When the pain starts, relax. Then try:  Sitting down.  Flexing your knees up to your abdomen.  Lying on your side with one pillow under your abdomen and another pillow between your legs.  Sitting in a warm bath for 15-20 minutes or until the pain goes away.  Take over-the-counter and prescription medicines only as told by your health care provider.  Move slowly when you sit and stand.  Avoid long walks if they cause pain.  Stop or lessen your physical activities if they cause pain. SEEK MEDICAL CARE IF:  Your pain does not go away with treatment.  You feel pain in your back that you did not have before.  Your medicine is not helping. SEEK IMMEDIATE MEDICAL CARE IF:  You develop a fever or chills.  You develop uterine contractions.  You develop vaginal bleeding.  You develop nausea or vomiting.  You develop diarrhea.  You have pain when you urinate.   This information is not intended to replace advice given to you by your health care provider. Make sure you discuss any questions you have with your health care provider.   Document Released: 04/25/2008 Document  Revised: 10/09/2011 Document Reviewed: 09/23/2014 Elsevier Interactive Patient Education [employer]2016 Elsevier Inc.

## 2016-05-22 NOTE — MAU Note (Signed)
Patient presents to MAU with contractions every five minutes that started last night.  Pain seems worse when walking.  No vaginal bleeding or LOF, but patient states having white discharge.  Reports +fetal movement.  Previous vaginal delivery.

## 2016-05-22 NOTE — MAU Provider Note (Signed)
History     CSN: [id number]  Arrival date and time: 05/22/16 1453   First Provider Initiated Contact with Patient 05/22/16 1539      Chief Complaint  Patient presents with  . Labor Eval   HPI Tracy [name] is a 30 y.o. [phone] at 6043w3d who presents with abdominal pain & vaginal discharge. Reports contractions every 5 minutes since last night. Also describes sharp lower abdominal pain that occurs with movement and position changes. Rates pain 7/10. Has not treated.  Vaginal discharge this week -- white & thick; no vaginal irritation. Endorses some dysuria & increased urinary frequency. Denies vaginal bleeding or LOF. Positive fetal movement.   OB History    Gravida Para Term Preterm AB Living   5 1 1  0 3 1   SAB TAB Ectopic Multiple Live Births   3 0 0 0 1      Past Medical History:  Diagnosis Date  . Depression   . Diabetes mellitus without complication (HCC)    diet controlled  . Teeth problem    cracked molars, pt unsure if just right side or both sides    Past Surgical History:  Procedure Laterality Date  . NO PAST SURGERIES      History reviewed. No pertinent family history.  Social History  Substance Use Topics  . Smoking status: Former Smoker    Types: Cigarettes  . Smokeless tobacco: Never Used  . Alcohol use No    Allergies: No Known Allergies  Prescriptions Prior to Admission  Medication Sig Dispense Refill Last Dose  . ferrous sulfate 325 (65 FE) MG tablet Take 1 tablet (325 mg total) by mouth 2 (two) times daily with a meal. 100 tablet 5 Taking  . Prenat-FeCbn-FeAsp-Meth-FA-DHA (PRENATE MINI) 18-0.6-0.4-350 MG CAPS Take 1 capsule by mouth daily before breakfast. (Patient not taking: Reported on 05/05/2016) 90 capsule 4 Not Taking  . Prenatal Vit-Fe Phos-FA-Omega (VITAFOL GUMMIES) 3.33-0.333-34.8 MG CHEW Chew 3 tablets by mouth daily. 90 tablet 12 Taking    Review of Systems  Constitutional: Negative.   Gastrointestinal: Positive for  abdominal pain. Negative for constipation, diarrhea, nausea and vomiting.  Genitourinary: Positive for dysuria and frequency. Negative for flank pain and hematuria.       + vaginal discharge No vaginal bleeding or LOF   Physical Exam   Blood pressure 105/65, pulse 102, temperature 98.2 F (36.8 C), temperature source Oral, resp. rate 16, height 5' 1.22" (1.555 m), weight 158 lb 9.6 oz (71.9 kg), last menstrual period 10/02/2015, SpO2 97 %.  Physical Exam  Nursing note and vitals reviewed. Constitutional: She is oriented to person, place, and time. She appears well-developed and well-nourished. No distress.  HENT:  Head: Normocephalic and atraumatic.  Eyes: Conjunctivae are normal. Right eye exhibits no discharge. Left eye exhibits no discharge. No scleral icterus.  Neck: Normal range of motion.  Respiratory: Effort normal. No respiratory distress.  Genitourinary: No erythema or bleeding in the vagina. Vaginal discharge (small amount of thick white discharge) found.  Neurological: She is alert and oriented to person, place, and time.  Skin: Skin is warm and dry. She is not diaphoretic.  Psychiatric: She has a normal mood and affect. Her behavior is normal. Judgment and thought content normal.   Dilation: Closed Exam by:: Judeth HornErin Lucero Auzenne, NP  Fetal Tracing:  Baseline: 150 Variability: moderate Accelerations: 15x15 Decelerations: none  Toco: x 4   MAU Course  Procedures Results for orders placed or performed during the hospital encounter of  05/22/16 (from the past 24 hour(s))  Urinalysis, Routine w reflex microscopic (not at [facility])     Status: Abnormal   Collection Time: 05/22/16  3:15 PM  Result Value Ref Range   Color, Urine YELLOW YELLOW   APPearance HAZY (A) CLEAR   Specific Gravity, Urine 1.020 1.005 - 1.030   pH 7.0 5.0 - 8.0   Glucose, UA NEGATIVE NEGATIVE mg/dL   Hgb urine dipstick NEGATIVE NEGATIVE   Bilirubin Urine NEGATIVE NEGATIVE   Ketones, ur NEGATIVE NEGATIVE  mg/dL   Protein, ur NEGATIVE NEGATIVE mg/dL   Nitrite NEGATIVE NEGATIVE   Leukocytes, UA NEGATIVE NEGATIVE    MDM Category 1 tracing Cervix closed U/a negative -- will send for culture d/t pt's symptoms GC/CT & wet prep Assessment and Plan  A: 1. Braxton Hick's contraction   2. Pain of round ligament during pregnancy    P; Discharge home Urine culture pending Discussed reasons to return to MAU Keep f/u with OB  Judeth Horn 05/22/2016, 3:38 PM

## 2016-05-23 ENCOUNTER — Encounter: Payer: Self-pay | Admitting: Obstetrics

## 2016-05-23 LAB — GC/CHLAMYDIA PROBE AMP (~~LOC~~) NOT AT ARMC
Chlamydia: NEGATIVE
Neisseria Gonorrhea: NEGATIVE

## 2016-05-29 ENCOUNTER — Encounter: Payer: Self-pay | Admitting: Obstetrics

## 2016-05-29 ENCOUNTER — Ambulatory Visit (INDEPENDENT_AMBULATORY_CARE_PROVIDER_SITE_OTHER): Payer: Medicaid Other | Admitting: Obstetrics

## 2016-05-29 VITALS — BP 113/69 | HR 104 | Temp 97.2°F | Wt 161.0 lb

## 2016-05-29 DIAGNOSIS — Z23 Encounter for immunization: Secondary | ICD-10-CM

## 2016-05-29 DIAGNOSIS — Z3483 Encounter for supervision of other normal pregnancy, third trimester: Secondary | ICD-10-CM | POA: Diagnosis not present

## 2016-05-29 DIAGNOSIS — Z3A35 35 weeks gestation of pregnancy: Secondary | ICD-10-CM

## 2016-05-29 DIAGNOSIS — Z3480 Encounter for supervision of other normal pregnancy, unspecified trimester: Secondary | ICD-10-CM

## 2016-05-29 NOTE — Progress Notes (Signed)
Patient was seen at MAU for contraction- she is better now

## 2016-05-29 NOTE — Progress Notes (Signed)
Subjective:    Tracy [name] is a 30 y.o. female being seen today for her obstetrical visit. She is at 6371w3d gestation. Patient reports no complaints. Fetal movement: normal.  Problem List Items Addressed This Visit    Supervision of normal pregnancy, antepartum    Other Visit Diagnoses    [redacted] weeks gestation of pregnancy    -  Primary   Relevant Orders   Strep Gp B NAA   Tdap vaccine greater than or equal to 30yo IM (Completed)     Patient Active Problem List   Diagnosis Date Noted  . Supervision of normal pregnancy, antepartum 04/21/2016  . Abnormal Pap smear of cervix 04/21/2016  . Unspecified symptom associated with female genital organs 04/07/2013  . Normal delivery 02/20/2013  . Poor fetal growth, affecting management of mother, antepartum condition or complication 12/05/2012  . Urinary tract infection, site not specified 12/05/2012  . ALLERGIC RHINITIS 05/31/2007  . ABDOMINAL PAIN OTHER SPECIFIED SITE 05/31/2007   Objective:    BP 113/69   Pulse (!) 104   Temp 97.2 F (36.2 C)   Wt 161 lb (73 kg)   LMP 10/02/2015 (Exact Date)   BMI 30.20 kg/m  FHT:  150 BPM  Uterine Size: size equals dates  Presentation: unsure     Assessment:    Pregnancy @ 2471w3d weeks   Plan:     labs reviewed, problem list updated Consent signed. GBS sent TDAP offered  Rhogam given for RH negative Pediatrician: discussed. Infant feeding: plans to breastfeed. Maternity leave: discussed. Cigarette smoking: ker. Orders Placed This Encounter  Procedures  . Strep Gp B NAA  . Tdap vaccine greater than or equal to 30yo IM   No orders of the defined types were placed in this encounter.  Follow up in 1 Week.   Patient ID: Tracy [name], female   DOB: [birth year], 30 y.o.   MRN: [id number]

## 2016-05-31 LAB — STREP GP B NAA: Strep Gp B NAA: NEGATIVE

## 2016-06-02 ENCOUNTER — Ambulatory Visit (HOSPITAL_COMMUNITY)
Admission: RE | Admit: 2016-06-02 | Discharge: 2016-06-02 | Disposition: A | Discharge status: Home / Self Care | Payer: Medicaid Other | Source: Ambulatory Visit | Attending: Obstetrics | Admitting: Obstetrics

## 2016-06-02 ENCOUNTER — Other Ambulatory Visit (HOSPITAL_COMMUNITY): Payer: Self-pay | Admitting: *Deleted

## 2016-06-02 ENCOUNTER — Other Ambulatory Visit (HOSPITAL_COMMUNITY): Payer: Self-pay | Admitting: Maternal and Fetal Medicine

## 2016-06-02 ENCOUNTER — Encounter (HOSPITAL_COMMUNITY): Payer: Self-pay

## 2016-06-02 DIAGNOSIS — Z3688 Encounter for antenatal screening for fetal macrosomia: Secondary | ICD-10-CM | POA: Insufficient documentation

## 2016-06-02 DIAGNOSIS — O24419 Gestational diabetes mellitus in pregnancy, unspecified control: Secondary | ICD-10-CM

## 2016-06-02 DIAGNOSIS — Z3A36 36 weeks gestation of pregnancy: Secondary | ICD-10-CM | POA: Insufficient documentation

## 2016-06-02 DIAGNOSIS — Z3480 Encounter for supervision of other normal pregnancy, unspecified trimester: Secondary | ICD-10-CM

## 2016-06-07 ENCOUNTER — Encounter: Payer: Medicaid Other | Admitting: Obstetrics & Gynecology

## 2016-06-09 ENCOUNTER — Other Ambulatory Visit (HOSPITAL_COMMUNITY): Payer: Self-pay | Admitting: Maternal and Fetal Medicine

## 2016-06-09 ENCOUNTER — Encounter (HOSPITAL_COMMUNITY): Payer: Self-pay

## 2016-06-09 ENCOUNTER — Ambulatory Visit (HOSPITAL_COMMUNITY)
Admission: RE | Admit: 2016-06-09 | Discharge: 2016-06-09 | Disposition: A | Discharge status: Home / Self Care | Payer: Medicaid Other | Source: Ambulatory Visit | Attending: Obstetrics | Admitting: Obstetrics

## 2016-06-09 DIAGNOSIS — O24419 Gestational diabetes mellitus in pregnancy, unspecified control: Secondary | ICD-10-CM | POA: Insufficient documentation

## 2016-06-09 DIAGNOSIS — Z3688 Encounter for antenatal screening for fetal macrosomia: Secondary | ICD-10-CM

## 2016-06-09 DIAGNOSIS — Z3A37 37 weeks gestation of pregnancy: Secondary | ICD-10-CM

## 2016-06-13 ENCOUNTER — Encounter: Payer: Self-pay | Admitting: Obstetrics & Gynecology

## 2016-06-16 ENCOUNTER — Encounter (HOSPITAL_COMMUNITY): Payer: Self-pay

## 2016-06-16 ENCOUNTER — Other Ambulatory Visit (HOSPITAL_COMMUNITY): Payer: Self-pay | Admitting: Maternal and Fetal Medicine

## 2016-06-16 ENCOUNTER — Ambulatory Visit (HOSPITAL_COMMUNITY)
Admission: RE | Admit: 2016-06-16 | Discharge: 2016-06-16 | Disposition: A | Discharge status: Home / Self Care | Payer: Medicaid Other | Source: Ambulatory Visit | Attending: Obstetrics | Admitting: Obstetrics

## 2016-06-16 DIAGNOSIS — Z3A38 38 weeks gestation of pregnancy: Secondary | ICD-10-CM

## 2016-06-16 DIAGNOSIS — O24419 Gestational diabetes mellitus in pregnancy, unspecified control: Secondary | ICD-10-CM | POA: Diagnosis present

## 2016-06-16 HISTORY — DX: Gestational diabetes mellitus in pregnancy, unspecified control: O24.419

## 2016-06-16 NOTE — ED Notes (Signed)
Pt did not bring blood glucose log to appointment.  States she has been checking her blood sugar twice a day.  Instructed pt to check before breakfast and 2 hours after each meal and to bring her log to each appointment.  Pt voices understanding.  Pt reports leaking a very small amount of clear water x 3 days.

## 2016-06-20 ENCOUNTER — Encounter: Payer: Self-pay | Admitting: Obstetrics & Gynecology

## 2016-06-20 ENCOUNTER — Telehealth (HOSPITAL_COMMUNITY): Payer: Self-pay | Admitting: *Deleted

## 2016-06-20 ENCOUNTER — Ambulatory Visit (INDEPENDENT_AMBULATORY_CARE_PROVIDER_SITE_OTHER): Payer: Medicaid Other | Admitting: Obstetrics & Gynecology

## 2016-06-20 DIAGNOSIS — Z3480 Encounter for supervision of other normal pregnancy, unspecified trimester: Secondary | ICD-10-CM

## 2016-06-20 DIAGNOSIS — O24419 Gestational diabetes mellitus in pregnancy, unspecified control: Secondary | ICD-10-CM

## 2016-06-20 NOTE — Progress Notes (Signed)
Patient states that she sometimes has contractions at night, reports good fetal movement.

## 2016-06-20 NOTE — Telephone Encounter (Signed)
Preadmission screen Interpreter number [phone]

## 2016-06-20 NOTE — Progress Notes (Signed)
   PRENATAL VISIT NOTE  Subjective:  Tracy [name] is a 30 y.o. [phone] at 4876w4d being seen today for ongoing prenatal care.  She is currently monitored for the following issues for this high-risk pregnancy and has ALLERGIC RHINITIS; ABDOMINAL PAIN OTHER SPECIFIED SITE; Urinary tract infection, site not specified; Unspecified symptom associated with female genital organs; Supervision of normal pregnancy, antepartum; Abnormal Pap smear of cervix; and Gestational diabetes mellitus (GDM) affecting pregnancy, antepartum on her problem list.  Patient reports no complaints.  Contractions: Irregular. Vag. Bleeding: None.  Movement: Present. Denies leaking of fluid.   The following portions of the patient's history were reviewed and updated as appropriate: allergies, current medications, past family history, past medical history, past social history, past surgical history and problem list. Problem list updated.  Objective:   Vitals:   06/20/16 0929  BP: 123/85  Pulse: 98  Temp: 97.3 F (36.3 C)  Weight: 165 lb 3.2 oz (74.9 kg)    Fetal Status: Fetal Heart Rate (bpm): NST   Movement: Present     General:  Alert, oriented and cooperative. Patient is in no acute distress.  Skin: Skin is warm and dry. No rash noted.   Cardiovascular: Normal heart rate noted  Respiratory: Normal respiratory effort, no problems with respiration noted  Abdomen: Soft, gravid, appropriate for gestational age. Pain/Pressure: Present     Pelvic:  Cervical exam performed        Extremities: Normal range of motion.  Edema: None  Mental Status: Normal mood and affect. Normal behavior. Normal judgment and thought content.   Assessment and Plan:  Pregnancy: G5P1031 at 5776w4d  1. Supervision of other normal pregnancy, antepartum NST reactive today, [place] results reviewed  2. Gestational diabetes mellitus (GDM) affecting pregnancy, antepartum State FBS upper 90's and pp 120-130. Due to questionable control will  IOL at 39 weeks  Term labor symptoms and general obstetric precautions including but not limited to vaginal bleeding, contractions, leaking of fluid and fetal movement were reviewed in detail with the patient. Please refer to After Visit Summary for other counseling recommendations.  IOL 11/24, f/u 6 week PP  Adam PhenixJames G Emoni Yang, MD

## 2016-06-23 ENCOUNTER — Ambulatory Visit (HOSPITAL_COMMUNITY)
Admission: RE | Admit: 2016-06-23 | Discharge: 2016-06-23 | Disposition: A | Discharge status: Home / Self Care | Payer: Medicaid Other | Source: Ambulatory Visit | Attending: Obstetrics | Admitting: Obstetrics

## 2016-06-23 ENCOUNTER — Encounter (HOSPITAL_COMMUNITY): Payer: Self-pay

## 2016-06-23 ENCOUNTER — Inpatient Hospital Stay (HOSPITAL_COMMUNITY)
Admission: RE | Admit: 2016-06-23 | Discharge: 2016-06-25 | DRG: 775 | Disposition: A | Discharge status: Home / Self Care | Payer: Medicaid Other | Source: Ambulatory Visit | Attending: Obstetrics and Gynecology | Admitting: Obstetrics and Gynecology

## 2016-06-23 DIAGNOSIS — Z87891 Personal history of nicotine dependence: Secondary | ICD-10-CM

## 2016-06-23 DIAGNOSIS — Z3A39 39 weeks gestation of pregnancy: Secondary | ICD-10-CM

## 2016-06-23 DIAGNOSIS — Z8632 Personal history of gestational diabetes: Secondary | ICD-10-CM | POA: Diagnosis present

## 2016-06-23 DIAGNOSIS — O24419 Gestational diabetes mellitus in pregnancy, unspecified control: Secondary | ICD-10-CM | POA: Diagnosis present

## 2016-06-23 DIAGNOSIS — O24420 Gestational diabetes mellitus in childbirth, diet controlled: Secondary | ICD-10-CM | POA: Diagnosis present

## 2016-06-23 DIAGNOSIS — Z3480 Encounter for supervision of other normal pregnancy, unspecified trimester: Secondary | ICD-10-CM

## 2016-06-23 LAB — GLUCOSE, CAPILLARY
GLUCOSE-CAPILLARY: 114 mg/dL — AB (ref 65–99)
Glucose-Capillary: 82 mg/dL (ref 65–99)
Glucose-Capillary: 88 mg/dL (ref 65–99)

## 2016-06-23 LAB — CBC
HCT: 30.6 % — ABNORMAL LOW (ref 36.0–46.0)
HEMOGLOBIN: 9.7 g/dL — AB (ref 12.0–15.0)
MCH: 24.6 pg — ABNORMAL LOW (ref 26.0–34.0)
MCHC: 31.7 g/dL (ref 30.0–36.0)
MCV: 77.7 fL — ABNORMAL LOW (ref 78.0–100.0)
Platelets: 357 10*3/uL (ref 150–400)
RBC: 3.94 MIL/uL (ref 3.87–5.11)
RDW: 15.2 % (ref 11.5–15.5)
WBC: 8.4 10*3/uL (ref 4.0–10.5)

## 2016-06-23 LAB — TYPE AND SCREEN
ABO/RH(D): O POS
ANTIBODY SCREEN: NEGATIVE

## 2016-06-23 MED ORDER — LACTATED RINGERS IV SOLN: 500.0000 mL | INTRAVENOUS | Status: DC | PRN

## 2016-06-23 MED ORDER — SOD CITRATE-CITRIC ACID 500-334 MG/5ML PO SOLN: 30.0000 mL | ORAL | Status: DC | PRN

## 2016-06-23 MED ORDER — OXYTOCIN BOLUS FROM INFUSION
500.0000 mL | Freq: Once | INTRAVENOUS | Status: AC
  Administered 2016-06-24: 500 mL via INTRAVENOUS

## 2016-06-23 MED ORDER — OXYTOCIN 40 UNITS IN LACTATED RINGERS INFUSION - SIMPLE MED: 2.5000 [IU]/h | INTRAVENOUS | Status: DC

## 2016-06-23 MED ORDER — ONDANSETRON HCL 4 MG/2ML IJ SOLN: 4.0000 mg | Freq: Four times a day (QID) | INTRAMUSCULAR | Status: DC | PRN

## 2016-06-23 MED ORDER — MISOPROSTOL 25 MCG QUARTER TABLET
25.0000 ug | ORAL_TABLET | ORAL | Status: DC | PRN
  Administered 2016-06-23 (×2): 25 ug via VAGINAL
  Filled 2016-06-23: qty 0.25
  Filled 2016-06-23: qty 1
  Filled 2016-06-23: qty 0.25

## 2016-06-23 MED ORDER — LACTATED RINGERS IV SOLN
INTRAVENOUS | Status: DC
  Administered 2016-06-23 – 2016-06-24 (×4): via INTRAVENOUS

## 2016-06-23 MED ORDER — LIDOCAINE HCL (PF) 1 % IJ SOLN
30.0000 mL | INTRAMUSCULAR | Status: DC | PRN
  Administered 2016-06-24: 30 mL via SUBCUTANEOUS
  Filled 2016-06-23: qty 30

## 2016-06-23 MED ORDER — OXYTOCIN 40 UNITS IN LACTATED RINGERS INFUSION - SIMPLE MED
1.0000 m[IU]/min | INTRAVENOUS | Status: DC
  Administered 2016-06-24: 2 m[IU]/min via INTRAVENOUS
  Filled 2016-06-23: qty 1000

## 2016-06-23 MED ORDER — ACETAMINOPHEN 325 MG PO TABS: 650.0000 mg | ORAL_TABLET | ORAL | Status: DC | PRN

## 2016-06-23 MED ORDER — TERBUTALINE SULFATE 1 MG/ML IJ SOLN
0.2500 mg | Freq: Once | INTRAMUSCULAR | Status: DC | PRN
Start: 2016-06-23 — End: 2016-06-24
  Filled 2016-06-23: qty 1

## 2016-06-23 MED ORDER — OXYCODONE-ACETAMINOPHEN 5-325 MG PO TABS: 2.0000 | ORAL_TABLET | ORAL | Status: DC | PRN

## 2016-06-23 MED ORDER — OXYCODONE-ACETAMINOPHEN 5-325 MG PO TABS: 1.0000 | ORAL_TABLET | ORAL | Status: DC | PRN

## 2016-06-23 MED ORDER — FLEET ENEMA 7-19 GM/118ML RE ENEM: 1.0000 | ENEMA | Freq: Every day | RECTAL | Status: DC | PRN

## 2016-06-23 NOTE — H&P (Signed)
LABOR AND DELIVERY ADMISSION HISTORY AND PHYSICAL NOTE  Tracy [name] is a 30 y.o. female [phone] with IUP at 10493w0d by 14wk US presenting for IOL for GDM A. Sugars have been controlled at home.    She reports positive fetal movement. She denies leakage of fluid or vaginal bleeding.  Prenatal History/Complications:  Past Medical History: Past Medical History:  Diagnosis Date  . Depression   . Diabetes mellitus without complication (HCC)    diet controlled  . Gestational diabetes   . Teeth problem    cracked molars, pt unsure if just right side or both sides    Past Surgical History: Past Surgical History:  Procedure Laterality Date  . NO PAST SURGERIES      Obstetrical History: OB History    Gravida Para Term Preterm AB Living   5 1 1  0 3 1   SAB TAB Ectopic Multiple Live Births   3 0 0 0 1      Social History: Social History   Social History  . Marital status: Single    Spouse name: N/A  . Number of children: N/A  . Years of education: N/A   Social History Main Topics  . Smoking status: Former Smoker    Types: Cigarettes  . Smokeless tobacco: Never Used  . Alcohol use No  . Drug use: No  . Sexual activity: Yes    Birth control/ protection: None   Other Topics Concern  . None   Social History Narrative  . None    Family History: History reviewed. No pertinent family history.  Allergies: No Known Allergies  Prescriptions Prior to Admission  Medication Sig Dispense Refill Last Dose  . Acetaminophen (TYLENOL PO) Take by mouth.   Not Taking  . ferrous sulfate 325 (65 FE) MG tablet Take 1 tablet (325 mg total) by mouth 2 (two) times daily with a meal. 100 tablet 5 Taking  . Prenat-FeCbn-FeAsp-Meth-FA-DHA (PRENATE MINI) 18-0.6-0.4-350 MG CAPS Take 1 capsule by mouth daily before breakfast. (Patient not taking: Reported on 06/20/2016) 90 capsule 4 Not Taking  . Prenatal Vit-Fe Phos-FA-Omega (VITAFOL GUMMIES) 3.33-0.333-34.8 MG CHEW Chew 3  tablets by mouth daily. 90 tablet 12 Taking     Review of Systems   All systems reviewed and negative except as stated in HPI  Blood pressure 99/63, pulse 85, temperature 98.2 F (36.8 C), temperature source Oral, resp. rate 18, height 5\' 1"  (1.549 m), weight 165 lb (74.8 kg), last menstrual period 10/02/2015. General appearance: alert, cooperative and appears stated age Lungs: clear to auscultation bilaterally Heart: regular rate and rhythm Abdomen: soft, non-tender; bowel sounds normal Extremities: No calf swelling or tenderness Presentation: cephalic Fetal monitoring: 145/mod/(+)accels no decels Uterine activity: uterine CTXs irregular Dilation: Fingertip Effacement (%): Thick Station: -3 Exam by:: Dr. Earlene PlaterWallace   Prenatal labs: ABO, Rh: O/Positive/-- (05/19 1358) Antibody: Negative (05/19 1358) Rubella: !Error! RPR: Non Reactive (09/07 1050)  HBsAg: Negative (05/19 1358)  HIV: Non Reactive (09/07 1050)  GBS: Negative (10/30 1419)  2 hr Glucola: 87/184/145 Genetic screening:   Anatomy US: wnl  Prenatal Transfer Tool  Maternal Diabetes: Yes:  Diabetes Type:  Diet controlled Genetic Screening: Normal Maternal Ultrasounds/Referrals: Normal Fetal Ultrasounds or other Referrals:  None Maternal Substance Abuse:  No Significant Maternal Medications:  None Significant Maternal Lab Results: None  Results for orders placed or performed during the hospital encounter of 06/23/16 (from the past 24 hour(s))  CBC   Collection Time: 06/23/16  8:15 AM  Result  Value Ref Range   WBC 8.4 4.0 - 10.5 K/uL   RBC 3.94 3.87 - 5.11 MIL/uL   Hemoglobin 9.7 (L) 12.0 - 15.0 g/dL   HCT 16.130.6 (L) 09.636.0 - 04.546.0 %   MCV 77.7 (L) 78.0 - 100.0 fL   MCH 24.6 (L) 26.0 - 34.0 pg   MCHC 31.7 30.0 - 36.0 g/dL   RDW 40.915.2 81.111.5 - 91.415.5 %   Platelets 357 150 - 400 K/uL  Glucose, capillary   Collection Time: 06/23/16  9:02 AM  Result Value Ref Range   Glucose-Capillary 114 (H) 65 - 99 mg/dL   Comment 1  Notify RN    Comment 2 Document in Chart     Patient Active Problem List   Diagnosis Date Noted  . Gestational diabetes 06/23/2016  . Gestational diabetes mellitus (GDM) affecting pregnancy, antepartum 06/20/2016  . Supervision of normal pregnancy, antepartum 04/21/2016  . Abnormal Pap smear of cervix 04/21/2016  . Unspecified symptom associated with female genital organs 04/07/2013  . Urinary tract infection, site not specified 12/05/2012  . ALLERGIC RHINITIS 05/31/2007  . ABDOMINAL PAIN OTHER SPECIFIED SITE 05/31/2007    Assessment: Tracy [name] is a 30 y.o. [phone] at 2064w0d here for IOL due to GDM  #Labor:Anticipate SVD/ IOL with vaginal cytotec #Pain: IV pain meds prn/epidural on request #FWB: Cat I tracing  #ID:  GBS negative #MOF: Both #MOC:Oral Contraceptive Pills  #Circ:  N/A #GDM: Serial CBG q4hr. Last 114.diet controlled  WALLACE, NOAH I, DO PGY-3 06/23/2016, 10:14 AM  OB FELLOW HISTORY AND PHYSICAL ATTESTATION  I have seen and examined this patient; I agree with above documentation in the resident's note.    Tracy [name] 06/23/2016, 10:49 AM

## 2016-06-23 NOTE — Progress Notes (Addendum)
Assumed care of pt after receiving report from previous shift nurse. Pt resting comfortably with visitor at bs.   2015: cytotec placed and SVE: 1.5.   2250: pt states feel some ctx and pain tolerable. toco adjusted.  2311: up to bathroom 2317: tranducer adjusted  0115:  pitocin started per MD orders  0150: relinquished care over to charge nurse  [phone]: Assumed care of pt. Pt desires epidural. Orders placed in Cc.   0437: anesthesia at bs discussing risk and benefits of epidural. 0442: T/O for epidural done with everyone in agreement. Pt positioned sitting up.   0448: test dose. Pt denies S/E of test dose. Instructed not to get oob due to epidural placement. Pt verbalizes understanding.   0507: Foley placed. Noted clear yellow urine in tubing.

## 2016-06-23 NOTE — Progress Notes (Signed)
Patient doing well and comfortable.CTX q101min. Held next cytotec dose. Cervix 1/40/-3. Foley Bulb placed. Cat I tracing. Pain meds IV prn/Epidural on request. Pt would like to wait at this time.  Sheria LangNoah Wallace,DO PGY-3

## 2016-06-23 NOTE — Anesthesia Pain Management Evaluation Note (Signed)
  CRNA Pain Management Visit Note  Patient: Tracy RidingMonica [name], 30 y.o., female  "Hello I am a [facility] the [facility]. We have an anesthesia team available at all times to provide care throughout the hospital, including epidural management and anesthesia for C-section. I don't know your plan for the delivery whether it a natural birth, water birth, IV sedation, nitrous supplementation, doula or epidural, but we want to meet your pain goals."   1.Was your pain managed to your expectations on prior hospitalizations?   No   2.What is your expectation for pain management during this hospitalization?     Epidural  3.How can we help you reach that goal?   Record the patient's initial score and the patient's pain goal.   Pain: 4  Pain Goal: 9 [facility] wants you to be able to say your pain was always managed very well.  Laban EmperorMalinova,Bonna Steury Hristova 06/23/2016

## 2016-06-24 ENCOUNTER — Inpatient Hospital Stay (HOSPITAL_COMMUNITY): Payer: Medicaid Other | Admitting: Anesthesiology

## 2016-06-24 ENCOUNTER — Encounter (HOSPITAL_COMMUNITY): Payer: Self-pay

## 2016-06-24 DIAGNOSIS — O24420 Gestational diabetes mellitus in childbirth, diet controlled: Secondary | ICD-10-CM

## 2016-06-24 DIAGNOSIS — Z3A39 39 weeks gestation of pregnancy: Secondary | ICD-10-CM

## 2016-06-24 LAB — GLUCOSE, CAPILLARY: GLUCOSE-CAPILLARY: 91 mg/dL (ref 65–99)

## 2016-06-24 LAB — RPR: RPR: NONREACTIVE

## 2016-06-24 MED ORDER — LACTATED RINGERS IV SOLN
500.0000 mL | Freq: Once | INTRAVENOUS | Status: AC
  Administered 2016-06-24: 500 mL via INTRAVENOUS

## 2016-06-24 MED ORDER — ZOLPIDEM TARTRATE 5 MG PO TABS: 5.0000 mg | ORAL_TABLET | Freq: Every evening | ORAL | Status: DC | PRN

## 2016-06-24 MED ORDER — COCONUT OIL OIL: 1.0000 "application " | TOPICAL_OIL | Status: DC | PRN

## 2016-06-24 MED ORDER — PHENYLEPHRINE 40 MCG/ML (10ML) SYRINGE FOR IV PUSH (FOR BLOOD PRESSURE SUPPORT)
80.0000 ug | PREFILLED_SYRINGE | INTRAVENOUS | Status: DC | PRN
  Filled 2016-06-24: qty 5

## 2016-06-24 MED ORDER — ACETAMINOPHEN 325 MG PO TABS: 650.0000 mg | ORAL_TABLET | ORAL | Status: DC | PRN

## 2016-06-24 MED ORDER — LIDOCAINE HCL (PF) 1 % IJ SOLN
INTRAMUSCULAR | Status: DC | PRN
  Administered 2016-06-24: 6 mL via EPIDURAL
  Administered 2016-06-24: 4 mL via EPIDURAL

## 2016-06-24 MED ORDER — BENZOCAINE-MENTHOL 20-0.5 % EX AERO
1.0000 "application " | INHALATION_SPRAY | CUTANEOUS | Status: DC | PRN
  Administered 2016-06-25: 1 via TOPICAL
  Filled 2016-06-24: qty 56

## 2016-06-24 MED ORDER — PRENATAL MULTIVITAMIN CH
1.0000 | ORAL_TABLET | Freq: Every day | ORAL | Status: DC
  Administered 2016-06-25: 1 via ORAL
  Filled 2016-06-24: qty 1

## 2016-06-24 MED ORDER — WITCH HAZEL-GLYCERIN EX PADS: 1.0000 "application " | MEDICATED_PAD | CUTANEOUS | Status: DC | PRN

## 2016-06-24 MED ORDER — DIPHENHYDRAMINE HCL 25 MG PO CAPS: 25.0000 mg | ORAL_CAPSULE | Freq: Four times a day (QID) | ORAL | Status: DC | PRN

## 2016-06-24 MED ORDER — BUTALBITAL-APAP-CAFFEINE 50-325-40 MG PO TABS
1.0000 | ORAL_TABLET | ORAL | Status: DC | PRN
  Administered 2016-06-24: 1 via ORAL
  Filled 2016-06-24: qty 1

## 2016-06-24 MED ORDER — FENTANYL 2.5 MCG/ML BUPIVACAINE 1/10 % EPIDURAL INFUSION (WH - ANES)
INTRAMUSCULAR | Status: AC
  Filled 2016-06-24: qty 100

## 2016-06-24 MED ORDER — DIPHENHYDRAMINE HCL 50 MG/ML IJ SOLN: 12.5000 mg | INTRAMUSCULAR | Status: DC | PRN

## 2016-06-24 MED ORDER — EPHEDRINE 5 MG/ML INJ
10.0000 mg | INTRAVENOUS | Status: DC | PRN
  Filled 2016-06-24: qty 4

## 2016-06-24 MED ORDER — EPHEDRINE 5 MG/ML INJ: 10.0000 mg | INTRAVENOUS | Status: DC | PRN

## 2016-06-24 MED ORDER — PHENYLEPHRINE 40 MCG/ML (10ML) SYRINGE FOR IV PUSH (FOR BLOOD PRESSURE SUPPORT)
PREFILLED_SYRINGE | INTRAVENOUS | Status: AC
  Filled 2016-06-24: qty 20

## 2016-06-24 MED ORDER — SENNOSIDES-DOCUSATE SODIUM 8.6-50 MG PO TABS
2.0000 | ORAL_TABLET | ORAL | Status: DC
  Administered 2016-06-25: 2 via ORAL
  Filled 2016-06-24: qty 2

## 2016-06-24 MED ORDER — ONDANSETRON HCL 4 MG/2ML IJ SOLN: 4.0000 mg | INTRAMUSCULAR | Status: DC | PRN

## 2016-06-24 MED ORDER — ONDANSETRON HCL 4 MG PO TABS: 4.0000 mg | ORAL_TABLET | ORAL | Status: DC | PRN

## 2016-06-24 MED ORDER — FENTANYL CITRATE (PF) 100 MCG/2ML IJ SOLN
INTRAMUSCULAR | Status: AC
  Filled 2016-06-24: qty 2

## 2016-06-24 MED ORDER — TETANUS-DIPHTH-ACELL PERTUSSIS 5-2.5-18.5 LF-MCG/0.5 IM SUSP
0.5000 mL | Freq: Once | INTRAMUSCULAR | Status: DC
Start: 2016-06-25 — End: 2016-06-25

## 2016-06-24 MED ORDER — DIBUCAINE 1 % RE OINT: 1.0000 "application " | TOPICAL_OINTMENT | RECTAL | Status: DC | PRN

## 2016-06-24 MED ORDER — LACTATED RINGERS IV SOLN: 500.0000 mL | Freq: Once | INTRAVENOUS | Status: DC

## 2016-06-24 MED ORDER — FENTANYL 2.5 MCG/ML BUPIVACAINE 1/10 % EPIDURAL INFUSION (WH - ANES)
14.0000 mL/h | INTRAMUSCULAR | Status: DC | PRN
  Administered 2016-06-24 (×2): 14 mL/h via EPIDURAL
  Filled 2016-06-24: qty 100

## 2016-06-24 MED ORDER — FENTANYL CITRATE (PF) 100 MCG/2ML IJ SOLN
100.0000 ug | INTRAMUSCULAR | Status: DC | PRN
  Administered 2016-06-24: 100 ug via INTRAVENOUS

## 2016-06-24 MED ORDER — IBUPROFEN 600 MG PO TABS
600.0000 mg | ORAL_TABLET | Freq: Four times a day (QID) | ORAL | Status: DC
  Administered 2016-06-24 – 2016-06-25 (×4): 600 mg via ORAL
  Filled 2016-06-24 (×4): qty 1

## 2016-06-24 MED ORDER — PHENYLEPHRINE 40 MCG/ML (10ML) SYRINGE FOR IV PUSH (FOR BLOOD PRESSURE SUPPORT): 80.0000 ug | PREFILLED_SYRINGE | INTRAVENOUS | Status: DC | PRN

## 2016-06-24 MED ORDER — SIMETHICONE 80 MG PO CHEW: 80.0000 mg | CHEWABLE_TABLET | ORAL | Status: DC | PRN

## 2016-06-24 NOTE — Progress Notes (Signed)
Patient ID: Tracy [name], female   DOB: 1986-07-06, 30 y.o.   MRN: [id number] Feeling urge to push  Vitals:   06/24/16 1146 06/24/16 1200 06/24/16 1215 06/24/16 1230  BP: 112/74 130/82 119/76 (!) 147/129  Pulse: 90 80 86 (!) 141  Resp: 18 18 18 18   Temp:  97.7 F (36.5 C)    TempSrc:  Axillary    SpO2:      Weight:      Height:       FHR reactive UCsevery 2 min  Dilation: 10 Dilation Complete Date: 06/24/16 Dilation Complete Time: 1221 Effacement (%): 100 Cervical Position: Middle Station: 0 Presentation: Vertex Exam by:: Shelia MediaWilliams, Desirey Keahey CNM  Pushed a few times and baby is still high Suspect OP position Will have her push a while longer

## 2016-06-24 NOTE — Lactation Note (Signed)
This note was copied from a baby's chart. Lactation Consultation Note  Patient Name: Tracy [name] QMVHQ'IToday's Date: 06/24/2016 Reason for consult: Initial assessment;Other (Comment) (mom GDM, Blood Sugar drawn . MBURN fed .5 EBM . mom is eating lunch ) Baby is 3 hours old and MBU RN from tx nursery assisted to spoon feed .5 ml of EBM , Serum blood sugar 62 at 2 hours.  After mom finishes lunch LC will re- visit.    Maternal Data Does the patient have breastfeeding experience prior to this delivery?: Yes  Feeding Feeding Type: Breast Fed Length of feed: 1 min  LATCH Score/Interventions Latch: Repeated attempts needed to sustain latch, nipple held in mouth throughout feeding, stimulation needed to elicit sucking reflex. Intervention(s): Assist with latch  Audible Swallowing: None Intervention(s): Skin to skin;Hand expression  Type of Nipple: Everted at rest and after stimulation  Comfort (Breast/Nipple): Soft / non-tender     Hold (Positioning): Full assist, staff holds infant at breast Intervention(s): Breastfeeding basics reviewed  LATCH Score: 5  Lactation Tools Discussed/Used     Consult Status Consult Status: Follow-up Date: 06/24/16 Follow-up type: In-patient    Matilde SprangMargaret Ann Everton [name] 06/24/2016, 4:38 PM

## 2016-06-24 NOTE — Anesthesia Procedure Notes (Signed)
Epidural Patient location during procedure: OB Start time: 06/24/2016 4:40 AM End time: 06/24/2016 4:48 AM  Staffing Anesthesiologist: Linton RumpALLAN, Keilen Kahl DICKERSON Performed: anesthesiologist   Preanesthetic Checklist Completed: patient identified, surgical consent, pre-op evaluation, timeout performed, IV checked, risks and benefits discussed and monitors and equipment checked  Epidural Patient position: sitting Prep: site prepped and draped and DuraPrep Patient monitoring: continuous pulse ox and blood pressure Approach: midline Location: L2-L3 Injection technique: LOR air  Needle:  Needle type: Tuohy  Needle gauge: 17 G Needle length: 9 cm and 9 Needle insertion depth: 5.5 cm Catheter type: closed end flexible Catheter size: 19 Gauge Catheter at skin depth: 10.5 cm Test dose: negative  Assessment Events: blood not aspirated, injection not painful, no injection resistance, negative IV test and no paresthesia  Additional Notes Reason for block:procedure for pain

## 2016-06-24 NOTE — Progress Notes (Signed)
Patient ID: Tracy [name], female   DOB: [birth year], 30 y.o.   MRN: [id number] Doing well except has a headache  Vitals:   06/24/16 0700 06/24/16 0740 06/24/16 0831 06/24/16 0902  BP: 122/78  120/78 125/78  Pulse: 83  90 84  Resp:   16   Temp:  98.2 F (36.8 C) 98 F (36.7 C)   TempSrc:  Oral    SpO2: 96%     Weight:      Height:        FHR reactive UCs every 2 min  Dilation: 3 Effacement (%): 70 Cervical Position: Middle Station: -2 Presentation: Vertex Exam by:: Cleone SlimH. Mitchell RNC   Will give Fioricet for headache

## 2016-06-24 NOTE — Anesthesia Preprocedure Evaluation (Signed)
Anesthesia Evaluation  Patient identified by MRN, date of birth, ID band Patient awake    Reviewed: Allergy & Precautions, NPO status , Patient's Chart, lab work & pertinent test results  History of Anesthesia Complications Negative for: history of anesthetic complications  Airway Mallampati: III  TM Distance: >3 FB Neck ROM: Full    Dental   Cracked molars:   Pulmonary former smoker,    Pulmonary exam normal breath sounds clear to auscultation       Cardiovascular negative cardio ROS   Rhythm:Regular Rate:Normal     Neuro/Psych PSYCHIATRIC DISORDERS Depression negative neurological ROS     GI/Hepatic negative GI ROS, Neg liver ROS,   Endo/Other  diabetes, Gestational  Renal/GU negative Renal ROS     Musculoskeletal   Abdominal   Peds  Hematology negative hematology ROS (+)   Anesthesia Other Findings   Reproductive/Obstetrics (+) Pregnancy                             Anesthesia Physical Anesthesia Plan  ASA: II  Anesthesia Plan: Epidural   Post-op Pain Management:    Induction:   Airway Management Planned: Natural Airway  Additional Equipment:   Intra-op Plan:   Post-operative Plan:   Informed Consent: I have reviewed the patients History and Physical, chart, labs and discussed the procedure including the risks, benefits and alternatives for the proposed anesthesia with the patient or authorized representative who has indicated his/her understanding and acceptance.     Plan Discussed with: Anesthesiologist  Anesthesia Plan Comments: (I have discussed risks of neuraxial anesthesia including but not limited to infection, bleeding, nerve injury, back pain, headache, seizures, and failure of block. Patient denies bleeding disorders and is not currently anticoagulated. Labs have been reviewed. Risks and benefits discussed. All patient's questions answered.   Platelets 357)         Anesthesia Quick Evaluation

## 2016-06-24 NOTE — Lactation Note (Signed)
This note was copied from a baby's chart. Lactation Consultation Note  Patient Name: Tracy [name]: 06/24/2016 Reason for consult: Follow-up assessment Baby is 4 hour old, 2nd visit this afternoon.  Baby sleeping and family holding baby.  [facility] encountered  With the baby , ending with spoon feeding. LC assessed breast tissue and noted  The nipples to be erect, semi compressible with edema on both sides of the nipple. Hand expressed off 3 ml , alternating both breast. Mom returned demo with instruction.  Areola became more compressible. LC spoon fed baby 3 ml and baby did well . After spoon Feeding attempted latch , noted the baby to have a high palate, on and off latch , no depth and  Baby didn't stay latched. LC placed baby on moms chest. Baby sleepy.  LC encouraged mom to call with [facility] cues [facility] or [facility] to assess latch.  The use of the NS may be needed due to the high palate.    Maternal Data Has patient been taught Hand Expression?: Yes Does the patient have breastfeeding experience prior to this delivery?: Yes  Feeding Feeding Type: Breast Fed Length of feed: 1 min  LATCH Score/Interventions Latch: Repeated attempts needed to sustain latch, nipple held in mouth throughout feeding, stimulation needed to elicit sucking reflex. Intervention(s): Adjust position;Assist with latch;Breast massage;Breast compression  Audible Swallowing: None  Type of Nipple: Everted at rest and after stimulation (semi -compressible areolas )  Comfort (Breast/Nipple): Soft / non-tender     Hold (Positioning): Assistance needed to correctly position infant at breast and maintain latch. Intervention(s): Breastfeeding basics reviewed;Support Pillows;Position options;Skin to skin  LATCH Score: 6  Lactation Tools Discussed/Used Tools: Shells;Pump;Flanges (LC instructed mom on the use of the Shells and hand  pump ) Flange Size: 27 (#24 tight at bass , permom uncomfortable ) Shell Type: Inverted Breast pump type: Manual WIC Program: Yes (per mom [place] ) Pump Review: Setup, frequency, and cleaning Initiated by:: MAI  [name] initiated:: 06/24/16   Consult Status Consult Status: Follow-up [name]: 06/25/16 Follow-up type: In-patient    Tracy [name] 06/24/2016, 6:11 PM

## 2016-06-25 MED ORDER — IBUPROFEN 600 MG PO TABS: 600.0000 mg | ORAL_TABLET | Freq: Four times a day (QID) | ORAL | 0 refills | Status: DC

## 2016-06-25 NOTE — Anesthesia Postprocedure Evaluation (Signed)
Anesthesia Post Note  Patient: Tracy [name]  Procedure(s) Performed: * No procedures listed *  Patient location during evaluation: Mother Baby Anesthesia Type: Epidural Level of consciousness: awake and alert Pain management: pain level controlled Vital Signs Assessment: post-procedure vital signs reviewed and stable Respiratory status: spontaneous breathing, nonlabored ventilation and respiratory function stable Cardiovascular status: stable Postop Assessment: no headache, no backache, epidural receding and patient able to bend at knees Anesthetic complications: no     Last Vitals:  Vitals:   06/24/16 2130 06/25/16 0640  BP: 126/75 109/72  Pulse: (!) 107 92  Resp: 20 18  Temp: 36.7 C 36.8 C    Last Pain:  Vitals:   06/25/16 0640  TempSrc: Oral  PainSc:    Pain Goal: Patients Stated Pain Goal: 0 (06/24/16 [id number])               Rica RecordsICKELTON,Melis Trochez

## 2016-06-25 NOTE — Lactation Note (Signed)
This note was copied from a baby's chart. Lactation Consultation Note  Patient Name: Tracy [name] MWUXL'KToday's Date: 06/25/2016 Reason for consult: Follow-up assessment Baby at 23 hr of life and dyad set for D/C today. Mom has flat/very short nipples. She has been offering breast and formula bottles. She had to use a NS with her older child and requested to try one. Applied #24 and baby was able to easily latch. Mom is worried that she does not have enough milk because she "only made 1-2 ml with my son in the first couple of days". Discussed baby behavior, feeding frequency, pumping, baby belly size, voids, wt loss, breast changes, and nipple care. Demonstrated manual expression, colostrum noted bilaterally. She has a Harmony in the room with previously pumped milk in it. She stated the milk is old and needs to be thrown out. Reviewed milk handling and storage. Encouraged her to offer her expressed milk then formula. Mom is aware of lactation services and support group. She plans to F/U with [facility]IC on 06/16/16. She will offer the breast on demand 8+/24hr, post pump, offer her milk and/or formula per volume guidelines. She will call for help as needed.      Maternal Data    Feeding Feeding Type: Breast Fed Nipple Type: Slow - flow  LATCH Score/Interventions Latch: Grasps breast easily, tongue down, lips flanged, rhythmical sucking. Intervention(s): Adjust position;Breast compression  Audible Swallowing: A few with stimulation Intervention(s): Hand expression;Skin to skin Intervention(s): Alternate breast massage  Type of Nipple: Flat Intervention(s): Shells;Hand pump  Comfort (Breast/Nipple): Soft / non-tender     Hold (Positioning): Assistance needed to correctly position infant at breast and maintain latch. Intervention(s): Position options  LATCH Score: 7  Lactation Tools Discussed/Used Tools: Nipple Shields Nipple shield size: 24   Consult Status Consult Status:  Complete Date: 06/25/16 Follow-up type: Call as needed    Tracy [name] 06/25/2016, 12:56 PM

## 2016-06-25 NOTE — Progress Notes (Signed)
Post Partum Day 1 Subjective: no complaints, up ad lib, voiding, tolerating PO and + flatus. Pt requesting discharge today  Objective: Blood pressure 109/72, pulse 92, temperature 98.3 F (36.8 C), temperature source Oral, resp. rate 18, height 5\' 1"  (1.549 m), weight 74.8 kg (165 lb), last menstrual period 10/02/2015, SpO2 96 %, unknown if currently breastfeeding.  Physical Exam:  General: alert, cooperative, appears stated age and no distress Lochia: appropriate Uterine Fundus: firm Incision: N/A DVT Evaluation: No evidence of DVT seen on physical exam.   Recent Labs  06/23/16 0815  HGB 9.7*  HCT 30.6*    Assessment/Plan: Discharge home, Lactation consult and Contraception PoP   LOS: 2 days   Clayton BiblesSamantha Weinhold 06/25/2016, 6:55 AM   I have seen and examined this patient and I agree with the above. See my Discharge Summary. Cam HaiSHAW, Matteus Mcnelly 9:32 AM 06/25/2016

## 2016-06-25 NOTE — Progress Notes (Signed)
[name] was referred for history of depression/anxiety.  Referral is screened out by Clinical Social Worker because none of the following criteria appear to apply and there are no reports impacting the pregnancy or her transition to the postpartum period.  CSW does not deem it clinically necessary to further investigate at this time.   -History of anxiety/depression during this pregnancy, or of post-partum depression.  - Diagnosis of anxiety and/or depression within last 3 years.-  - History of depression due to pregnancy loss/loss of child or -[name]'s symptoms are currently being treated with medication and/or therapy.  Please contact the Clinical Social Worker if needs arise or upon [name] request.    Jasmane Brockway, MSW, LCSW-A Clinical Social Worker  [facility]  Office: [phone]   

## 2016-06-25 NOTE — Discharge Summary (Signed)
OB Discharge Summary     Patient Name: Tracy [name] DOB: [birth year] MRN: [id number]  Date of admission: 06/23/2016 Delivering MD: Aviva SignsWILLIAMS, MARIE L   Date of discharge: 06/25/2016  Admitting diagnosis: INDUCTION Intrauterine pregnancy: 4975w1d     Secondary diagnosis:  Active Problems:   Gestational diabetes  Additional problems: none     Discharge diagnosis: Term Pregnancy Delivered, GDM A1 and shoulder dystocia                                                                                                Post partum procedures:none  Augmentation: Pitocin, Cytotec and Foley Balloon  Complications: shoulder dystocia x 2 mins- see Delivery Summary  Hospital course:  Induction of Labor With Vaginal Delivery   30 y.o. yo [id number] at 7575w1d was admitted to the hospital 06/23/2016 for induction of labor.  Indication for induction: A1 DM.  Patient had an  labor course as follows, but then with a shoulder dystocia at delivery: Membrane Rupture Time/Date: 8:34 AM ,06/24/2016   Intrapartum Procedures: Episiotomy: None [1]                                         Lacerations:  2nd degree [3];Perineal [11]  Patient had delivery of a Viable infant.  Information for the patient's newborn:  Tracy [name], Girl [name] [[id number]][[id number]]  Delivery Method: Vaginal, Spontaneous Delivery (Filed from Delivery Summary)   06/24/2016  Details of delivery can be found in separate delivery note.  Patient had a routine postpartum course. Patient is discharged home 06/25/16.   Physical exam Vitals:   06/24/16 1555 06/24/16 1734 06/24/16 2130 06/25/16 0640  BP: 122/74 124/73 126/75 109/72  Pulse: (!) 111 (!) 107 (!) 107 92  Resp: 20 20 20 18   Temp: 98.8 F (37.1 C) 98.6 F (37 C) 98.1 F (36.7 C) 98.3 F (36.8 C)  TempSrc: Oral Oral Oral Oral  SpO2:      Weight:      Height:       General: alert and cooperative Lochia: appropriate Uterine Fundus: firm Incision: N/A DVT  Evaluation: No evidence of DVT seen on physical exam. Labs: Lab Results  Component Value Date   WBC 8.4 06/23/2016   HGB 9.7 (L) 06/23/2016   HCT 30.6 (L) 06/23/2016   MCV 77.7 (L) 06/23/2016   PLT 357 06/23/2016   CMP Latest Ref Rng & Units 06/22/2015  Glucose 65 - 99 mg/dL 82  BUN 6 - 20 mg/dL 14  Creatinine 1.300.44 - 8.651.00 mg/dL 7.840.71  Sodium 696135 - 295145 mmol/L 138  Potassium 3.5 - 5.1 mmol/L 3.9  Chloride 101 - 111 mmol/L 107  CO2 22 - 32 mmol/L 25  Calcium 8.9 - 10.3 mg/dL 9.2  Total Protein 6.5 - 8.1 g/dL -  Total Bilirubin 0.3 - 1.2 mg/dL -  Alkaline Phos 38 - 284126 U/L -  AST 15 - 41 U/L -  ALT 14 - 54 U/L -    Discharge instruction: per After  Visit Summary and "Baby and Me Booklet".  After visit meds:    Medication List    STOP taking these medications   acetaminophen 325 MG tablet Commonly known as:  TYLENOL     TAKE these medications   ibuprofen 600 MG tablet Commonly known as:  ADVIL,MOTRIN Take 1 tablet (600 mg total) by mouth every 6 (six) hours.   VITAFOL GUMMIES 3.33-0.333-34.8 MG Chew Chew 1 each by mouth 3 (three) times daily.       Diet: routine diet  Activity: Advance as tolerated. Pelvic rest for 6 weeks.   Outpatient follow up:4-6 weeks Follow up Appt:No future appointments. Follow up Visit:No Follow-up on file.  Postpartum contraception: Progesterone only pills  Newborn Data: Live born female  Birth Weight: 7 lb 14.8 oz (3595 g) APGAR: 4, 9  Baby Feeding: Bottle and Breast Disposition:home with mother   06/25/2016 Cam HaiSHAW, KIMBERLY, CNM  9:39 AM

## 2016-06-25 NOTE — Discharge Instructions (Signed)
Parto vaginal, cuidados de puerperio  (Postpartum Care After Vaginal Delivery)  El período de tiempo que sigue inmediatamente al parto se conoce como puerperio.  ¿QUÉ TIPO DE ATENCIÓN MÉDICA RECIBIRÉ?  · Podría continuar recibiendo medicamentos y líquidos través de una vía intravenosa (IV) que se colocará en una de sus venas.  · Si se le realizó una incisión cerca de la vagina (episiotomía) o si ha tenido algún desgarro durante el parto, podrían indicarle que se coloque compresas frías sobre la episiotomía o el desgarro. Esto ayuda a aliviar el dolor y la hinchazón.  · Es posible que le den una botella rociadora para que use cuando vaya al baño. Puede utilizarla hasta que se sienta cómoda limpiándose de la manera habitual. Siga los pasos a continuación para usar la botella rociadora:  ? Antes de orinar, llene la botella rociadora con agua tibia. No use agua caliente.  ? Después de orinar, mientras aún está sentada en el inodoro, use la botella rociadora para enjuagar el área alrededor de la uretra y la abertura vaginal. Con esto podrá limpiar cualquier rastro de orina y sangre.  ? Puede hacer esto en lugar de secarse. Cuando comience a sanar, podrá usar la botella rociadora antes de secarse. Asegúrese de secarse suavemente.  ? Llene la botella rociadora con agua limpia cada vez que vaya al baño.  · Deberá usar apósitos sanitarios.    ¿CÓMO PUEDO SENTIRME?  · Quizás no tenga necesidad de orinar durante varias horas después del parto.  · Sentirá algo de dolor y molestias en el abdomen y la vagina.  · Si está amamantando, podría tener contracciones uterinas cada vez que lo haga. Estas podrían prolongarse hasta varias semanas durante el puerperio. Las contracciones uterinas ayudan al útero a regresar a su tamaño habitual.  · Es normal tener un poco de hemorragia vaginal (loquios) después del parto. La cantidad y apariencia de los loquios a menudo es similar a las del período menstrual la primera semana después del  parto. Disminuirá gradualmente las siguientes semanas hasta convertirse en una descarga seca amarronada o amarillenta. En la mayoría de las mujeres, los loquios se detienen completamente entre 6 a 8 semanas después del parto. Los sangrados vaginales pueden variar de mujer a mujer.  · Los primeros días después del parto, podría padecer congestión mamaria. Los pechos se sentirán pesados, llenos y molestos. Las mamas también podrían latir y ponerse duras, muy tirantes, calientes y sensibles al tacto. Cuando esto ocurra, podría notar leche que se escapa de los senos. El médico puede recomendarle algunos métodos para aliviar este malestar causado por la congestión mamaria. La congestión mamaria debería desaparecer al cabo de unos días.  · Podría sentirse más deprimida o preocupada que lo habitual debido a los cambios hormonales luego del parto. Estos sentimientos no deben durar más de unos pocos días. Si no desaparecen al cabo de algunos días, hable con su médico.    ¿QUÉ CUIDADOS DEBO TENER?  · Infórmele a su médico si siente dolor o malestar.  · Beba suficiente agua para mantener la orina clara o de color amarillo pálido.  · Lávese bien las manos con agua y jabón durante al menos 20 segundos después de cambiar el apósito sanitario, usar el baño o antes de sostener o alimentar al bebé.  · Si no está amamantando, evite tocarse mucho los senos. Al hacerlo, podrían producir más leche.  · Si se siente débil o mareada, o si siente que está a punto de desmayarse, pida ayuda antes de realizar lo   siguiente:  ? Levantarse de la cama.  ? Ducharse.  · Cambie los apósitos sanitarios con frecuencia. Observe si hay cambios en el flujo, como un aumento repentino en el volumen, cambios en el color o coágulos sanguíneos de gran tamaño. Si expulsa un coágulo sanguíneo por la vagina, guárdelo para mostrárselo a su médico. No tire la cadena sin que el médico examine el coágulo antes.  · Asegúrese de tener todas las vacunas al día. Esto la  ayudará a estar protegida y a proteger al bebé de determinadas enfermedades. Podría necesitar vacunas antes de dejar el hospital.  · Si lo desea, hable con el médico acerca de los métodos de planificación familiar o control de la natalidad (métodos anticonceptivos).    ¿CÓMO PUEDO ESTABLECER LAZOS CON MI BEBÉ?  Pasar tanto tiempo como le sea posible con el bebé es sumamente importante. Durante ese tiempo, usted y su bebé pueden conocerse y desarrollar lazos. Tener al bebé con usted en la habitación le dará tiempo de conocerlo. Esto también puede hacerla sentir más cómoda para atender al bebé. Amamantar también puede ayudarla a crear lazos con el bebé.  ¿CÓMO PUEDO PLANIFICAR MI REGRESO A CASA CON EL BEBÉ?  · Asegúrese de tener instalada una butaca en el automóvil.  ? La butaca debe contar con la certificación del fabricante para asegurarse de que esté instalada en forma segura.  ? Asegúrese de que el bebé quede bien asegurado en la butaca.  · Pregúntele al médico todo lo que necesite saber sobre los cuidados de su bebé. Asegúrese de poder comunicarse con el médico en caso de que tenga preguntas luego de dejar el hospital.    Esta información no tiene como fin reemplazar el consejo del médico. Asegúrese de hacerle al médico cualquier pregunta que tenga.  Document Released: 05/14/2007 Document Revised: 11/08/2015 Document Reviewed: 06/21/2015  Elsevier Interactive Patient Education © 2017 [employer]

## 2016-06-26 ENCOUNTER — Ambulatory Visit: Payer: Self-pay

## 2016-06-26 NOTE — Lactation Note (Signed)
This note was copied from a baby's chart. Lactation Consultation Note  Patient Name: Tracy [name] RUEAV'WToday's Date: 06/26/2016 Reason for consult: Follow-up assessment   Follow up with mom of 45 hour old infant. Infant with 7 formula feeds of 15-30 cc, Breast x 2 for 5 minutes using # 24 NS, 7 voids and 4 stools in last 24 hours. LATCH score 7 by bedside RN. Infant weight 7 lb 7.8 oz with weight loss of 6% since birth. Maternal history of GDM. She reports she did not make a milk supply for her older child.   Mom was giving infant a bottle of formula when I went into the room. Mom reports she does not have any milk. Discussed supply and demand and milk coming to volume. Enc mom to offer breast prior to bottle feeding to stimulate milk production. Discussed with mom that if infant not feeding at breast and if infant using NS, it is recommended that she pump to stimulate milk supply. Mom reports she will use her manual pump and that when she did pump she is only getting 1-2 cc. Discussed with mom changing infant formula to Similac advanced care since not BF at this time. Tracy RongKris, RN also discussed with mom prior to d/c.   Discussed with mom that an OP appt is recommended with use of NS, mom declined OP appt. She has LC phone # and is aware she can call and schedule an OP appt after d/c. Mom is active with WIC and plans to call and make an appt after d/c.   Reviewed all BF information in Taking Care of Baby and Me Booklet. Reviewed I/O, BF Basics, Positioning, pillow support, engorgement treatment/prevention, and breast milk handling and storage. Mom without questions/concerns at this time. Reviewed OP appts, LC phone # and BF Support Groups with mom. Infant with f/u Ped appt tomorrow.    Maternal Data Formula Feeding for Exclusion: Yes Reason for exclusion: Mother's choice to formula and breast feed on admission  Feeding Feeding Type: Formula Nipple Type: Slow - flow  LATCH  Score/Interventions                      Lactation Tools Discussed/Used WIC Program: Yes   Consult Status Consult Status: Complete Follow-up type: Call as needed    Tracy BlalockSharon S Merlene [name] 06/26/2016, 10:43 AM

## 2016-06-28 ENCOUNTER — Telehealth (HOSPITAL_COMMUNITY): Payer: Self-pay

## 2016-06-28 NOTE — Telephone Encounter (Signed)
Mom calls with 4 day old baby and reports nipple pain today with pumping and feedings.  Mom is reporting bleeding nipples.  Mom is using NS given in hospital.  Mom reports breasts are full and hard.  LC discussed increase in milk volume and engorgement care.  Mom will work on engorgement plan and call back as needed.  [facility]

## 2016-07-26 ENCOUNTER — Ambulatory Visit: Payer: Self-pay | Admitting: Obstetrics and Gynecology

## 2018-03-25 ENCOUNTER — Encounter (HOSPITAL_COMMUNITY): Payer: Self-pay | Admitting: Emergency Medicine

## 2018-03-25 ENCOUNTER — Other Ambulatory Visit: Payer: Self-pay

## 2018-03-25 ENCOUNTER — Emergency Department (HOSPITAL_COMMUNITY)
Admission: EM | Admit: 2018-03-25 | Discharge: 2018-03-25 | Disposition: A | Discharge status: Home / Self Care | Payer: Self-pay | Attending: Emergency Medicine | Admitting: Emergency Medicine

## 2018-03-25 DIAGNOSIS — Z79899 Other long term (current) drug therapy: Secondary | ICD-10-CM | POA: Insufficient documentation

## 2018-03-25 DIAGNOSIS — Z87891 Personal history of nicotine dependence: Secondary | ICD-10-CM | POA: Insufficient documentation

## 2018-03-25 DIAGNOSIS — E119 Type 2 diabetes mellitus without complications: Secondary | ICD-10-CM | POA: Insufficient documentation

## 2018-03-25 DIAGNOSIS — K2900 Acute gastritis without bleeding: Secondary | ICD-10-CM | POA: Insufficient documentation

## 2018-03-25 LAB — COMPREHENSIVE METABOLIC PANEL
ALK PHOS: 69 U/L (ref 38–126)
ALT: 23 U/L (ref 0–44)
ANION GAP: 8 (ref 5–15)
AST: 26 U/L (ref 15–41)
Albumin: 3.6 g/dL (ref 3.5–5.0)
BILIRUBIN TOTAL: 0.4 mg/dL (ref 0.3–1.2)
BUN: 9 mg/dL (ref 6–20)
CALCIUM: 9.1 mg/dL (ref 8.9–10.3)
CO2: 25 mmol/L (ref 22–32)
Chloride: 105 mmol/L (ref 98–111)
Creatinine, Ser: 0.7 mg/dL (ref 0.44–1.00)
Glucose, Bld: 117 mg/dL — ABNORMAL HIGH (ref 70–99)
POTASSIUM: 3.8 mmol/L (ref 3.5–5.1)
Sodium: 138 mmol/L (ref 135–145)
Total Protein: 6.9 g/dL (ref 6.5–8.1)

## 2018-03-25 LAB — URINALYSIS, ROUTINE W REFLEX MICROSCOPIC
Bilirubin Urine: NEGATIVE
GLUCOSE, UA: NEGATIVE mg/dL
Hgb urine dipstick: NEGATIVE
Ketones, ur: NEGATIVE mg/dL
Nitrite: NEGATIVE
PH: 6 (ref 5.0–8.0)
Protein, ur: NEGATIVE mg/dL
SPECIFIC GRAVITY, URINE: 1.025 (ref 1.005–1.030)

## 2018-03-25 LAB — CBC WITH DIFFERENTIAL/PLATELET
ABS IMMATURE GRANULOCYTES: 0 10*3/uL (ref 0.0–0.1)
BASOS ABS: 0 10*3/uL (ref 0.0–0.1)
BASOS PCT: 0 %
EOS ABS: 0.2 10*3/uL (ref 0.0–0.7)
Eosinophils Relative: 2 %
HCT: 40.5 % (ref 36.0–46.0)
Hemoglobin: 12.9 g/dL (ref 12.0–15.0)
IMMATURE GRANULOCYTES: 0 %
Lymphocytes Relative: 22 %
Lymphs Abs: 2.2 10*3/uL (ref 0.7–4.0)
MCH: 26.8 pg (ref 26.0–34.0)
MCHC: 31.9 g/dL (ref 30.0–36.0)
MCV: 84 fL (ref 78.0–100.0)
Monocytes Absolute: 0.9 10*3/uL (ref 0.1–1.0)
Monocytes Relative: 9 %
NEUTROS ABS: 6.4 10*3/uL (ref 1.7–7.7)
NEUTROS PCT: 67 %
PLATELETS: 293 10*3/uL (ref 150–400)
RBC: 4.82 MIL/uL (ref 3.87–5.11)
RDW: 13.5 % (ref 11.5–15.5)
WBC: 9.6 10*3/uL (ref 4.0–10.5)

## 2018-03-25 LAB — I-STAT BETA HCG BLOOD, ED (MC, WL, AP ONLY): I-stat hCG, quantitative: <5 m[IU]/mL (ref <5)

## 2018-03-25 LAB — LIPASE, BLOOD: LIPASE: 33 U/L (ref 11–51)

## 2018-03-25 MED ORDER — RANITIDINE HCL 150 MG PO CAPS: 150.0000 mg | ORAL_CAPSULE | Freq: Every day | ORAL | 0 refills | Status: DC

## 2018-03-25 MED ORDER — GI COCKTAIL ~~LOC~~
30.0000 mL | Freq: Once | ORAL | Status: AC
  Administered 2018-03-25: 30 mL via ORAL
  Filled 2018-03-25: qty 30

## 2018-03-25 MED ORDER — OMEPRAZOLE 20 MG PO CPDR: 20.0000 mg | DELAYED_RELEASE_CAPSULE | Freq: Every day | ORAL | 0 refills | Status: DC

## 2018-03-25 NOTE — ED Provider Notes (Signed)
[facility] EMERGENCY DEPARTMENT Provider Note   CSN: [id number] Arrival date & time: 03/25/18  1652   History   Chief Complaint Chief Complaint  Patient presents with  . Abdominal Pain    HPI Perline Awe Bradly Bienenstock is a 32 y.o. female.  HPI   32 year old female presents today with complaints of abdominal pain.  Patient notes a 4-day history of epigastric abdominal pain.  She notes the pain is somewhat constant but worsened after eating.  She notes she is able to eat but approximately 10 minutes later develops worsening pain.  Patient notes several episodes of vomiting after eating.  Patient denies any lower abdominal pain, fever.  She does note that her last few bowel movements have been loose.  No significant alcohol intake.  She has been using ibuprofen at home as needed for pain.    Past Medical History:  Diagnosis Date  . Depression   . Diabetes mellitus without complication (HCC)    diet controlled  . Gestational diabetes   . Teeth problem    cracked molars, pt unsure if just right side or both sides    Patient Active Problem List   Diagnosis Date Noted  . Gestational diabetes 06/23/2016  . Gestational diabetes mellitus (GDM) affecting pregnancy, antepartum 06/20/2016  . Supervision of normal pregnancy, antepartum 04/21/2016  . Abnormal Pap smear of cervix 04/21/2016  . Unspecified symptom associated with female genital organs 04/07/2013  . Urinary tract infection, site not specified 12/05/2012  . ALLERGIC RHINITIS 05/31/2007  . ABDOMINAL PAIN OTHER SPECIFIED SITE 05/31/2007    Past Surgical History:  Procedure Laterality Date  . NO PAST SURGERIES       OB History    Gravida  5   Para  2   Term  2   Preterm  0   AB  3   Living  2     SAB  3   TAB  0   Ectopic  0   Multiple  0   Live Births  2            Home Medications    Prior to Admission medications   Medication Sig Start Date End Date Taking? Authorizing  Provider  ibuprofen (ADVIL,MOTRIN) 600 MG tablet Take 1 tablet (600 mg total) by mouth every 6 (six) hours. 06/25/16   Arabella Merles, CNM  omeprazole (PRILOSEC) 20 MG capsule Take 1 capsule (20 mg total) by mouth daily. 03/25/18   Larue Drawdy, Tinnie Gens, PA-C  Prenatal Vit-Fe Phos-FA-Omega (VITAFOL GUMMIES) 3.33-0.333-34.8 MG CHEW Chew 1 each by mouth 3 (three) times daily.    [provider]    Family History No family history on file.  Social History Social History   Tobacco Use  . Smoking status: Former Smoker    Types: Cigarettes  . Smokeless tobacco: Never Used  Substance Use Topics  . Alcohol use: No  . Drug use: No     Allergies   Patient has no known allergies.   Review of Systems Review of Systems  Physical Exam Updated Vital Signs BP 117/76   Pulse 89   Temp 98.1 F (36.7 C) (Oral)   Resp 16   SpO2 99%   Physical Exam  Constitutional: She is oriented to person, place, and time. She appears well-developed and well-nourished.  HENT:  Head: Normocephalic and atraumatic.  Eyes: Pupils are equal, round, and reactive to light. Conjunctivae are normal. Right eye exhibits no discharge. Left eye exhibits no discharge.  No scleral icterus.  Neck: Normal range of motion. No JVD present. No tracheal deviation present.  Pulmonary/Chest: Effort normal. No stridor.  Abdominal:  Minor tenderness to palpation of the epigastric region, remainder of abdomen soft nontender no right upper quadrant tenderness palpation  Neurological: She is alert and oriented to person, place, and time. Coordination normal.  Psychiatric: She has a normal mood and affect. Her behavior is normal. Judgment and thought content normal.  Nursing note and vitals reviewed.   ED Treatments / Results  Labs (all labs ordered are listed, but only abnormal results are displayed) Labs Reviewed  COMPREHENSIVE METABOLIC PANEL - Abnormal; Notable for the following components:      Result Value    Glucose, Bld 117 (*)    All other components within normal limits  URINALYSIS, ROUTINE W REFLEX MICROSCOPIC - Abnormal; Notable for the following components:   APPearance HAZY (*)    Leukocytes, UA SMALL (*)    Bacteria, UA RARE (*)    All other components within normal limits  LIPASE, BLOOD  CBC WITH DIFFERENTIAL/PLATELET  I-STAT BETA HCG BLOOD, ED ([facility], [facility], [facility] ONLY)    EKG None  Radiology No results found.  Procedures Procedures (including critical care time)  Medications Ordered in ED Medications  gi cocktail (Maalox,Lidocaine,Donnatal) (30 mLs Oral Given 03/25/18 1952)     Initial Impression / Assessment and Plan / ED Course  I have reviewed the triage vital signs and the nursing notes.  Pertinent labs & imaging results that were available during my care of the patient were reviewed by me and considered in my medical decision making (see chart for details).     Labs: I stat beta hcg, CMP, Lipase, CBC, urinalysis  Imaging:  Consults:  Therapeutics: GI cocktail   Discharge Meds: Zantac, Prilosec  Assessment/Plan: 32 year old female presents today with likely gastritis.  She has pain in her epigastric region, reassuring laboratory analysis, improvement with GI cocktail.  Patient has no signs of cholecystitis or gallbladder disease at this time.  She has no other abdominal pain.  Patient will be treated with Zantac followed by proton pump inhibitor.  Patient will follow-up as an outpatient for ongoing evaluation management, she is given strict return precautions, she verbalized understanding and agreement to today's plan.   Final Clinical Impressions(s) / ED Diagnoses   Final diagnoses:  Acute gastritis without hemorrhage, unspecified gastritis type    ED Discharge Orders         Ordered    omeprazole (PRILOSEC) 20 MG capsule  Daily,   Status:  Discontinued     03/25/18 2125    ranitidine (ZANTAC) 150 MG capsule  Daily,   Status:  Discontinued     03/25/18 2125     omeprazole (PRILOSEC) 20 MG capsule  Daily     03/25/18 2128           Eyvonne MechanicHedges, Kaliegh Willadsen, PA-C 03/25/18 2143    Little, Ambrose Finlandachel Morgan, MD 03/27/18 [phone]

## 2018-03-25 NOTE — ED Triage Notes (Signed)
Pt c/o upper abdominal pain, worse after eating. Denies nausea/vomiting. Also c/o pain with urination.

## 2018-03-25 NOTE — ED Provider Notes (Signed)
Patient placed in Quick Look pathway, seen and evaluated   Chief Complaint: Abd pain  HPI:   32 y.o. F who presents for evaluation of epigastric abdominal pain and nausea/vomiting that began 4 days ago.  She states the pain is worse after eating.  She reports that she has some episodes of vomiting after eating.  She states that the pain is intermittent.  She is taking ibuprofen at home.  Denies any fevers, urinary complaints.  ROS: Abd pain  Physical Exam:   Gen: No distress  Neuro: Awake and Alert  Skin: Warm    Focused Exam: TTP to epigastric abdominal region. No rigidity, no guarding.    Initiation of care has begun. The patient has been counseled on the process, plan, and necessity for staying for the completion/evaluation, and the remainder of the medical screening examination    Maxwell CaulLayden, Lindsey A, PA-C 03/25/18 1719    Rolan BuccoBelfi, Melanie, MD 03/25/18 2230

## 2018-03-25 NOTE — Discharge Instructions (Addendum)
Please read attached information. If you experience any new or worsening signs or symptoms please return to the emergency room for evaluation. Please follow-up with your primary care provider or specialist as discussed. Please use medication prescribed only as directed and discontinue taking if you have any concerning signs or symptoms.  Please use Zantac as needed for symptoms. If your symptoms are not improving with diet modification and Pepcid use pleas stop Zantac and start omeprazole daily.

## 2019-03-18 ENCOUNTER — Other Ambulatory Visit: Payer: Self-pay

## 2019-03-18 DIAGNOSIS — Z20822 Contact with and (suspected) exposure to covid-19: Secondary | ICD-10-CM

## 2019-03-19 LAB — NOVEL CORONAVIRUS, NAA: SARS-CoV-2, NAA: NOT DETECTED

## 2019-06-08 ENCOUNTER — Other Ambulatory Visit: Payer: Self-pay

## 2019-06-08 ENCOUNTER — Inpatient Hospital Stay (HOSPITAL_COMMUNITY)
Admission: AD | Admit: 2019-06-08 | Discharge: 2019-06-09 | Disposition: A | Discharge status: Home / Self Care | Payer: Self-pay | Attending: Obstetrics & Gynecology | Admitting: Obstetrics & Gynecology

## 2019-06-08 ENCOUNTER — Encounter (HOSPITAL_COMMUNITY): Payer: Self-pay

## 2019-06-08 DIAGNOSIS — Z3A01 Less than 8 weeks gestation of pregnancy: Secondary | ICD-10-CM | POA: Insufficient documentation

## 2019-06-08 DIAGNOSIS — O99341 Other mental disorders complicating pregnancy, first trimester: Secondary | ICD-10-CM | POA: Insufficient documentation

## 2019-06-08 DIAGNOSIS — Z87891 Personal history of nicotine dependence: Secondary | ICD-10-CM | POA: Insufficient documentation

## 2019-06-08 DIAGNOSIS — O26899 Other specified pregnancy related conditions, unspecified trimester: Secondary | ICD-10-CM

## 2019-06-08 DIAGNOSIS — O26891 Other specified pregnancy related conditions, first trimester: Secondary | ICD-10-CM

## 2019-06-08 DIAGNOSIS — F419 Anxiety disorder, unspecified: Secondary | ICD-10-CM | POA: Insufficient documentation

## 2019-06-08 DIAGNOSIS — R109 Unspecified abdominal pain: Secondary | ICD-10-CM

## 2019-06-08 DIAGNOSIS — R1084 Generalized abdominal pain: Secondary | ICD-10-CM | POA: Insufficient documentation

## 2019-06-08 LAB — POCT PREGNANCY, URINE: Preg Test, Ur: POSITIVE — AB

## 2019-06-08 NOTE — MAU Provider Note (Signed)
History     CSN: [id number]  Arrival date and time: 06/08/19 2323   None     Chief Complaint  Patient presents with  . Abdominal Pain   Tracy [name] is a 33 yo [id number] at [redacted] weeks EGA who is presenting to MAU complaining of diffuse generalized abdominal pain around 2000 hours. She had had some nausea, and only one episode of emesis. She reports an increase in vaginal mucous, but denies discharge or vaginal bleeding. She reports that her LMP was 05/14/19. She denies fevers, chills, or malaise. She does endorse a great deal of anxiety, and she is concerned about miscarriage or ectopic pregnancy.  She reports a total of 6 home pregnancy tests in the last couple of weeks and history of 3 miscarriages.    OB History    Gravida  6   Para  2   Term  2   Preterm  0   AB  3   Living  2     SAB  3   TAB  0   Ectopic  0   Multiple  0   Live Births  2           Past Medical History:  Diagnosis Date  . Depression   . Diabetes mellitus without complication (HCC)    diet controlled  . Gestational diabetes   . Teeth problem    cracked molars, pt unsure if just right side or both sides    Past Surgical History:  Procedure Laterality Date  . NO PAST SURGERIES      No family history on file.  Social History   Tobacco Use  . Smoking status: Former Smoker    Types: Cigarettes  . Smokeless tobacco: Never Used  Substance Use Topics  . Alcohol use: No  . Drug use: No    Allergies: No Known Allergies  Medications Prior to Admission  Medication Sig Dispense Refill Last Dose  . ibuprofen (ADVIL,MOTRIN) 600 MG tablet Take 1 tablet (600 mg total) by mouth every 6 (six) hours. 30 tablet 0   . omeprazole (PRILOSEC) 20 MG capsule Take 1 capsule (20 mg total) by mouth daily. 30 capsule 0   . Prenatal Vit-Fe Phos-FA-Omega (VITAFOL GUMMIES) 3.33-0.333-34.8 MG CHEW Chew 1 each by mouth 3 (three) times daily.       Review of Systems  All other systems reviewed and are  negative.  Physical Exam   Blood pressure 133/79, pulse (!) 103, temperature 98.4 F (36.9 C), temperature source Oral, resp. rate 19, height 4\' 8"  (1.422 m), weight 69 kg, last menstrual period 05/14/2019, SpO2 99 %, unknown if currently breastfeeding.  Physical Exam  Nursing note and vitals reviewed. Constitutional: She is oriented to person, place, and time. She appears well-developed and well-nourished.  HENT:  Head: Normocephalic and atraumatic.  Eyes: Pupils are equal, round, and reactive to light. Conjunctivae and EOM are normal.  Neck: Normal range of motion. Neck supple.  Cardiovascular: Normal rate, regular rhythm, normal heart sounds and intact distal pulses.  Respiratory: Effort normal and breath sounds normal.  GI: Soft. Bowel sounds are normal. She exhibits no distension and no mass. There is abdominal tenderness (mild tenderness on the lower right and upper right quadrants). There is no rebound and no guarding.  Genitourinary:    Vagina and uterus normal.   Musculoskeletal: Normal range of motion.  Neurological: She is alert and oriented to person, place, and time. She has normal reflexes.  Skin: Skin is warm  and dry.  Psychiatric: She has a normal mood and affect. Her behavior is normal. Judgment and thought content normal.  Somewhat tearful    MAU Course  Procedures  MDM -CMP, CBC -[place] to r/o ectopic pregnancy -Wet Prep, UA -Oxycodone for pain  US Ob Comp Less 14 Wks  Result Date: 06/09/2019 CLINICAL DATA:  Abdominal pain EXAM: OBSTETRIC <14 WK ULTRASOUND TECHNIQUE: Transabdominal ultrasound was performed for evaluation of the gestation as well as the maternal uterus and adnexal regions. COMPARISON:  None. FINDINGS: Intrauterine gestational sac: Single Yolk sac:  Visualized. Embryo:  Visualized. Cardiac Activity: Visualized. Heart Rate: 164 bpm CRL:   10.4 mm 7  w 1  d                  [place] EDC: 01/25/2020 Subchorionic hemorrhage:  None visualized. Maternal  uterus/adnexae: There is a probable corpus luteum cyst seen within the left ovary. The right ovary is normal in appearance. IMPRESSION: Single live intrauterine pregnancy measuring 7 weeks 1 day Electronically Signed   By: Prudencio Pair M.D.   On: 06/09/2019 01:09   Results for orders placed or performed during the hospital encounter of 06/08/19 (from the past 24 hour(s))  Pregnancy, urine POC     Status: Abnormal   Collection Time: 06/08/19 11:45 PM  Result Value Ref Range   Preg Test, Ur POSITIVE (A) NEGATIVE  Urinalysis, Routine w reflex microscopic     Status: Abnormal   Collection Time: 06/08/19 11:48 PM  Result Value Ref Range   Color, Urine COLORLESS (A) YELLOW   APPearance CLEAR CLEAR   Specific Gravity, Urine 1.004 (L) 1.005 - 1.030   pH 7.0 5.0 - 8.0   Glucose, UA NEGATIVE NEGATIVE mg/dL   Hgb urine dipstick NEGATIVE NEGATIVE   Bilirubin Urine NEGATIVE NEGATIVE   Ketones, ur NEGATIVE NEGATIVE mg/dL   Protein, ur NEGATIVE NEGATIVE mg/dL   Nitrite NEGATIVE NEGATIVE   Leukocytes,Ua NEGATIVE NEGATIVE  Wet prep, genital     Status: Abnormal   Collection Time: 06/09/19 12:08 AM  Result Value Ref Range   Yeast Wet Prep HPF POC NONE SEEN NONE SEEN   Trich, Wet Prep NONE SEEN NONE SEEN   Clue Cells Wet Prep HPF POC NONE SEEN NONE SEEN   WBC, Wet Prep HPF POC FEW (A) NONE SEEN   Sperm NONE SEEN   Comprehensive metabolic panel     Status: Abnormal   Collection Time: 06/09/19 12:38 AM  Result Value Ref Range   Sodium 137 135 - 145 mmol/L   Potassium 3.8 3.5 - 5.1 mmol/L   Chloride 106 98 - 111 mmol/L   CO2 22 22 - 32 mmol/L   Glucose, Bld 124 (H) 70 - 99 mg/dL   BUN 9 6 - 20 mg/dL   Creatinine, Ser 0.51 0.44 - 1.00 mg/dL   Calcium 8.9 8.9 - 10.3 mg/dL   Total Protein 6.4 (L) 6.5 - 8.1 g/dL   Albumin 3.2 (L) 3.5 - 5.0 g/dL   AST 16 15 - 41 U/L   ALT 14 0 - 44 U/L   Alkaline Phosphatase 58 38 - 126 U/L   Total Bilirubin 0.3 0.3 - 1.2 mg/dL   GFR calc non Af Amer >60 >60  mL/min   GFR calc Af Amer >60 >60 mL/min   Anion gap 9 5 - 15  CBC     Status: Abnormal   Collection Time: 06/09/19 12:38 AM  Result Value Ref Range   WBC 11.7 (  H) 4.0 - 10.5 K/uL   RBC 4.47 3.87 - 5.11 MIL/uL   Hemoglobin 12.0 12.0 - 15.0 g/dL   HCT 40.937.0 81.136.0 - 91.446.0 %   MCV 82.8 80.0 - 100.0 fL   MCH 26.8 26.0 - 34.0 pg   MCHC 32.4 30.0 - 36.0 g/dL   RDW 78.213.4 95.611.5 - 21.315.5 %   Platelets 316 150 - 400 K/uL   nRBC 0.0 0.0 - 0.2 %    Assessment and Plan  33 yo [id number] at 7.1 EGA by first trimester US with generalized abdominal pain during pregnancy -Lab results normal -Anxiety resolved with US pictures, US showed SIUP -Abdominal pain much improved with oxycodone, script sent for breakthrough pain -Advised can take tylenol for pain as needed -Return precautions given  Gaylynn Seiple L Reverie Vaquera 06/08/2019, 11:59 PM

## 2019-06-08 NOTE — MAU Note (Signed)
Patient presents to MAU c/o lower and mid abdominal pain that started around 2000.  Patient reports 6 positive home preg tests, but states can't remember what day.  Patient reports hx of 3 misarranges. Patient denies vaginal bleeding,  "but I have  mucus when I wipe"  Patient reports LMP 05/14/2019

## 2019-06-09 ENCOUNTER — Inpatient Hospital Stay (HOSPITAL_COMMUNITY): Payer: Self-pay

## 2019-06-09 LAB — CBC
HCT: 37 % (ref 36.0–46.0)
Hemoglobin: 12 g/dL (ref 12.0–15.0)
MCH: 26.8 pg (ref 26.0–34.0)
MCHC: 32.4 g/dL (ref 30.0–36.0)
MCV: 82.8 fL (ref 80.0–100.0)
Platelets: 316 10*3/uL (ref 150–400)
RBC: 4.47 MIL/uL (ref 3.87–5.11)
RDW: 13.4 % (ref 11.5–15.5)
WBC: 11.7 10*3/uL — ABNORMAL HIGH (ref 4.0–10.5)
nRBC: 0 % (ref 0.0–0.2)

## 2019-06-09 LAB — COMPREHENSIVE METABOLIC PANEL
ALT: 14 U/L (ref 0–44)
AST: 16 U/L (ref 15–41)
Albumin: 3.2 g/dL — ABNORMAL LOW (ref 3.5–5.0)
Alkaline Phosphatase: 58 U/L (ref 38–126)
Anion gap: 9 (ref 5–15)
BUN: 9 mg/dL (ref 6–20)
CO2: 22 mmol/L (ref 22–32)
Calcium: 8.9 mg/dL (ref 8.9–10.3)
Chloride: 106 mmol/L (ref 98–111)
Creatinine, Ser: 0.51 mg/dL (ref 0.44–1.00)
GFR calc Af Amer: >60 mL/min (ref >60)
GFR calc non Af Amer: >60 mL/min (ref >60)
Glucose, Bld: 124 mg/dL — ABNORMAL HIGH (ref 70–99)
Potassium: 3.8 mmol/L (ref 3.5–5.1)
Sodium: 137 mmol/L (ref 135–145)
Total Bilirubin: 0.3 mg/dL (ref 0.3–1.2)
Total Protein: 6.4 g/dL — ABNORMAL LOW (ref 6.5–8.1)

## 2019-06-09 LAB — URINALYSIS, ROUTINE W REFLEX MICROSCOPIC
Bilirubin Urine: NEGATIVE
Glucose, UA: NEGATIVE mg/dL
Hgb urine dipstick: NEGATIVE
Ketones, ur: NEGATIVE mg/dL
Leukocytes,Ua: NEGATIVE
Nitrite: NEGATIVE
Protein, ur: NEGATIVE mg/dL
Specific Gravity, Urine: 1.004 — ABNORMAL LOW (ref 1.005–1.030)
pH: 7 (ref 5.0–8.0)

## 2019-06-09 LAB — WET PREP, GENITAL
Clue Cells Wet Prep HPF POC: NONE SEEN
Sperm: NONE SEEN
Trich, Wet Prep: NONE SEEN
Yeast Wet Prep HPF POC: NONE SEEN

## 2019-06-09 MED ORDER — OXYCODONE HCL 5 MG PO TABS: 5.0000 mg | ORAL_TABLET | Freq: Three times a day (TID) | ORAL | 0 refills | Status: DC | PRN

## 2019-06-09 MED ORDER — OXYCODONE HCL 5 MG PO TABS
5.0000 mg | ORAL_TABLET | Freq: Once | ORAL | Status: AC
  Administered 2019-06-09: 5 mg via ORAL
  Filled 2019-06-09: qty 1

## 2019-06-09 MED ORDER — ACETAMINOPHEN 325 MG PO TABS: 650.0000 mg | ORAL_TABLET | Freq: Three times a day (TID) | ORAL | 0 refills | Status: DC | PRN

## 2019-06-09 NOTE — Discharge Instructions (Signed)
Dolor abdominal durante el embarazo Abdominal Pain During Pregnancy  El dolor abdominal es comn durante el embarazo y tiene muchas causas posibles. Algunas causas son ms graves que otras, y a Curator causa se desconoce. El dolor abdominal puede ser un indicio de que est comenzando el [place]. Tambin puede ser ocasionado por el crecimiento y estiramiento de los msculos y ligamentos durante el Media planner. Siempre informe a su mdico si siente dolor abdominal. Siga estas indicaciones en su casa:  No tenga relaciones sexuales ni se coloque nada dentro de la vagina hasta que el dolor haya desaparecido completamente.  Descanse todo lo que pueda [employer] dolor se le haya calmado.  Beba suficiente lquido para Consulting civil engineer orina de color amarillo plido.  Tome los medicamentos de venta libre y los recetados solamente como se lo haya indicado el mdico.  Consulting civil engineer a todas las visitas de control como se lo haya indicado el mdico. Esto es importante. Comunquese con un mdico si:  El dolor contina o empeora despus de Production assistant, radio.  Siente dolor en la parte inferior del abdomen que: ? Va y viene en intervalos regulares. ? Se extiende a la espalda. ? Es parecido a los Stage manager.  Siente dolor o ardor al Garment/textile technologist. Solicite ayuda de inmediato si:  Tiene fiebre o siente escalofros.  Tiene una hemorragia vaginal abundante.  Tiene una prdida de lquido por la vagina.  Elimina tejidos por la vagina.  Vomita o tiene diarrea durante ms de 24horas.  El beb se mueve menos de lo habitual.  Se siente dbil o se desmaya.  Le falta el aire.  Siente dolor intenso en la parte superior del abdomen. Resumen  El dolor abdominal es comn durante el [place] y tiene muchas causas posibles.  Si siente dolor abdominal durante el embarazo, informe al mdico de inmediato.  Siga las indicaciones del mdico para el cuidado en el hogar y concurra a todas las visitas de control como se lo  hayan indicado. Esta informacin no tiene Marine scientist el consejo del mdico. Asegrese de hacerle al mdico cualquier pregunta que tenga. Document Released: 07/17/2005 Document Revised: 01/09/2017 Document Reviewed: 01/09/2017 Elsevier Patient Education  2020 [employer].

## 2019-07-19 ENCOUNTER — Other Ambulatory Visit: Payer: Self-pay

## 2019-07-19 ENCOUNTER — Encounter (HOSPITAL_COMMUNITY): Payer: Self-pay | Admitting: Obstetrics and Gynecology

## 2019-07-19 ENCOUNTER — Inpatient Hospital Stay (HOSPITAL_COMMUNITY)
Admission: AD | Admit: 2019-07-19 | Discharge: 2019-07-19 | Disposition: A | Discharge status: Home / Self Care | Payer: Self-pay | Attending: Obstetrics and Gynecology | Admitting: Obstetrics and Gynecology

## 2019-07-19 DIAGNOSIS — Z87891 Personal history of nicotine dependence: Secondary | ICD-10-CM | POA: Insufficient documentation

## 2019-07-19 DIAGNOSIS — Z79899 Other long term (current) drug therapy: Secondary | ICD-10-CM | POA: Insufficient documentation

## 2019-07-19 DIAGNOSIS — Z3A12 12 weeks gestation of pregnancy: Secondary | ICD-10-CM | POA: Insufficient documentation

## 2019-07-19 DIAGNOSIS — O24410 Gestational diabetes mellitus in pregnancy, diet controlled: Secondary | ICD-10-CM | POA: Insufficient documentation

## 2019-07-19 DIAGNOSIS — O98811 Other maternal infectious and parasitic diseases complicating pregnancy, first trimester: Secondary | ICD-10-CM

## 2019-07-19 DIAGNOSIS — O23591 Infection of other part of genital tract in pregnancy, first trimester: Secondary | ICD-10-CM | POA: Insufficient documentation

## 2019-07-19 DIAGNOSIS — B379 Candidiasis, unspecified: Secondary | ICD-10-CM | POA: Insufficient documentation

## 2019-07-19 LAB — URINALYSIS, ROUTINE W REFLEX MICROSCOPIC
Bilirubin Urine: NEGATIVE
Glucose, UA: NEGATIVE mg/dL
Hgb urine dipstick: NEGATIVE
Ketones, ur: NEGATIVE mg/dL
Leukocytes,Ua: NEGATIVE
Nitrite: NEGATIVE
Protein, ur: NEGATIVE mg/dL
Specific Gravity, Urine: 1.012 (ref 1.005–1.030)
pH: 6 (ref 5.0–8.0)

## 2019-07-19 LAB — WET PREP, GENITAL
Clue Cells Wet Prep HPF POC: NONE SEEN
Sperm: NONE SEEN
Trich, Wet Prep: NONE SEEN

## 2019-07-19 MED ORDER — TERCONAZOLE 0.4 % VA CREA: 1.0000 | TOPICAL_CREAM | Freq: Every day | VAGINAL | 0 refills | Status: DC

## 2019-07-19 NOTE — MAU Provider Note (Signed)
Patient Tracy [name] is a 33 y.o. [id number] At [redacted]w[redacted]d here with complaints of abdominal pain that started last night. She denies abnormal vaginal discharge, VB, LOF. She denies diarrhea, constipation. She has had all vaginal births in the past. She denies history of PTB. She endorses some discomfort with she urinates; she had intercourse last night and the pain started after that, around 10 pm.  History     CSN: [id number]  Arrival date and time: 07/19/19 1129   First Provider Initiated Contact with Patient 07/19/19 1241      Chief Complaint  Patient presents with  . Abdominal Pain  . Dysuria  . Urinary Frequency   Abdominal Pain This is a new problem. The current episode started yesterday. The problem has been unchanged. The pain is located in the periumbilical region and suprapubic region. The pain is at a severity of 7/10. The quality of the pain is cramping. Nothing aggravates the pain. The pain is relieved by being still. She has tried acetaminophen for the symptoms.    OB History    Gravida  6   Para  2   Term  2   Preterm  0   AB  3   Living  2     SAB  3   TAB  0   Ectopic  0   Multiple  0   Live Births  2           Past Medical History:  Diagnosis Date  . Depression   . Diabetes mellitus without complication (HCC)    diet controlled  . Gestational diabetes   . Teeth problem    cracked molars, pt unsure if just right side or both sides    Past Surgical History:  Procedure Laterality Date  . NO PAST SURGERIES      History reviewed. No pertinent family history.  Social History   Tobacco Use  . Smoking status: Former Smoker    Types: Cigarettes  . Smokeless tobacco: Never Used  Substance Use Topics  . Alcohol use: No  . Drug use: No    Allergies: No Known Allergies  Medications Prior to Admission  Medication Sig Dispense Refill Last Dose  . acetaminophen (TYLENOL) 325 MG tablet Take 2 tablets (650 mg total) by mouth every  8 (eight) hours as needed. 30 tablet 0   . omeprazole (PRILOSEC) 20 MG capsule Take 1 capsule (20 mg total) by mouth daily. 30 capsule 0   . oxyCODONE (ROXICODONE) 5 MG immediate release tablet Take 1 tablet (5 mg total) by mouth every 8 (eight) hours as needed. 10 tablet 0   . Prenatal Vit-Fe Phos-FA-Omega (VITAFOL GUMMIES) 3.33-0.333-34.8 MG CHEW Chew 1 each by mouth 3 (three) times daily.       Review of Systems  Constitutional: Negative.   HENT: Negative.   Gastrointestinal: Positive for abdominal pain.  Genitourinary: Negative.   Musculoskeletal: Negative.   Neurological: Negative.   Psychiatric/Behavioral: Negative.    Physical Exam   Blood pressure 117/71, pulse 97, temperature 98.1 F (36.7 C), temperature source Oral, resp. rate 16, height 4\' 11"  (1.499 m), weight 67.7 kg, last menstrual period 05/14/2019, SpO2 98 %, unknown if currently breastfeeding.  Physical Exam  Constitutional: She is oriented to person, place, and time. She appears well-developed.  HENT:  Head: Normocephalic.  Respiratory: Effort normal.  GI: Soft.  Genitourinary:    Genitourinary Comments: NEFG; no lesions on external labia. Cervix is long, closed, thick; no CMT, suprapubic  or adnexal tenderness.    Musculoskeletal:        General: Normal range of motion.     Cervical back: Normal range of motion.  Neurological: She is alert and oriented to person, place, and time.  Skin: Skin is warm and dry.    MAU Course  Procedures  MDM -FHR is 161 by Doppler -UA is completely clear, no signs of infection -wet prep shows yeast -GC CT pending  Assessment and Plan   1. Yeast infection   -Rx for Terazol given. Patient can also take Monistat if she wishes.   -outpatient MFM scan ordered -She plans to start prenatal care at [facility]; she is waiting on her Medicaid card Mervyn Skeeters Kooistra 07/19/2019, 12:45 PM

## 2019-07-19 NOTE — Discharge Instructions (Signed)
Vaginal Yeast infection, Adult  Vaginal yeast infection is a condition that causes vaginal discharge as well as soreness, swelling, and redness (inflammation) of the vagina. This is a common condition. Some women get this infection frequently. What are the causes? This condition is caused by a change in the normal balance of the yeast (candida) and bacteria that live in the vagina. This change causes an overgrowth of yeast, which causes the inflammation. What increases the risk? The condition is more likely to develop in women who:  Take antibiotic medicines.  Have diabetes.  Take birth control pills.  Are pregnant.  Douche often.  Have a weak body defense system (immune system).  Have been taking steroid medicines for a long time.  Frequently wear tight clothing. What are the signs or symptoms? Symptoms of this condition include:  White, thick, creamy vaginal discharge.  Swelling, itching, redness, and irritation of the vagina. The lips of the vagina (vulva) may be affected as well.  Pain or a burning feeling while urinating.  Pain during sex. How is this diagnosed? This condition is diagnosed based on:  Your medical history.  A physical exam.  A pelvic exam. Your health care provider will examine a sample of your vaginal discharge under a microscope. Your health care provider may send this sample for testing to confirm the diagnosis. How is this treated? This condition is treated with medicine. Medicines may be over-the-counter or prescription. You may be told to use one or more of the following:  Medicine that is taken by mouth (orally).  Medicine that is applied as a cream (topically).  Medicine that is inserted directly into the vagina (suppository). Follow these instructions at home:  Lifestyle  Do not have sex until your health care provider approves. Tell your sex partner that you have a yeast infection. That person should go to his or her health care  provider and ask if they should also be treated.  Do not wear tight clothes, such as pantyhose or tight pants.  Wear breathable cotton underwear. General instructions  Take or apply over-the-counter and prescription medicines only as told by your health care provider.  Eat more yogurt. This may help to keep your yeast infection from returning.  Do not use tampons until your health care provider approves.  Try taking a sitz bath to help with discomfort. This is a warm water bath that is taken while you are sitting down. The water should only come up to your hips and should cover your buttocks. Do this 3-4 times per day or as told by your health care provider.  Do not douche.  If you have diabetes, keep your blood sugar levels under control.  Keep all follow-up visits as told by your health care provider. This is important. Contact a health care provider if:  You have a fever.  Your symptoms go away and then return.  Your symptoms do not get better with treatment.  Your symptoms get worse.  You have new symptoms.  You develop blisters in or around your vagina.  You have blood coming from your vagina and it is not your menstrual period.  You develop pain in your abdomen. Summary  Vaginal yeast infection is a condition that causes discharge as well as soreness, swelling, and redness (inflammation) of the vagina.  This condition is treated with medicine. Medicines may be over-the-counter or prescription.  Take or apply over-the-counter and prescription medicines only as told by your health care provider.  Do not douche.   Do not have sex or use tampons until your health care provider approves.  Contact a health care provider if your symptoms do not get better with treatment or your symptoms go away and then return. This information is not intended to replace advice given to you by your health care provider. Make sure you discuss any questions you have with your health care  provider. Document Released: 04/26/2005 Document Revised: 12/03/2017 Document Reviewed: 12/03/2017 Elsevier Patient Education  2020 Elsevier Inc.  

## 2019-07-19 NOTE — MAU Note (Signed)
Tracy [name] is a 33 y.o. at [redacted]w[redacted]d here in MAU reporting: lower abdominal pain since last night. Took 325 of tylenol and that relieved the pain but then it came back. No bleeding or discharge. Is having some burning with urination and urinary frequency.  Onset of complaint: yesterday  Pain score: 8/10  Vitals:   07/19/19 1145  BP: 117/71  Pulse: 97  Resp: 16  Temp: 98.1 F (36.7 C)  SpO2: 98%     Lab orders placed from triage: UA

## 2019-07-21 LAB — GC/CHLAMYDIA PROBE AMP (~~LOC~~) NOT AT ARMC
Chlamydia: NEGATIVE
Comment: NEGATIVE
Comment: NORMAL
Neisseria Gonorrhea: NEGATIVE

## 2019-08-01 NOTE — L&D Delivery Note (Addendum)
OB/GYN Faculty Practice Delivery Note  Tracy [name] Bradly Bienenstock is a 34 y.o. [id number] s/p SVD at [redacted]w[redacted]d. She was admitted for IOL for GDMA2, LGA.   ROM: 5h 36m with clear fluid GBS Status: Negative Maximum Maternal Temperature: 98.6*F  Labor Progress: . Had outpatient foley bulb.  Started on pitocin.  Had AROM with clear fluid.  Received epidural.  Progressed to complete without complication.  Delivery Date/Time: 01/18/2020 at 2030 Delivery: Called to room and patient was complete and pushing. Head delivered ROA. No nuchal cord present. Shoulder and body delivered in usual fashion. Infant with spontaneous cry, placed on mother's abdomen, dried and stimulated. Cord clamped x 2 after 1-minute delay, and cut by FOB under my direct supervision. Cord blood drawn. Placenta delivered spontaneously with gentle cord traction. Fundus firm with massage and Pitocin. Labia, perineum, vagina, and cervix were inspected, found to have a small 1st degree perineal laceration that was repaired with 3-0 Vicryl Rapide in the usual fashion with hemostasis noted afterward.   Placenta: Intact, 3 vessel cord, to L&D Complications: None Lacerations: 1st degree perineal QBL: 162 Analgesia: Epidural, lidocaine for local anesthesia for repair  Postpartum Planning [x]  message to sent to schedule follow-up  [x]  vaccines UTD  Infant: Viable female  APGARs 8/9  weight per medical record   EMILY , MD PGY-2 Resident, Family Medicine 01/18/2020, 8:46 PM  GME ATTESTATION:  I saw and evaluated the patient. I was gloved and supervised the resident throughout the delivery and perineal repair. I agree with the findings and the plan of care as documented in the resident's note.  Genene Churn, DO OB Fellow, Faculty [facility], [facility] 01/18/2020 9:04 PM

## 2019-08-24 ENCOUNTER — Other Ambulatory Visit: Payer: Self-pay

## 2019-08-24 ENCOUNTER — Encounter (HOSPITAL_COMMUNITY): Payer: Self-pay | Admitting: Obstetrics & Gynecology

## 2019-08-24 ENCOUNTER — Inpatient Hospital Stay (HOSPITAL_COMMUNITY)
Admission: AD | Admit: 2019-08-24 | Discharge: 2019-08-25 | Disposition: A | Discharge status: Home / Self Care | Payer: Self-pay | Attending: Obstetrics & Gynecology | Admitting: Obstetrics & Gynecology

## 2019-08-24 DIAGNOSIS — M545 Low back pain, unspecified: Secondary | ICD-10-CM

## 2019-08-24 DIAGNOSIS — M62838 Other muscle spasm: Secondary | ICD-10-CM | POA: Insufficient documentation

## 2019-08-24 DIAGNOSIS — R109 Unspecified abdominal pain: Secondary | ICD-10-CM

## 2019-08-24 DIAGNOSIS — O26899 Other specified pregnancy related conditions, unspecified trimester: Secondary | ICD-10-CM

## 2019-08-24 DIAGNOSIS — R102 Pelvic and perineal pain: Secondary | ICD-10-CM | POA: Insufficient documentation

## 2019-08-24 DIAGNOSIS — Z3A18 18 weeks gestation of pregnancy: Secondary | ICD-10-CM | POA: Insufficient documentation

## 2019-08-24 DIAGNOSIS — O26892 Other specified pregnancy related conditions, second trimester: Secondary | ICD-10-CM | POA: Insufficient documentation

## 2019-08-24 DIAGNOSIS — Z87891 Personal history of nicotine dependence: Secondary | ICD-10-CM | POA: Insufficient documentation

## 2019-08-24 LAB — URINALYSIS, ROUTINE W REFLEX MICROSCOPIC
Bilirubin Urine: NEGATIVE
Glucose, UA: NEGATIVE mg/dL
Hgb urine dipstick: NEGATIVE
Ketones, ur: NEGATIVE mg/dL
Leukocytes,Ua: NEGATIVE
Nitrite: POSITIVE — AB
Protein, ur: NEGATIVE mg/dL
Specific Gravity, Urine: 1.019 (ref 1.005–1.030)
pH: 6 (ref 5.0–8.0)

## 2019-08-24 LAB — WET PREP, GENITAL
Clue Cells Wet Prep HPF POC: NONE SEEN
Sperm: NONE SEEN
Trich, Wet Prep: NONE SEEN
Yeast Wet Prep HPF POC: NONE SEEN

## 2019-08-24 NOTE — MAU Provider Note (Signed)
History     [id number]  Arrival date and time: 08/24/19 2157    Chief Complaint  Patient presents with  . Back Pain  . Pelvic Pain     HPI Tracy [name] is a 34 y.o. at [redacted]w[redacted]d by 7wk [place], who presents for multiple complaints.   Reports lower abdominal pain, lower back pain, and upper back pain Started earlier this afternoon Reports something similar occurring about a month ago - was diagnosed with yeast at that time, workup otherwise unremarkable Also reports feeling of pressure Feels like her underwear is wet when she goes to the bathroom, but denies any significant gush of fluid Denies burning or pain with urination No vaginal discharge Denies constipation, diarrhea Denies blood in urine or stool Denies VB +FM but not much Denies contractions     OB History    Gravida  6   Para  2   Term  2   Preterm  0   AB  3   Living  2     SAB  3   TAB  0   Ectopic  0   Multiple  0   Live Births  2           Past Medical History:  Diagnosis Date  . Depression   . Diabetes mellitus without complication (HCC)    diet controlled  . Gestational diabetes   . Teeth problem    cracked molars, pt unsure if just right side or both sides    Past Surgical History:  Procedure Laterality Date  . NO PAST SURGERIES      History reviewed. No pertinent family history.  Social History   Socioeconomic History  . Marital status: Single    Spouse name: Not on file  . Number of children: Not on file  . Years of education: Not on file  . Highest education level: Not on file  Occupational History  . Not on file  Tobacco Use  . Smoking status: Former Smoker    Types: Cigarettes  . Smokeless tobacco: Never Used  Substance and Sexual Activity  . Alcohol use: No  . Drug use: No  . Sexual activity: Yes    Birth control/protection: None  Other Topics Concern  . Not on file  Social History Narrative  . Not on file   Social Determinants of Health    Financial Resource Strain:   . Difficulty of Paying Living Expenses: Not on file  Food Insecurity:   . Worried About Charity fundraiser in the Last Year: Not on file  . Ran Out of Food in the Last Year: Not on file  Transportation Needs:   . Lack of Transportation (Medical): Not on file  . Lack of Transportation (Non-Medical): Not on file  Physical Activity:   . Days of Exercise per Week: Not on file  . Minutes of Exercise per Session: Not on file  Stress:   . Feeling of Stress : Not on file  Social Connections:   . Frequency of Communication with Friends and Family: Not on file  . Frequency of Social Gatherings with Friends and Family: Not on file  . Attends Religious Services: Not on file  . Active Member of Clubs or Organizations: Not on file  . Attends Archivist Meetings: Not on file  . Marital Status: Not on file  Intimate Partner Violence:   . Fear of Current or Ex-Partner: Not on file  . Emotionally Abused: Not on file  .  Physically Abused: Not on file  . Sexually Abused: Not on file    No Known Allergies  No current facility-administered medications on file prior to encounter.   Current Outpatient Medications on File Prior to Encounter  Medication Sig Dispense Refill  . Prenatal Vit-Fe Phos-FA-Omega (VITAFOL GUMMIES) 3.33-0.333-34.8 MG CHEW Chew 1 each by mouth 3 (three) times daily.    Marland Kitchen terconazole (TERAZOL 7) 0.4 % vaginal cream Place 1 applicator vaginally at bedtime. 45 g 0     ROS Complete ROS completed and otherwise negative except as noted in HPI  Physical Exam   BP 115/69   Pulse 89   Temp 98.3 F (36.8 C) (Oral)   Resp 18   Wt 70.8 kg   LMP 05/14/2019   BMI 31.53 kg/m   Physical Exam  Vitals reviewed. Constitutional: She appears well-developed and well-nourished. No distress.  Eyes: No scleral icterus.  Respiratory: Effort normal. No respiratory distress.  GI: Soft. She exhibits no distension. There is abdominal tenderness.  There is no rebound and no guarding.  Mild suprapubic tenderness  Genitourinary:    Genitourinary Comments: Vaginal mucosa normal, no discharge, normal cervix appears closed and long. Bimaunal w closed cervix, no CMT or adnexal enlargment/tenderness   Musculoskeletal:        General: No edema.     Comments: Mild thoracic and lumbar paraspinal tenderness to palpation  Neurological: She is alert. Coordination normal.  Skin: Skin is warm and dry. She is not diaphoretic.  Psychiatric: She has a normal mood and affect.    Bedside Ultrasound Not done  FHT Doppler 154  Labs Results for orders placed or performed during the hospital encounter of 08/24/19 (from the past 24 hour(s))  Urinalysis, Routine w reflex microscopic     Status: Abnormal   Collection Time: 08/24/19 10:00 PM  Result Value Ref Range   Color, Urine YELLOW YELLOW   APPearance HAZY (A) CLEAR   Specific Gravity, Urine 1.019 1.005 - 1.030   pH 6.0 5.0 - 8.0   Glucose, UA NEGATIVE NEGATIVE mg/dL   Hgb urine dipstick NEGATIVE NEGATIVE   Bilirubin Urine NEGATIVE NEGATIVE   Ketones, ur NEGATIVE NEGATIVE mg/dL   Protein, ur NEGATIVE NEGATIVE mg/dL   Nitrite POSITIVE (A) NEGATIVE   Leukocytes,Ua NEGATIVE NEGATIVE   RBC / HPF 0-5 0 - 5 RBC/hpf   WBC, UA 11-20 0 - 5 WBC/hpf   Bacteria, UA MANY (A) NONE SEEN   Squamous Epithelial / LPF 0-5 0 - 5   Mucus PRESENT   OB Urine Culture     Status: None (Preliminary result)   Collection Time: 08/24/19 11:03 PM   Specimen: Urine  Result Value Ref Range   Specimen Description OB CLEAN CATCH    Special Requests      NONE Performed at [facility], [address], [place], [place] [address]    Culture PENDING    Report Status PENDING   Wet prep, genital     Status: Abnormal   Collection Time: 08/24/19 11:04 PM  Result Value Ref Range   Yeast Wet Prep HPF POC NONE SEEN NONE SEEN   Trich, Wet Prep NONE SEEN NONE SEEN   Clue Cells Wet Prep HPF POC NONE SEEN NONE SEEN    WBC, Wet Prep HPF POC MANY (A) NONE SEEN   Sperm NONE SEEN      MAU Course  Procedures UA OB UCx Wet Prep GC/CT  MDM Moderate  Assessment and Plan  #Pelvic  pain Likely secondary to progression of pregnancy. UA does show +nitrite and also has some mild suprapubic tenderness but given absence of any other findings on UA and patient report of similar symptoms will defer empiric treatment and await UCx. Wet prep unremarkable and speculum exam not suspicious for infection, GC/CT swab still pending.   #Back pain Presentation c/w muscle spasm Heat/ice, stretches, tylenol PRN  D/c to home w return precautions.   Venora Maples

## 2019-08-24 NOTE — MAU Note (Addendum)
Patient reports pain and pressure in lower abdomen and lower back.  Denies vaginal bleeding, Patient states when I go to the bathroom, my underwear is wet. She states she is unsure if it is urine or if her water is broke.    Patient reports DFM. She reports that she has felt movement already, but hasn't felt movement since Thursday.

## 2019-08-25 LAB — GC/CHLAMYDIA PROBE AMP (~~LOC~~) NOT AT ARMC
Chlamydia: NEGATIVE
Comment: NEGATIVE
Comment: NORMAL
Neisseria Gonorrhea: NEGATIVE

## 2019-08-25 NOTE — Discharge Instructions (Signed)
Abdominal Pain During Pregnancy  Belly (abdominal) pain is common during pregnancy. There are many possible causes. Most of the time, it is not a serious problem. Other times, it can be a sign that something is wrong with the pregnancy. Always tell your doctor if you have belly pain. Follow these instructions at home:  Do not have sex or put anything in your vagina until your pain goes away completely.  Get plenty of rest until your pain gets better.  Drink enough fluid to keep your pee (urine) pale yellow.  Take over-the-counter and prescription medicines only as told by your doctor.  Keep all follow-up visits as told by your doctor. This is important. Contact a doctor if:  Your pain continues or gets worse after resting.  You have lower belly pain that: ? Comes and goes at regular times. ? Spreads to your back. ? Feels like menstrual cramps.  You have pain or burning when you pee (urinate). Get help right away if:  You have a fever or chills.  You have vaginal bleeding.  You are leaking fluid from your vagina.  You are passing tissue from your vagina.  You throw up (vomit) for more than 24 hours.  You have watery poop (diarrhea) for more than 24 hours.  Your baby is moving less than usual.  You feel very weak or faint.  You have shortness of breath.  You have very bad pain in your upper belly. Summary  Belly (abdominal) pain is common during pregnancy. There are many possible causes.  If you have belly pain during pregnancy, tell your doctor right away.  Keep all follow-up visits as told by your doctor. This is important. This information is not intended to replace advice given to you by your health care provider. Make sure you discuss any questions you have with your health care provider. Document Revised: 11/04/2018 Document Reviewed: 10/19/2016 Elsevier Patient Education  2020 Elsevier Inc.  

## 2019-08-26 ENCOUNTER — Other Ambulatory Visit: Payer: Self-pay | Admitting: Family Medicine

## 2019-08-26 DIAGNOSIS — R8271 Bacteriuria: Secondary | ICD-10-CM

## 2019-08-26 MED ORDER — AMOXICILLIN-POT CLAVULANATE 875-125 MG PO TABS: 1.0000 | ORAL_TABLET | Freq: Two times a day (BID) | ORAL | 0 refills | Status: DC

## 2019-08-26 NOTE — Progress Notes (Addendum)
Rx for GBS bacteriuria and enterobacter UTI

## 2019-08-27 LAB — CULTURE, OB URINE: Culture: >=100000 — AB

## 2019-08-27 MED ORDER — SULFAMETHOXAZOLE-TRIMETHOPRIM 800-160 MG PO TABS: 1.0000 | ORAL_TABLET | Freq: Two times a day (BID) | ORAL | 0 refills | Status: DC

## 2019-08-27 NOTE — Addendum Note (Signed)
Addended by: Merian Capron on: 08/27/2019 09:19 PM   Modules accepted: Orders

## 2019-08-28 ENCOUNTER — Telehealth: Payer: Self-pay

## 2019-08-28 NOTE — Telephone Encounter (Signed)
The patient has been contacted on 08/27/2019 and 08/28/2019 in order to schedule an appointment.  We have been unable to speak with the patient or leave a voicemail because the phone rings a fast busy signal.  We will attempt to contact the patient again

## 2019-09-11 ENCOUNTER — Telehealth: Payer: Self-pay | Admitting: *Deleted

## 2019-09-11 NOTE — Telephone Encounter (Signed)
Received MyChart notification patient has not read MyChart message from Dr. Crissie Reese 08/27/19 re: GBS and need for second antibiotic. I called Tracy [name] and left  Message I was calling to let her know we wanted to let her know her provider had sent her a MyChart message with important information and we can see that you have not read it- please read your message and if you cannot read it- call our office or the new office you will be attending.  Tracy Licea,RN

## 2019-09-12 ENCOUNTER — Other Ambulatory Visit (HOSPITAL_COMMUNITY)
Admission: RE | Admit: 2019-09-12 | Discharge: 2019-09-12 | Disposition: A | Discharge status: Home / Self Care | Payer: Medicaid Other | Source: Ambulatory Visit | Attending: Obstetrics | Admitting: Obstetrics

## 2019-09-12 ENCOUNTER — Encounter: Payer: Self-pay | Admitting: Obstetrics

## 2019-09-12 ENCOUNTER — Ambulatory Visit (INDEPENDENT_AMBULATORY_CARE_PROVIDER_SITE_OTHER): Payer: Medicaid Other | Admitting: Obstetrics

## 2019-09-12 ENCOUNTER — Other Ambulatory Visit: Payer: Self-pay

## 2019-09-12 VITALS — BP 99/71 | HR 95 | Wt 156.2 lb

## 2019-09-12 DIAGNOSIS — O402XX0 Polyhydramnios, second trimester, not applicable or unspecified: Secondary | ICD-10-CM

## 2019-09-12 DIAGNOSIS — R8271 Bacteriuria: Secondary | ICD-10-CM

## 2019-09-12 DIAGNOSIS — O0990 Supervision of high risk pregnancy, unspecified, unspecified trimester: Secondary | ICD-10-CM

## 2019-09-12 DIAGNOSIS — Z3A20 20 weeks gestation of pregnancy: Secondary | ICD-10-CM

## 2019-09-12 DIAGNOSIS — O09299 Supervision of pregnancy with other poor reproductive or obstetric history, unspecified trimester: Secondary | ICD-10-CM

## 2019-09-12 DIAGNOSIS — N390 Urinary tract infection, site not specified: Secondary | ICD-10-CM

## 2019-09-12 DIAGNOSIS — O09292 Supervision of pregnancy with other poor reproductive or obstetric history, second trimester: Secondary | ICD-10-CM

## 2019-09-12 DIAGNOSIS — Z8632 Personal history of gestational diabetes: Secondary | ICD-10-CM

## 2019-09-12 DIAGNOSIS — O0992 Supervision of high risk pregnancy, unspecified, second trimester: Secondary | ICD-10-CM | POA: Diagnosis not present

## 2019-09-12 DIAGNOSIS — Z3480 Encounter for supervision of other normal pregnancy, unspecified trimester: Secondary | ICD-10-CM | POA: Insufficient documentation

## 2019-09-12 MED ORDER — VITAFOL GUMMIES 3.33-0.333-34.8 MG PO CHEW: 1.0000 | CHEWABLE_TABLET | Freq: Three times a day (TID) | ORAL | 11 refills | Status: DC

## 2019-09-12 MED ORDER — ASPIRIN 81 MG PO CHEW: 81.0000 mg | CHEWABLE_TABLET | Freq: Every day | ORAL | 5 refills | Status: DC

## 2019-09-12 MED ORDER — AMOXICILLIN-POT CLAVULANATE 875-125 MG PO TABS: 1.0000 | ORAL_TABLET | Freq: Two times a day (BID) | ORAL | 0 refills | Status: AC

## 2019-09-12 MED ORDER — SULFAMETHOXAZOLE-TRIMETHOPRIM 800-160 MG PO TABS: 1.0000 | ORAL_TABLET | Freq: Two times a day (BID) | ORAL | 0 refills | Status: AC

## 2019-09-12 MED ORDER — BLOOD PRESSURE CUFF MISC: 1.0000 | 0 refills | Status: DC

## 2019-09-12 NOTE — Progress Notes (Signed)
Pt is here for initial OB visit. [place] on 06/09/19 confirmed EDD 01/25/20.

## 2019-09-12 NOTE — Progress Notes (Signed)
Subjective:    Tracy [name] is being seen today for her first obstetrical visit.  This is a planned pregnancy. She is at [redacted]w[redacted]d gestation. Her obstetrical history is significant for intrauterine growth restriction (IUGR) and gestational diabetes. Relationship with FOB: significant other, living together. Patient does intend to breast feed. Pregnancy history fully reviewed.  The information documented in the HPI was reviewed and verified.  Menstrual History: OB History    Gravida  6   Para  2   Term  2   Preterm  0   AB  3   Living  2     SAB  3   TAB  0   Ectopic  0   Multiple  0   Live Births  2           Patient's last menstrual period was 05/14/2019.    Past Medical History:  Diagnosis Date  . Depression   . Diabetes mellitus without complication (HCC)    diet controlled  . Gestational diabetes   . Teeth problem    cracked molars, pt unsure if just right side or both sides    Past Surgical History:  Procedure Laterality Date  . NO PAST SURGERIES      (Not in a hospital admission)  No Known Allergies  Social History   Tobacco Use  . Smoking status: Former Smoker    Types: Cigarettes  . Smokeless tobacco: Never Used  Substance Use Topics  . Alcohol use: No    History reviewed. No pertinent family history.   Review of Systems Constitutional: negative for weight loss Gastrointestinal: negative for vomiting Genitourinary:negative for genital lesions and vaginal discharge and dysuria Musculoskeletal:negative for back pain Behavioral/Psych: negative for abusive relationship, depression, illegal drug usage and tobacco use    Objective:    BP 99/71   Pulse 95   Wt 156 lb 3.2 oz (70.9 kg)   LMP 05/14/2019   BMI 31.55 kg/m  General Appearance:    Alert, cooperative, no distress, appears stated age  Head:    Normocephalic, without obvious abnormality, atraumatic  Eyes:    PERRL, conjunctiva/corneas clear, EOM's intact, fundi   benign, both eyes  Ears:    Normal TM's and external ear canals, both ears  Nose:   Nares normal, septum midline, mucosa normal, no drainage    or sinus tenderness  Throat:   Lips, mucosa, and tongue normal; teeth and gums normal  Neck:   Supple, symmetrical, trachea midline, no adenopathy;    thyroid:  no enlargement/tenderness/nodules; no carotid   bruit or JVD  Back:     Symmetric, no curvature, ROM normal, no CVA tenderness  Lungs:     Clear to auscultation bilaterally, respirations unlabored  Chest Wall:    No tenderness or deformity   Heart:    Regular rate and rhythm, S1 and S2 normal, no murmur, rub   or gallop  Breast Exam:    No tenderness, masses, or nipple abnormality  Abdomen:     Soft, non-tender, bowel sounds active all four quadrants,    no masses, no organomegaly  Genitalia:    Normal female without lesion, discharge or tenderness  Extremities:   Extremities normal, atraumatic, no cyanosis or edema  Pulses:   2+ and symmetric all extremities  Skin:   Skin color, texture, turgor normal, no rashes or lesions  Lymph nodes:   Cervical, supraclavicular, and axillary nodes normal  Neurologic:   CNII-XII intact, normal strength, sensation and  reflexes    throughout      Lab Review Urine pregnancy test Labs reviewed yes Radiologic studies reviewed no  Assessment:    Pregnancy at [redacted]w[redacted]d weeks    Plan:     1. Supervision of high risk pregnancy, antepartum Rx: - Obstetric Panel, Including HIV - Culture, OB Urine - Genetic Screening - [place] MFM OB COMP + 14 WK; Future - Cytology - PAP( [place]) - Enroll Patient in Babyscripts - Prenatal Vit-Fe Phos-FA-Omega (VITAFOL GUMMIES) 3.33-0.333-34.8 MG CHEW; Chew 1 each by mouth 3 (three) times daily.  Dispense: 90 tablet; Refill: 11 - HgB A1c  2. History of IUGR (intrauterine growth retardation) and stillbirth, currently pregnant Rx: - aspirin 81 MG chewable tablet; Chew 1 tablet (81 mg total) by mouth daily.  Dispense: 30  tablet; Refill: 5  3. History of gestational diabetes mellitus (GDM) - ? Undiagnosed Type 2 Diabetic  4. Group B streptococcal bacteriuria Rx: - amoxicillin-clavulanate (AUGMENTIN) 875-125 MG tablet; Take 1 tablet by mouth 2 (two) times daily for 7 days.  Dispense: 14 tablet; Refill: 0  5. Urinary tract infection without hematuria, site unspecified Rx: - sulfamethoxazole-trimethoprim (BACTRIM DS) 800-160 MG tablet; Take 1 tablet by mouth 2 (two) times daily for 7 days.  Dispense: 14 tablet; Refill: 0   Prenatal vitamins.  Counseling provided regarding continued use of seat belts, cessation of alcohol consumption, smoking or use of illicit drugs; infection precautions i.e., influenza/TDAP immunizations, toxoplasmosis,CMV, parvovirus, listeria and varicella; workplace safety, exercise during pregnancy; routine dental care, safe medications, sexual activity, hot tubs, saunas, pools, travel, caffeine use, fish and methlymercury, potential toxins, hair treatments, varicose veins Weight gain recommendations per IOM guidelines reviewed: underweight/BMI< 18.5--> gain 28 - 40 lbs; normal weight/BMI 18.5 - 24.9--> gain 25 - 35 lbs; overweight/BMI 25 - 29.9--> gain 15 - 25 lbs; obese/BMI >30->gain  11 - 20 lbs Problem list reviewed and updated. FIRST/CF mutation testing/NIPT/QUAD SCREEN/fragile X/Ashkenazi Jewish population testing/Spinal muscular atrophy discussed: requested. Role of ultrasound in pregnancy discussed; fetal survey: requested. Amniocentesis discussed: not indicated.  Meds ordered this encounter  Medications  . Blood Pressure Monitoring (BLOOD PRESSURE CUFF) MISC    Sig: 1 Device by Does not apply route once a week.    Dispense:  1 each    Refill:  0  . sulfamethoxazole-trimethoprim (BACTRIM DS) 800-160 MG tablet    Sig: Take 1 tablet by mouth 2 (two) times daily for 7 days.    Dispense:  14 tablet    Refill:  0  . amoxicillin-clavulanate (AUGMENTIN) 875-125 MG tablet    Sig:  Take 1 tablet by mouth 2 (two) times daily for 7 days.    Dispense:  14 tablet    Refill:  0  . Prenatal Vit-Fe Phos-FA-Omega (VITAFOL GUMMIES) 3.33-0.333-34.8 MG CHEW    Sig: Chew 1 each by mouth 3 (three) times daily.    Dispense:  90 tablet    Refill:  11   Orders Placed This Encounter  Procedures  . Culture, OB Urine  . [place] MFM OB COMP + 14 WK    Standing Status:   Future    Standing Expiration Date:   11/09/2020    Order Specific Question:   Reason for Exam (SYMPTOM  OR DIAGNOSIS REQUIRED)    Answer:   anatomy scan    Order Specific Question:   Preferred Location    Answer:   [facility]  . Obstetric Panel, Including HIV  .  Genetic Screening    [facility]  . HgB A1c    Follow up in 4 weeks. 50% of 25 min visit spent on counseling and coordination of care.    1. Supervision of high risk pregnancy, antepartum Rx: - Obstetric Panel, Including HIV - Culture, OB Urine - Genetic Screening - US MFM OB COMP + 14 WK; Future - Cytology - PAP( [place]) - Enroll Patient in Babyscripts - Prenatal Vit-Fe Phos-FA-Omega (VITAFOL GUMMIES) 3.33-0.333-34.8 MG CHEW; Chew 1 each by mouth 3 (three) times daily.  Dispense: 90 tablet; Refill: 11 - HgB A1c  2. Group B streptococcal bacteriuria Rx: - amoxicillin-clavulanate (AUGMENTIN) 875-125 MG tablet; Take 1 tablet by mouth 2 (two) times daily for 7 days.  Dispense: 14 tablet; Refill: 0  3. Acute cystitis without hematuria Rx: - sulfamethoxazole-trimethoprim (BACTRIM DS) 800-160 MG tablet; Take 1 tablet by mouth 2 (two) times daily for 7 days.  Dispense: 14 tablet; Refill: 0  Brock Bad, MD 09/12/2019 10:41 AM

## 2019-09-13 LAB — OBSTETRIC PANEL, INCLUDING HIV
Antibody Screen: NEGATIVE
Basophils Absolute: 0 10*3/uL (ref 0.0–0.2)
Basos: 0 %
EOS (ABSOLUTE): 0.1 10*3/uL (ref 0.0–0.4)
Eos: 1 %
HIV Screen 4th Generation wRfx: NONREACTIVE
Hematocrit: 33.7 % — ABNORMAL LOW (ref 34.0–46.6)
Hemoglobin: 11.2 g/dL (ref 11.1–15.9)
Hepatitis B Surface Ag: NEGATIVE
Immature Grans (Abs): 0.1 10*3/uL (ref 0.0–0.1)
Immature Granulocytes: 1 %
Lymphocytes Absolute: 1.5 10*3/uL (ref 0.7–3.1)
Lymphs: 16 %
MCH: 26.9 pg (ref 26.6–33.0)
MCHC: 33.2 g/dL (ref 31.5–35.7)
MCV: 81 fL (ref 79–97)
Monocytes Absolute: 0.6 10*3/uL (ref 0.1–0.9)
Monocytes: 7 %
Neutrophils Absolute: 7.2 10*3/uL — ABNORMAL HIGH (ref 1.4–7.0)
Neutrophils: 75 %
Platelets: 315 10*3/uL (ref 150–450)
RBC: 4.17 x10E6/uL (ref 3.77–5.28)
RDW: 13.9 % (ref 11.7–15.4)
RPR Ser Ql: NONREACTIVE
Rh Factor: POSITIVE
Rubella Antibodies, IGG: 1.34 index (ref >0.99)
WBC: 9.5 10*3/uL (ref 3.4–10.8)

## 2019-09-13 LAB — HEMOGLOBIN A1C
Est. average glucose Bld gHb Est-mCnc: 108 mg/dL
Hgb A1c MFr Bld: 5.4 % (ref 4.8–5.6)

## 2019-09-15 LAB — URINE CULTURE, OB REFLEX

## 2019-09-15 LAB — CULTURE, OB URINE

## 2019-09-16 ENCOUNTER — Telehealth: Payer: Self-pay

## 2019-09-16 LAB — CYTOLOGY - PAP
Comment: NEGATIVE
Diagnosis: REACTIVE
High risk HPV: NEGATIVE

## 2019-09-16 NOTE — Telephone Encounter (Signed)
Pt calling regarding results please advise.

## 2019-09-26 ENCOUNTER — Ambulatory Visit (HOSPITAL_COMMUNITY)
Admission: RE | Admit: 2019-09-26 | Discharge: 2019-09-26 | Disposition: A | Discharge status: Home / Self Care | Payer: Medicaid Other | Source: Ambulatory Visit | Attending: Obstetrics | Admitting: Obstetrics

## 2019-09-26 ENCOUNTER — Other Ambulatory Visit: Payer: Self-pay | Admitting: Obstetrics

## 2019-09-26 ENCOUNTER — Other Ambulatory Visit (HOSPITAL_COMMUNITY): Payer: Self-pay | Admitting: *Deleted

## 2019-09-26 ENCOUNTER — Other Ambulatory Visit: Payer: Self-pay

## 2019-09-26 DIAGNOSIS — Z3A22 22 weeks gestation of pregnancy: Secondary | ICD-10-CM | POA: Diagnosis not present

## 2019-09-26 DIAGNOSIS — O402XX0 Polyhydramnios, second trimester, not applicable or unspecified: Secondary | ICD-10-CM | POA: Diagnosis not present

## 2019-09-26 DIAGNOSIS — O0990 Supervision of high risk pregnancy, unspecified, unspecified trimester: Secondary | ICD-10-CM | POA: Diagnosis present

## 2019-09-26 DIAGNOSIS — Z362 Encounter for other antenatal screening follow-up: Secondary | ICD-10-CM

## 2019-09-26 DIAGNOSIS — O09292 Supervision of pregnancy with other poor reproductive or obstetric history, second trimester: Secondary | ICD-10-CM

## 2019-09-29 ENCOUNTER — Encounter: Payer: Self-pay | Admitting: Obstetrics

## 2019-10-06 ENCOUNTER — Encounter: Payer: Self-pay | Admitting: Obstetrics

## 2019-10-09 DIAGNOSIS — O409XX0 Polyhydramnios, unspecified trimester, not applicable or unspecified: Secondary | ICD-10-CM | POA: Insufficient documentation

## 2019-10-10 ENCOUNTER — Telehealth: Payer: Medicaid Other | Admitting: Women's Health

## 2019-10-15 ENCOUNTER — Other Ambulatory Visit: Payer: Self-pay

## 2019-10-15 ENCOUNTER — Encounter: Payer: Self-pay | Admitting: Women's Health

## 2019-10-15 ENCOUNTER — Ambulatory Visit (INDEPENDENT_AMBULATORY_CARE_PROVIDER_SITE_OTHER): Payer: Medicaid Other | Admitting: Women's Health

## 2019-10-15 ENCOUNTER — Other Ambulatory Visit: Payer: Medicaid Other

## 2019-10-15 VITALS — BP 122/78 | HR 72 | Wt 158.0 lb

## 2019-10-15 DIAGNOSIS — R8271 Bacteriuria: Secondary | ICD-10-CM | POA: Diagnosis not present

## 2019-10-15 DIAGNOSIS — O2392 Unspecified genitourinary tract infection in pregnancy, second trimester: Secondary | ICD-10-CM | POA: Diagnosis not present

## 2019-10-15 DIAGNOSIS — O98812 Other maternal infectious and parasitic diseases complicating pregnancy, second trimester: Secondary | ICD-10-CM

## 2019-10-15 DIAGNOSIS — O3662X0 Maternal care for excessive fetal growth, second trimester, not applicable or unspecified: Secondary | ICD-10-CM

## 2019-10-15 DIAGNOSIS — O402XX0 Polyhydramnios, second trimester, not applicable or unspecified: Secondary | ICD-10-CM | POA: Diagnosis not present

## 2019-10-15 DIAGNOSIS — Z8632 Personal history of gestational diabetes: Secondary | ICD-10-CM

## 2019-10-15 DIAGNOSIS — O3660X0 Maternal care for excessive fetal growth, unspecified trimester, not applicable or unspecified: Secondary | ICD-10-CM | POA: Insufficient documentation

## 2019-10-15 DIAGNOSIS — Z23 Encounter for immunization: Secondary | ICD-10-CM

## 2019-10-15 DIAGNOSIS — Z3480 Encounter for supervision of other normal pregnancy, unspecified trimester: Secondary | ICD-10-CM

## 2019-10-15 DIAGNOSIS — Z3A25 25 weeks gestation of pregnancy: Secondary | ICD-10-CM

## 2019-10-15 DIAGNOSIS — N3000 Acute cystitis without hematuria: Secondary | ICD-10-CM

## 2019-10-15 NOTE — Addendum Note (Signed)
Addended by: Donia Ast E on: 10/15/2019 11:02 AM   Modules accepted: Orders

## 2019-10-15 NOTE — Addendum Note (Signed)
Addended by: Cheree Ditto, Yasmin Dibello A on: 10/15/2019 11:27 AM   Modules accepted: Orders

## 2019-10-15 NOTE — Progress Notes (Addendum)
Subjective:  Tracy [name] is a 34 y.o. [id number] at [redacted]w[redacted]d being seen today for ongoing prenatal care.  She is currently monitored for the following issues for this low-risk pregnancy and has ALLERGIC RHINITIS; Urinary tract infection, site not specified; History of gestational diabetes; Group B streptococcal bacteriuria; Supervision of other normal pregnancy, antepartum; Polyhydramnios; and LGA (large for gestational age) fetus affecting management of mother on their problem list.  Patient reports no complaints.  Contractions: Not present. Vag. Bleeding: None.  Movement: Present. Denies leaking of fluid.   The following portions of the patient's history were reviewed and updated as appropriate: allergies, current medications, past family history, past medical history, past social history, past surgical history and problem list. Problem list updated.  Objective:   Vitals:   10/15/19 1030  BP: 122/78  Pulse: 72  Weight: 158 lb (71.7 kg)    Fetal Status: Fetal Heart Rate (bpm): 152 Fundal Height: 29 cm Movement: Present     General:  Alert, oriented and cooperative. Patient is in no acute distress.  Skin: Skin is warm and dry. No rash noted.   Cardiovascular: Normal heart rate noted  Respiratory: Normal respiratory effort, no problems with respiration noted  Abdomen: Soft, gravid, appropriate for gestational age. Pain/Pressure: Absent     Pelvic: Vag. Bleeding: None     Cervical exam deferred        Extremities: Normal range of motion.  Edema: None  Mental Status: Normal mood and affect. Normal behavior. Normal judgment and thought content.   Urinalysis:      Assessment and Plan:  Pregnancy: [id number] at [redacted]w[redacted]d  1. Supervision of other normal pregnancy, antepartum -pt accepts flu vaccine, information given -pt accepts TDap today -peds list given  2. Polyhydramnios in second trimester, single or unspecified fetus -with LGA>99% -has f/u growth scan scheduled 10/24/2019, pt  unaware, appt date and time given and patient recorded in calendar  3. Group B streptococcal bacteriuria -will treat in labor  4. History of gestational diabetes -early A1C 5.4 -GTT today -patient not taking ASA daily, encouraged to start  5. Acute cystitis without hematuria -GBS+ and E. [place]+ UCx 08/24/2019 -TOC 09/12/2019 - neg for GBS, +UTI other organism -patient reports she took ABX as prescribed -urine TOC today  Preterm labor symptoms and general obstetric precautions including but not limited to vaginal bleeding, contractions, leaking of fluid and fetal movement were reviewed in detail with the patient. I discussed the assessment and treatment plan with the patient. The patient was provided an opportunity to ask questions and all were answered. The patient agreed with the plan and demonstrated an understanding of the instructions. The patient was advised to call back or seek an in-person office evaluation/go to [facility] at [facility] for any urgent or concerning symptoms. Please refer to After Visit Summary for other counseling recommendations.  Return in about 3 weeks (around 11/05/2019) for in-person [facility].   Sheree Lalla, Odie Sera, NP

## 2019-10-15 NOTE — Patient Instructions (Addendum)
AREA PEDIATRIC/FAMILY PRACTICE PHYSICIANS  [facility] [address] [place], [place] [address] Phone - [phone]   Fax - [phone]  JACK AMOS [address] [place], [place]  [address] Phone - [phone]   Fax - [phone]  [facility] [address] [place], [place]  [address] Phone - [phone]   Fax - [phone]  [facility] [address] [place], [place]  [address] Phone - [phone]   Fax - [phone]  [facility] [address] [place], [place]  [address] Phone - [phone]   Fax - [phone]  [facility] [address] [place], [place]  [address] Phone - [phone]   Fax - [phone]  [facility] [place] [address] [place], [place]  [address] Phone - [phone]   Fax - [phone]  [facility] AT [facility] [address] [place], [address] [place], [place]  [address] Phone - [phone]   Fax - [phone]  [facility] AT [facility] [address] [place], [place]  [address] Phone - [phone]   Fax - [phone] [facility] [place] [address] [place], [place]  [address] Phone - [phone]   Fax - [phone]  [facility] [address] [place], [place]  [address] Phone - [phone]   Fax - [phone]  [facility] [address] [place], [place]  [address] Phone - [phone]   Fax - [phone]  [facility] [place] [address] [place], [place]  [address] Phone - [phone]   Fax - [phone]  [facility] [address] [place], [place]  [address] Phone - [phone]  [facility] [address] [place], [place]  [address] Phone - [phone]   Fax - [phone]  [facility] [address] [place], [place]  [address] Phone - [phone]   Fax - [phone]  [facility] [address] [place], [place]  [address] Phone - [phone]   Fax - [phone]  [facility] [address] [place], [place]  [address] Phone - [phone]   Fax - [phone]   [facility] [address] [place], [place]  [address] Phone - [phone]   Fax - [phone]  [facility] [address] [place], [place]  [address] Phone - [phone]   Fax - [phone]  DAVID RUBIN [address] [place], [place]  [address] Phone - [phone]   Fax - [phone]  [facility] [address] [place], [place]  [address] Phone - [phone]   Fax - [phone]  [place] [facility] Alita Chyle [address] [place], [place]  [address] Phone - [phone]   Fax - [phone] Gerarda Fraction [address] [place], [place]  [address] Phone - [phone]   Fax - [phone]  [facility] [address] [place], [place]  [address] Phone - [phone]   Fax - [phone]  [facility] - [place] [address] [place], [place]  [address] Phone - [phone]   Fax - [phone]    Preterm Labor and Birth Information  The normal length of a pregnancy is 39-41 weeks. Preterm labor is when labor starts before 37 completed weeks of pregnancy. What are the risk factors  for preterm labor? Preterm labor is more likely to occur in women who:  Have certain infections during pregnancy such as a bladder infection, sexually transmitted infection, or infection inside the uterus (chorioamnionitis).  Have a shorter-than-normal cervix.  Have gone into preterm labor before.  Have had surgery on their cervix.  Are younger than age 10 or older than age 69.  Are African American.  Are pregnant with twins or  multiple babies (multiple gestation).  Take street drugs or smoke while pregnant.  Do not gain enough weight while pregnant.  Became pregnant shortly after having been pregnant. What are the symptoms of preterm labor? Symptoms of preterm labor include:  Cramps similar to those that can happen during a menstrual period. The cramps may happen with diarrhea.  Pain in the abdomen or lower back.  Regular uterine contractions that may feel like tightening of the abdomen.  A feeling of increased pressure in the pelvis.  Increased watery or bloody mucus discharge from the vagina.  Water breaking (ruptured amniotic sac). Why is it important to recognize signs of preterm labor? It is important to recognize signs of preterm labor because babies who are born prematurely may not be fully developed. This can put them at an increased risk for:  Long-term (chronic) heart and lung problems.  Difficulty immediately after birth with regulating body systems, including blood sugar, body temperature, heart rate, and breathing rate.  Bleeding in the brain.  Cerebral palsy.  Learning difficulties.  Death. These risks are highest for babies who are born before 34 weeks of pregnancy. How is preterm labor treated? Treatment depends on the length of your pregnancy, your condition, and the health of your baby. It may involve:  Having a stitch (suture) placed in your cervix to prevent your cervix from opening too early (cerclage).  Taking or being given medicines, such as: ? Hormone medicines. These may be given early in pregnancy to help support the pregnancy. ? Medicine to stop contractions. ? Medicines to help mature the baby's lungs. These may be prescribed if the risk of delivery is high. ? Medicines to prevent your baby from developing cerebral palsy. If the labor happens before 34 weeks of pregnancy, you may need to stay in the hospital. What should I do if I think I am in preterm labor? If  you think that you are going into preterm labor, call your health care provider right away. How can I prevent preterm labor in future pregnancies? To increase your chance of having a full-term pregnancy:  Do not use any tobacco products, such as cigarettes, chewing tobacco, and e-cigarettes. If you need help quitting, ask your health care provider.  Do not use street drugs or medicines that have not been prescribed to you during your pregnancy.  Talk with your health care provider before taking any herbal supplements, even if you have been taking them regularly.  Make sure you gain a healthy amount of weight during your pregnancy.  Watch for infection. If you think that you might have an infection, get it checked right away.  Make sure to tell your health care provider if you have gone into preterm labor before. This information is not intended to replace advice given to you by your health care provider. Make sure you discuss any questions you have with your health care provider. Document Revised: 11/08/2018 Document Reviewed: 12/08/2015 Elsevier Patient Education  2020 Elsevier Inc.  Maternity Assessment Unit (MAU)  The Maternity Assessment Unit (MAU) is located at the  [facility] . The address is: [address], [place], [place] [address]. Please see map below for additional directions.    The Maternity Assessment Unit is designed to help you during your pregnancy, and for up to 6 weeks after delivery, with any pregnancy- or postpartum-related emergencies, if you think you are in labor, or if your water has broken. For example, if you experience nausea and vomiting, vaginal bleeding, severe abdominal or pelvic pain, elevated blood pressure or other problems related to your pregnancy or postpartum time, please come to the Maternity Assessment Unit for assistance.   https://www.cdc.gov/vaccines/hcp/vis/vis-statements/tdap.pdf">   Tdap (Tetanus, Diphtheria, Pertussis) Vaccine: What You Need to Know 1. Why get vaccinated? Tdap vaccine can prevent tetanus, diphtheria, and pertussis. Diphtheria and pertussis spread from person to person. Tetanus enters the body through cuts or wounds.  TETANUS (T) causes painful stiffening of the muscles. Tetanus can lead to serious health problems, including being unable to open the mouth, having trouble swallowing and breathing, or death.  DIPHTHERIA (D) can lead to difficulty breathing, heart failure, paralysis, or death.  PERTUSSIS (aP), also known as "whooping cough," can cause uncontrollable, violent coughing which makes it hard to breathe, eat, or drink. Pertussis can be extremely serious in babies and young children, causing pneumonia, convulsions, brain damage, or death. In teens and adults, it can cause weight loss, loss of bladder control, passing out, and rib fractures from severe coughing. 2. Tdap vaccine Tdap is only for children 7 years and older, adolescents, and adults.  Adolescents should receive a single dose of Tdap, preferably at age 34 or 12 years. Pregnant women should get a dose of Tdap during every pregnancy, to protect the newborn from pertussis. Infants are most at risk for severe, life-threatening complications from pertussis. Adults who have never received Tdap should get a dose of Tdap. Also, adults should receive a booster dose every 10 years, or earlier in the case of a severe and dirty wound or burn. Booster doses can be either Tdap or Td (a different vaccine that protects against tetanus and diphtheria but not pertussis). Tdap may be given at the same time as other vaccines. 3. Talk with your health care provider Tell your vaccine provider if the person getting the vaccine:  Has had an allergic reaction after a previous dose of any vaccine that protects against tetanus, diphtheria, or pertussis, or has any severe, life-threatening allergies.  Has had a  coma, decreased level of consciousness, or prolonged seizures within 7 days after a previous dose of any pertussis vaccine (DTP, DTaP, or Tdap).  Has seizures or another nervous system problem.  Has ever had Guillain-Barr Syndrome (also called GBS).  Has had severe pain or swelling after a previous dose of any vaccine that protects against tetanus or diphtheria. In some cases, your health care provider may decide to postpone Tdap vaccination to a future visit.  People with minor illnesses, such as a cold, may be vaccinated. People who are moderately or severely ill should usually wait until they recover before getting Tdap vaccine.  Your health care provider can give you more information. 4. Risks of a vaccine reaction  Pain, redness, or swelling where the shot was given, mild fever, headache, feeling tired, and nausea, vomiting, diarrhea, or stomachache sometimes happen after Tdap vaccine. People sometimes faint after medical procedures, including vaccination. Tell your provider if you feel dizzy or have vision changes or ringing in the ears.  As with  any medicine, there is a very remote chance of a vaccine causing a severe allergic reaction, other serious injury, or death. 5. What if there is a serious problem? An allergic reaction could occur after the vaccinated person leaves the clinic. If you see signs of a severe allergic reaction (hives, swelling of the face and throat, difficulty breathing, a fast heartbeat, dizziness, or weakness), call 9-1-1 and get the person to the nearest hospital. For other signs that concern you, call your health care provider.  Adverse reactions should be reported to the Vaccine Adverse Event Reporting System (VAERS). Your health care provider will usually file this report, or you can do it yourself. Visit the VAERS website at www.vaers.LAgents.no or call 234-315-9003. VAERS is only for reporting reactions, and VAERS staff do not give medical advice. 6. The  National Vaccine Injury Compensation Program The [employer] Vaccine Injury Compensation Program (VICP) is a federal program that was created to compensate people who may have been injured by certain vaccines. Visit the VICP website at SpiritualWord.at or call (774) 814-5140 to learn about the program and about filing a claim. There is a time limit to file a claim for compensation. 7. How can I learn more?  Ask your health care provider.  Call your local or state health department.  Contact the Centers for Disease Control and Prevention (CDC): ? Call 413-680-1573 (1-800-CDC-INFO) or ? Visit CDC's website at PicCapture.uy Vaccine Information Statement Tdap (Tetanus, Diphtheria, Pertussis) Vaccine (10/30/2018) This information is not intended to replace advice given to you by your health care provider. Make sure you discuss any questions you have with your health care provider. Document Revised: 11/08/2018 Document Reviewed: 11/11/2018 Elsevier Patient Education  2020 Elsevier Inc.  Pregnancy and Influenza  Influenza, also called the flu, is an infection of the lungs and airways (respiratory tract). If you are pregnant, you are more likely to catch the flu. You are also more likely to have a more serious case of the flu. This is because pregnancy causes changes to your body's disease-fighting system (immune system), heart, and lungs. If you develop a bad case of the flu, especially with a high fever, this can cause problems for you and your developing baby. How do people get the flu? The flu is caused by a type of germ called a virus. It spreads when virus particles get passed from person to person by:  Being near a sick person who is coughing or sneezing.  Touching something that has the virus on it and then touching your mouth, nose, or face. The influenza virus is most common during the fall and winter. How can I protect myself against the flu?  Get a flu shot. The  best way to prevent the flu is to get a flu shot before flu season starts. The flu shot is not dangerous for your developing baby. It may even help protect your baby from the flu for up to 6 months after birth.  Wash your hands often with soap and warm water. If soap and water are not available, use hand sanitizer.  Do not come in close contact with sick people.  Do not share food, drinks, or utensils with other people.  Avoid touching your eyes, nose, and mouth.  Clean frequently used surfaces at home, school, or work.  Practice healthy lifestyle habits, such as: ? Eating a healthy, balanced diet. ? Drinking plenty of fluids. ? Exercising regularly or as told by your health care provider. ? Sleeping 7-9 hours each night. ?  Finding ways to manage stress. What should I do if I have flu symptoms?  If you have any symptoms of the flu, even after getting a flu shot, contact your health care provider right away.  To reduce fever, take over-the-counter acetaminophen as told by your health care provider.  If you have the flu, you may get antiviral medicine to keep the flu from becoming severe and to shorten how long it lasts.  Avoid spreading the flu to others: ? Stay home until you are well. ? Cover your nose and mouth when you cough or sneeze. ? Wash your hands often. Follow these instructions at home:  Take over-the-counter and prescription medicines only as told by your health care provider. Do not take any medicine, including cold or flu medicine, unless your health care provider tells you to do so.  If you were prescribed antiviral medicine, take it as told by your health care provider. Do not stop taking the antiviral medicine even if you start to feel better.  Eat a nutrient-rich diet that includes fresh fruits and vegetables, whole grains, lean protein, and low-fat dairy.  Drink enough fluid to keep your urine clear or pale yellow.  Get plenty of rest. Contact a health care  provider if:  You have fever or chills.  You have a cough, sore throat, or stuffy nose.  You have worsening or unusual: ? Muscle aches. ? Headache. ? Tiredness. ? Loss of appetite.  You have vomiting or diarrhea. Get help right away if:  You have trouble breathing.  You have chest pain.  You have abdominal pain.  You begin to have labor pains.  You have a fever that does not go down 24 hours after you take medicine.  You do not feel your baby move.  You have diarrhea or vomiting that will not go away.  You have dizziness or confusion.  Your symptoms do not improve, even with treatment. Summary  If you are pregnant, you are more likely to catch the flu. You are also more likely to have a more serious case of the flu.  If you have flu-like symptoms, call your health care provider right away. If you develop a bad case of the flu, especially with a high fever, this can be dangerous for your developing baby.  The best way to prevent the flu is to get a flu shot before flu season starts. The flu shot is not dangerous for your developing baby.  If you have the flu and were prescribed antiviral medicine, take it as told by your health care provider. This information is not intended to replace advice given to you by your health care provider. Make sure you discuss any questions you have with your health care provider. Document Revised: 11/08/2018 Document Reviewed: 09/12/2016 Elsevier Patient Education  2020 Elsevier Inc.    Pregnancy and Urinary Tract Infection  A urinary tract infection (UTI) is an infection of any part of the urinary tract. This includes the kidneys, the tubes that connect your kidneys to your bladder (ureters), the bladder, and the tube that carries urine out of your body (urethra). These organs make, store, and get rid of urine in the body. Your health care provider may use other names to describe the infection. An upper UTI affects the ureters and  kidneys (pyelonephritis). A lower UTI affects the bladder (cystitis) and urethra (urethritis). Most urinary tract infections are caused by bacteria in your genital area, around the entrance to your urinary tract (urethra).  These bacteria grow and cause irritation and inflammation of your urinary tract. You are more likely to develop a UTI during pregnancy because the physical and hormonal changes your body goes through can make it easier for bacteria to get into your urinary tract. Your growing baby also puts pressure on your bladder and can affect urine flow. It is important to recognize and treat UTIs in pregnancy because of the risk of serious complications for both you and your baby. How does this affect me? Symptoms of a UTI include:  Needing to urinate right away (urgently).  Frequent urination or passing small amounts of urine frequently.  Pain or burning with urination.  Blood in the urine.  Urine that smells bad or unusual.  Trouble urinating.  Cloudy urine.  Pain in the abdomen or lower back.  Vaginal discharge. You may also have:  Vomiting or a decreased appetite.  Confusion.  Irritability or tiredness.  A fever.  Diarrhea. How does this affect my baby? An untreated UTI during pregnancy could lead to a kidney infection or a systemic infection, which can cause health problems that could affect your baby. Possible complications of an untreated UTI include:  Giving birth to your baby before 37 weeks of pregnancy (premature).  Having a baby with a low birth weight.  Developing high blood pressure during pregnancy (preeclampsia).  Having a low hemoglobin level (anemia). What can I do to lower my risk? To prevent a UTI:  Go to the bathroom as soon as you feel the need. Do not hold urine for long periods of time.  Always wipe from front to back, especially after a bowel movement. Use each tissue one time when you wipe.  Empty your bladder after sex.  Keep your  genital area dry.  Drink 6-10 glasses of water each day.  Do not douche or use deodorant sprays. How is this treated? Treatment for this condition may include:  Antibiotic medicines that are safe to take during pregnancy.  Other medicines to treat less common causes of UTI. Follow these instructions at home:  If you were prescribed an antibiotic medicine, take it as told by your health care provider. Do not stop using the antibiotic even if you start to feel better.  Keep all follow-up visits as told by your health care provider. This is important. Contact a health care provider if:  Your symptoms do not improve or they get worse.  You have abnormal vaginal discharge. Get help right away if you:  Have a fever.  Have nausea and vomiting.  Have back or side pain.  Feel contractions in your uterus.  Have lower belly pain.  Have a gush of fluid from your vagina.  Have blood in your urine. Summary  A urinary tract infection (UTI) is an infection of any part of the urinary tract, which includes the kidneys, ureters, bladder, and urethra.  Most urinary tract infections are caused by bacteria in your genital area, around the entrance to your urinary tract (urethra).  You are more likely to develop a UTI during pregnancy.  If you were prescribed an antibiotic medicine, take it as told by your health care provider. Do not stop using the antibiotic even if you start to feel better. This information is not intended to replace advice given to you by your health care provider. Make sure you discuss any questions you have with your health care provider. Document Revised: 11/08/2018 Document Reviewed: 06/20/2018 Elsevier Patient Education  [place].

## 2019-10-16 ENCOUNTER — Other Ambulatory Visit: Payer: Self-pay

## 2019-10-16 ENCOUNTER — Other Ambulatory Visit: Payer: Self-pay | Admitting: Women's Health

## 2019-10-16 ENCOUNTER — Encounter: Payer: Self-pay | Admitting: Women's Health

## 2019-10-16 DIAGNOSIS — O24419 Gestational diabetes mellitus in pregnancy, unspecified control: Secondary | ICD-10-CM

## 2019-10-16 DIAGNOSIS — O99012 Anemia complicating pregnancy, second trimester: Secondary | ICD-10-CM

## 2019-10-16 DIAGNOSIS — O99019 Anemia complicating pregnancy, unspecified trimester: Secondary | ICD-10-CM | POA: Insufficient documentation

## 2019-10-16 LAB — GLUCOSE TOLERANCE, 2 HOURS W/ 1HR
Glucose, 1 hour: 183 mg/dL — ABNORMAL HIGH (ref 65–179)
Glucose, 2 hour: 158 mg/dL — ABNORMAL HIGH (ref 65–152)
Glucose, Fasting: 85 mg/dL (ref 65–91)

## 2019-10-16 LAB — RPR: RPR Ser Ql: NONREACTIVE

## 2019-10-16 LAB — HIV ANTIBODY (ROUTINE TESTING W REFLEX): HIV Screen 4th Generation wRfx: NONREACTIVE

## 2019-10-16 LAB — CBC
Hematocrit: 31.4 % — ABNORMAL LOW (ref 34.0–46.6)
Hemoglobin: 10.3 g/dL — ABNORMAL LOW (ref 11.1–15.9)
MCH: 26.3 pg — ABNORMAL LOW (ref 26.6–33.0)
MCHC: 32.8 g/dL (ref 31.5–35.7)
MCV: 80 fL (ref 79–97)
Platelets: 337 10*3/uL (ref 150–450)
RBC: 3.91 x10E6/uL (ref 3.77–5.28)
RDW: 13.9 % (ref 11.7–15.4)
WBC: 9.1 10*3/uL (ref 3.4–10.8)

## 2019-10-16 MED ORDER — ACCU-CHEK SOFTCLIX LANCETS MISC: 1.0000 | Freq: Four times a day (QID) | 12 refills | Status: DC

## 2019-10-16 MED ORDER — ACCU-CHEK GUIDE W/DEVICE KIT: 1.0000 | PACK | Freq: Four times a day (QID) | 0 refills | Status: DC

## 2019-10-16 MED ORDER — FERROUS SULFATE 325 (65 FE) MG PO TABS: 325.0000 mg | ORAL_TABLET | Freq: Every day | ORAL | 3 refills | Status: DC

## 2019-10-16 MED ORDER — ACCU-CHEK GUIDE VI STRP: ORAL_STRIP | 12 refills | Status: DC

## 2019-10-16 NOTE — Progress Notes (Signed)
RX iron and referral to diabetes and nutrition services for GDM.  Marylen Ponto, NP  11:05 AM 10/16/2019

## 2019-10-17 LAB — URINE CULTURE

## 2019-10-24 ENCOUNTER — Other Ambulatory Visit: Payer: Self-pay

## 2019-10-24 ENCOUNTER — Ambulatory Visit (HOSPITAL_COMMUNITY): Payer: Medicaid Other | Admitting: *Deleted

## 2019-10-24 ENCOUNTER — Encounter (HOSPITAL_COMMUNITY): Payer: Self-pay

## 2019-10-24 ENCOUNTER — Other Ambulatory Visit (HOSPITAL_COMMUNITY): Payer: Self-pay | Admitting: *Deleted

## 2019-10-24 ENCOUNTER — Ambulatory Visit (HOSPITAL_COMMUNITY)
Admission: RE | Admit: 2019-10-24 | Discharge: 2019-10-24 | Disposition: A | Discharge status: Home / Self Care | Payer: Medicaid Other | Source: Ambulatory Visit | Attending: Obstetrics and Gynecology | Admitting: Obstetrics and Gynecology

## 2019-10-24 DIAGNOSIS — Z362 Encounter for other antenatal screening follow-up: Secondary | ICD-10-CM | POA: Insufficient documentation

## 2019-10-24 DIAGNOSIS — Z8632 Personal history of gestational diabetes: Secondary | ICD-10-CM

## 2019-10-24 DIAGNOSIS — O402XX0 Polyhydramnios, second trimester, not applicable or unspecified: Secondary | ICD-10-CM | POA: Diagnosis not present

## 2019-10-24 DIAGNOSIS — O403XX0 Polyhydramnios, third trimester, not applicable or unspecified: Secondary | ICD-10-CM

## 2019-10-24 DIAGNOSIS — Z3480 Encounter for supervision of other normal pregnancy, unspecified trimester: Secondary | ICD-10-CM | POA: Diagnosis present

## 2019-10-24 DIAGNOSIS — Z3A26 26 weeks gestation of pregnancy: Secondary | ICD-10-CM | POA: Diagnosis not present

## 2019-10-24 DIAGNOSIS — O24410 Gestational diabetes mellitus in pregnancy, diet controlled: Secondary | ICD-10-CM

## 2019-11-05 ENCOUNTER — Encounter: Payer: Self-pay | Admitting: Obstetrics and Gynecology

## 2019-11-05 ENCOUNTER — Ambulatory Visit (INDEPENDENT_AMBULATORY_CARE_PROVIDER_SITE_OTHER): Payer: Medicaid Other | Admitting: Obstetrics and Gynecology

## 2019-11-05 ENCOUNTER — Other Ambulatory Visit: Payer: Self-pay

## 2019-11-05 ENCOUNTER — Encounter: Payer: Medicaid Other | Attending: Women's Health

## 2019-11-05 VITALS — BP 109/72 | HR 105 | Wt 162.0 lb

## 2019-11-05 DIAGNOSIS — O99013 Anemia complicating pregnancy, third trimester: Secondary | ICD-10-CM

## 2019-11-05 DIAGNOSIS — O403XX0 Polyhydramnios, third trimester, not applicable or unspecified: Secondary | ICD-10-CM

## 2019-11-05 DIAGNOSIS — O98813 Other maternal infectious and parasitic diseases complicating pregnancy, third trimester: Secondary | ICD-10-CM

## 2019-11-05 DIAGNOSIS — Z3480 Encounter for supervision of other normal pregnancy, unspecified trimester: Secondary | ICD-10-CM

## 2019-11-05 DIAGNOSIS — Z3A28 28 weeks gestation of pregnancy: Secondary | ICD-10-CM

## 2019-11-05 DIAGNOSIS — R8271 Bacteriuria: Secondary | ICD-10-CM

## 2019-11-05 DIAGNOSIS — O24410 Gestational diabetes mellitus in pregnancy, diet controlled: Secondary | ICD-10-CM

## 2019-11-05 DIAGNOSIS — O3663X0 Maternal care for excessive fetal growth, third trimester, not applicable or unspecified: Secondary | ICD-10-CM

## 2019-11-05 LAB — POCT URINALYSIS DIPSTICK
Bilirubin, UA: NEGATIVE
Blood, UA: NEGATIVE
Glucose, UA: NEGATIVE
Nitrite, UA: NEGATIVE
Protein, UA: POSITIVE — AB
Spec Grav, UA: 1.02 (ref 1.010–1.025)
Urobilinogen, UA: 0.2 E.U./dL
pH, UA: 6 (ref 5.0–8.0)

## 2019-11-05 MED ORDER — TERCONAZOLE 0.8 % VA CREA: 1.0000 | TOPICAL_CREAM | Freq: Every day | VAGINAL | 0 refills | Status: DC

## 2019-11-05 MED ORDER — METFORMIN HCL 500 MG PO TABS: 500.0000 mg | ORAL_TABLET | Freq: Two times a day (BID) | ORAL | 5 refills | Status: DC

## 2019-11-05 NOTE — Progress Notes (Signed)
   PRENATAL VISIT NOTE  Subjective:  Tracy [name] is a 34 y.o. O1B5102 at [redacted]w[redacted]d being seen today for ongoing prenatal care.  She is currently monitored for the following issues for this high-risk pregnancy and has ALLERGIC RHINITIS; Urinary tract infection, site not specified; Gestational diabetes; History of gestational diabetes; Group B streptococcal bacteriuria; Supervision of other normal pregnancy, antepartum; Polyhydramnios; LGA (large for gestational age) fetus affecting management of mother; and Anemia in pregnancy on their problem list.  Patient reports no complaints.  Contractions: Irritability. Vag. Bleeding: None.  Movement: Present. Denies leaking of fluid.   The following portions of the patient's history were reviewed and updated as appropriate: allergies, current medications, past family history, past medical history, past social history, past surgical history and problem list.   Objective:   Vitals:   11/05/19 1430  BP: 109/72  Pulse: (!) 105  Weight: 162 lb (73.5 kg)    Fetal Status: Fetal Heart Rate (bpm): 138 Fundal Height: 30 cm Movement: Present     General:  Alert, oriented and cooperative. Patient is in no acute distress.  Skin: Skin is warm and dry. No rash noted.   Cardiovascular: Normal heart rate noted  Respiratory: Normal respiratory effort, no problems with respiration noted  Abdomen: Soft, gravid, appropriate for gestational age.  Pain/Pressure: Present     Pelvic: Cervical exam deferred        Extremities: Normal range of motion.  Edema: None  Mental Status: Normal mood and affect. Normal behavior. Normal judgment and thought content.   Assessment and Plan:  Pregnancy: H8N2778 at [redacted]w[redacted]d 1. Supervision of other normal pregnancy, antepartum Patient is doing well  Urine culture sent given complaints of dysuria  2. Diet controlled gestational diabetes mellitus (GDM) in third trimester Patient did not bring CBG but reports fasting 95-120 and  pp 120-150 Will start metformin 500 BID  3. Anemia during pregnancy in third trimester Continue Iron supplement  4. Group B streptococcal bacteriuria Prophylaxis in labor  5. Excessive fetal growth affecting management of pregnancy in third trimester, single or unspecified fetus Follow up growth ultrasound 4/23  6. Polyhydramnios in third trimester complication, single or unspecified fetus   Preterm labor symptoms and general obstetric precautions including but not limited to vaginal bleeding, contractions, leaking of fluid and fetal movement were reviewed in detail with the patient. Please refer to After Visit Summary for other counseling recommendations.   Return in about 2 weeks (around 11/19/2019) for in person, ROB, High risk.  Future Appointments  Date Time Provider Department Center  11/21/2019  1:30 PM [facility] NURSE [facility]  11/21/2019  1:30 PM [facility] [place] 5 [facility]    Catalina Antigua, MD

## 2019-11-05 NOTE — Progress Notes (Signed)
HOB, she forgot her BS Log AM 97-125 after meals 120-150.  Complains of dysuria and heartburns.

## 2019-11-07 LAB — CULTURE, OB URINE

## 2019-11-07 LAB — URINE CULTURE, OB REFLEX

## 2019-11-07 NOTE — Progress Notes (Signed)
Patient was seen on 11/05/19 for Gestational Diabetes self-management class at the [facility]. The following learning objectives were met by the patient during this course:   States the definition of Gestational Diabetes  States why dietary management is important in controlling blood glucose  Describes the effects each nutrient has on blood glucose levels  Demonstrates ability to create a balanced meal plan  Demonstrates carbohydrate counting   States when to check blood glucose levels  Demonstrates proper blood glucose monitoring techniques  States the effect of stress and exercise on blood glucose levels  States the importance of limiting caffeine and abstaining from alcohol and smoking  Blood glucose monitor given: none, patient has meter  Patient instructed to monitor glucose levels: FBS: 60 - <95; 1 hour: <140; 2 hour: <120  Patient received handouts:  Nutrition Diabetes and Pregnancy, including carb counting list  Patient will be seen for follow-up as needed. 

## 2019-11-17 ENCOUNTER — Other Ambulatory Visit: Payer: Self-pay

## 2019-11-17 ENCOUNTER — Encounter (HOSPITAL_COMMUNITY): Payer: Self-pay | Admitting: Obstetrics and Gynecology

## 2019-11-17 ENCOUNTER — Inpatient Hospital Stay (HOSPITAL_COMMUNITY)
Admission: AD | Admit: 2019-11-17 | Discharge: 2019-11-18 | Disposition: A | Discharge status: Home / Self Care | Payer: Medicaid Other | Attending: Obstetrics and Gynecology | Admitting: Obstetrics and Gynecology

## 2019-11-17 DIAGNOSIS — R109 Unspecified abdominal pain: Secondary | ICD-10-CM | POA: Insufficient documentation

## 2019-11-17 DIAGNOSIS — Z3689 Encounter for other specified antenatal screening: Secondary | ICD-10-CM

## 2019-11-17 DIAGNOSIS — Z0371 Encounter for suspected problem with amniotic cavity and membrane ruled out: Secondary | ICD-10-CM

## 2019-11-17 DIAGNOSIS — Z87891 Personal history of nicotine dependence: Secondary | ICD-10-CM | POA: Diagnosis not present

## 2019-11-17 DIAGNOSIS — O4703 False labor before 37 completed weeks of gestation, third trimester: Secondary | ICD-10-CM

## 2019-11-17 DIAGNOSIS — Z3A30 30 weeks gestation of pregnancy: Secondary | ICD-10-CM | POA: Diagnosis not present

## 2019-11-17 LAB — URINALYSIS, ROUTINE W REFLEX MICROSCOPIC
Bilirubin Urine: NEGATIVE
Glucose, UA: 50 mg/dL — AB
Hgb urine dipstick: NEGATIVE
Ketones, ur: NEGATIVE mg/dL
Leukocytes,Ua: NEGATIVE
Nitrite: NEGATIVE
Protein, ur: NEGATIVE mg/dL
Specific Gravity, Urine: 1.014 (ref 1.005–1.030)
pH: 6 (ref 5.0–8.0)

## 2019-11-17 LAB — WET PREP, GENITAL
Clue Cells Wet Prep HPF POC: NONE SEEN
Sperm: NONE SEEN
Trich, Wet Prep: NONE SEEN
Yeast Wet Prep HPF POC: NONE SEEN

## 2019-11-17 LAB — GLUCOSE, CAPILLARY: Glucose-Capillary: 118 mg/dL — ABNORMAL HIGH (ref 70–99)

## 2019-11-17 MED ORDER — METFORMIN HCL 500 MG PO TABS
500.0000 mg | ORAL_TABLET | Freq: Once | ORAL | Status: AC
  Administered 2019-11-17: 500 mg via ORAL
  Filled 2019-11-17: qty 1

## 2019-11-17 MED ORDER — NIFEDIPINE 10 MG PO CAPS
10.0000 mg | ORAL_CAPSULE | ORAL | Status: AC | PRN
  Administered 2019-11-17 – 2019-11-18 (×3): 10 mg via ORAL
  Filled 2019-11-17 (×3): qty 1

## 2019-11-17 NOTE — MAU Note (Signed)
Pt reports to MAU c/o "a lot of pain in her lower back and pelvic pressure." pt states when she goes to the BR she has the urge to push. No bleeding. Pt states she started leaking fluid around 1700 clear and watery. Pt reports ctx every 5 min. +FM. Pt reports also having burning when she pees. Pt reports she has GDM and sometimes she feels very tired.

## 2019-11-17 NOTE — MAU Provider Note (Signed)
History     CSN: [id number]  Arrival date and time: 11/17/19 2208   First Provider Initiated Contact with Patient 11/17/19 2258      Chief Complaint  Patient presents with  . Abdominal Pain  . Back Pain  . Rupture of Membranes   Tracy [name] is a 34 y.o. G6P2 at 3w1dwho presents to MAU with complaints of pelvic pain, backpain and possible LOF. Patient reports symptoms started occurring around 1700 this evening. Reports possible leaking of fluid, denies having to wear a pad or panty liner for leaking, she reports she has not seen anything leaking since being in MAU. Last intercourse last night. She reports pelvic pain and back pain are intermittent every 5 minutes and feels like contractions, rates pain 5/10. She denies hx of PTL or PTD. Pregnancy is complicated by GDM on metformin and polyhydramnios. Patient reports that she has not taken Metformin today, last taken last night. She also reports continued dysuria since visit on 4/7, which urine culture was negative that day. +FM. Denies vaginal bleeding.    OB History    Gravida  6   Para  2   Term  2   Preterm  0   AB  3   Living  2     SAB  3   TAB  0   Ectopic  0   Multiple  0   Live Births  2           Past Medical History:  Diagnosis Date  . Depression   . Diabetes mellitus without complication (HCC)    diet controlled  . Gestational diabetes   . Teeth problem    cracked molars, pt unsure if just right side or both sides    Past Surgical History:  Procedure Laterality Date  . NO PAST SURGERIES      History reviewed. No pertinent family history.  Social History   Tobacco Use  . Smoking status: Former Smoker    Types: Cigarettes  . Smokeless tobacco: Never Used  Substance Use Topics  . Alcohol use: No  . Drug use: No    Allergies: No Known Allergies  Medications Prior to Admission  Medication Sig Dispense Refill Last Dose  . Accu-Chek Softclix Lancets lancets 1 each by  Other route 4 (four) times daily. 100 each 12 11/17/2019 at Unknown time  . aspirin 81 MG chewable tablet Chew 1 tablet (81 mg total) by mouth daily. 30 tablet 5 11/16/2019 at Unknown time  . Blood Glucose Monitoring Suppl (ACCU-CHEK GUIDE) w/Device KIT 1 Device by Does not apply route 4 (four) times daily. 1 kit 0 11/17/2019 at Unknown time  . ferrous sulfate 325 (65 FE) MG tablet Take 1 tablet (325 mg total) by mouth daily. 30 tablet 3 11/16/2019 at Unknown time  . glucose blood (ACCU-CHEK GUIDE) test strip Use to check blood sugars four times a day was instructed 50 each 12 11/16/2019 at Unknown time  . metFORMIN (GLUCOPHAGE) 500 MG tablet Take 1 tablet (500 mg total) by mouth 2 (two) times daily with a meal. 60 tablet 5 11/17/2019 at Unknown time  . Prenatal Vit-Fe Phos-FA-Omega (VITAFOL GUMMIES) 3.33-0.333-34.8 MG CHEW Chew 1 each by mouth 3 (three) times daily. 90 tablet 11 11/16/2019 at Unknown time  . Blood Pressure Monitoring (BLOOD PRESSURE CUFF) MISC 1 Device by Does not apply route once a week. (Patient not taking: Reported on 10/15/2019) 1 each 0   . terconazole (TERAZOL 3) 0.8 %  vaginal cream Place 1 applicator vaginally at bedtime. Apply nightly for three nights. 20 g 0 Unknown at Unknown time    Review of Systems  Constitutional: Negative.   Respiratory: Negative.   Cardiovascular: Negative.   Gastrointestinal: Positive for abdominal pain. Negative for constipation, diarrhea, nausea and vomiting.  Genitourinary: Positive for dysuria and pelvic pain. Negative for decreased urine volume, difficulty urinating, flank pain, frequency, urgency and vaginal bleeding.  Musculoskeletal: Positive for back pain.  Neurological: Negative.   Psychiatric/Behavioral: Negative.    Physical Exam   Blood pressure 117/70, pulse (!) 108, temperature 98.3 F (36.8 C), temperature source Oral, resp. rate 19, weight 74.4 kg, last menstrual period 05/14/2019, unknown if currently breastfeeding.  Physical  Exam  Nursing note and vitals reviewed. Constitutional: She is oriented to person, place, and time. She appears well-developed and well-nourished. No distress.  Cardiovascular: Normal rate and regular rhythm.  Respiratory: Effort normal and breath sounds normal. No respiratory distress. She has no wheezes.  GI: Soft. There is abdominal tenderness. There is no rebound and no guarding.  Suprapubic tenderness with palpation   Genitourinary:    Vaginal discharge present.     No vaginal bleeding.  No bleeding in the vagina.    Genitourinary Comments: Pelvic exam: Cervix pink, visually open, without lesion, moderate amount of white creamy discharge, negative pooling,  vaginal walls and external genitalia normal   Musculoskeletal:        General: No edema. Normal range of motion.  Neurological: She is alert and oriented to person, place, and time.  Psychiatric: She has a normal mood and affect. Her behavior is normal. Thought content normal.   Dilation: Fingertip Effacement (%): 40 Cervical Position: Posterior Station: Ballotable Presentation: Vertex Exam by:: Wende Bushy CNM  FHR: 140/moderate/positive accelerations/no decelerations Toco: UI with irregular mild contractions by palpation  MAU Course  Procedures  MDM Orders Placed This Encounter  Procedures  . Culture, OB Urine  . Wet prep, genital  . Urinalysis, Routine w reflex microscopic  . Glucose, capillary  . POCT fern test   Meds ordered this encounter  Medications  . metFORMIN (GLUCOPHAGE) tablet 500 mg  . NIFEdipine (PROCARDIA) capsule 10 mg   Treatments MAU included Metformin x1 dose due to patient not taking Metformin today and Procardia x3 doses for preterm contractions.  Labs reviewed:  Results for orders placed or performed during the hospital encounter of 11/17/19 (from the past 24 hour(s))  Urinalysis, Routine w reflex microscopic     Status: Abnormal   Collection Time: 11/17/19 10:24 PM  Result Value Ref Range    Color, Urine YELLOW YELLOW   APPearance CLOUDY (A) CLEAR   Specific Gravity, Urine 1.014 1.005 - 1.030   pH 6.0 5.0 - 8.0   Glucose, UA 50 (A) NEGATIVE mg/dL   Hgb urine dipstick NEGATIVE NEGATIVE   Bilirubin Urine NEGATIVE NEGATIVE   Ketones, ur NEGATIVE NEGATIVE mg/dL   Protein, ur NEGATIVE NEGATIVE mg/dL   Nitrite NEGATIVE NEGATIVE   Leukocytes,Ua NEGATIVE NEGATIVE  Wet prep, genital     Status: Abnormal   Collection Time: 11/17/19 11:13 PM  Result Value Ref Range   Yeast Wet Prep HPF POC NONE SEEN NONE SEEN   Trich, Wet Prep NONE SEEN NONE SEEN   Clue Cells Wet Prep HPF POC NONE SEEN NONE SEEN   WBC, Wet Prep HPF POC MANY (A) NONE SEEN   Sperm NONE SEEN   Glucose, capillary     Status: Abnormal   Collection  Time: 11/17/19 11:31 PM  Result Value Ref Range   Glucose-Capillary 118 (H) 70 - 99 mg/dL  POCT fern test     Status: Normal   Collection Time: 11/18/19  1:19 AM  Result Value Ref Range   POCT Fern Test Negative = intact amniotic membranes    Reassessment after an hour and a half.  No cervical change.  Patient reports that pelvic pressure and back pain is resolved.  She denies continued leaking of fluid, denies vaginal bleeding.  She reports next prenatal appointment is this week.  Encouraged to follow-up as scheduled for prenatal care.  NST reactive and reassuring.   Discussed reasons to return to MAU. Return to MAU as needed. Pt stable at time of discharge.  Assessment and Plan   1. Encounter for suspected premature rupture of amniotic membranes, with rupture of membranes not found   2. Preterm uterine contractions in third trimester, antepartum   3. [redacted] weeks gestation of pregnancy   4. NST (non-stress test) reactive    Discharge home Follow up as scheduled in the office for prenatal care Return to MAU as needed for reasons discussed and/or emergencies  Hydration and [facility]   Follow-up [facility]. Go on 11/19/2019.    Specialty: Obstetrics and Gynecology Why: Follow up as scheduled for prenatal care appointment and return to MAU as needed  Contact information: [address], [place] [phone]         Allergies as of 11/18/2019   No Known Allergies     Medication List    TAKE these medications   Accu-Chek Guide test strip Generic drug: glucose blood Use to check blood sugars four times a day was instructed   Accu-Chek Guide w/Device Kit 1 Device by Does not apply route 4 (four) times daily.   Accu-Chek Softclix Lancets lancets 1 each by Other route 4 (four) times daily.   aspirin 81 MG chewable tablet Chew 1 tablet (81 mg total) by mouth daily.   Blood Pressure Cuff Misc 1 Device by Does not apply route once a week.   ferrous sulfate 325 (65 FE) MG tablet Take 1 tablet (325 mg total) by mouth daily.   metFORMIN 500 MG tablet Commonly known as: GLUCOPHAGE Take 1 tablet (500 mg total) by mouth 2 (two) times daily with a meal.   terconazole 0.8 % vaginal cream Commonly known as: TERAZOL 3 Place 1 applicator vaginally at bedtime. Apply nightly for three nights.   Vitafol Gummies 3.33-0.333-34.8 MG Chew Chew 1 each by mouth 3 (three) times daily.        Lajean Manes CNM 11/17/2019, 11:11 PM

## 2019-11-18 DIAGNOSIS — O4703 False labor before 37 completed weeks of gestation, third trimester: Secondary | ICD-10-CM

## 2019-11-18 DIAGNOSIS — Z0371 Encounter for suspected problem with amniotic cavity and membrane ruled out: Secondary | ICD-10-CM

## 2019-11-18 DIAGNOSIS — Z3A30 30 weeks gestation of pregnancy: Secondary | ICD-10-CM

## 2019-11-18 LAB — POCT FERN TEST: POCT Fern Test: NEGATIVE

## 2019-11-19 ENCOUNTER — Encounter: Payer: Medicaid Other | Admitting: Obstetrics and Gynecology

## 2019-11-19 LAB — CULTURE, OB URINE: Culture: NO GROWTH

## 2019-11-19 LAB — GC/CHLAMYDIA PROBE AMP (~~LOC~~) NOT AT ARMC
Chlamydia: NEGATIVE
Comment: NEGATIVE
Comment: NORMAL
Neisseria Gonorrhea: NEGATIVE

## 2019-11-21 ENCOUNTER — Other Ambulatory Visit: Payer: Self-pay

## 2019-11-21 ENCOUNTER — Ambulatory Visit (HOSPITAL_COMMUNITY): Payer: Medicaid Other | Admitting: *Deleted

## 2019-11-21 ENCOUNTER — Ambulatory Visit (HOSPITAL_COMMUNITY)
Admission: RE | Admit: 2019-11-21 | Discharge: 2019-11-21 | Disposition: A | Discharge status: Home / Self Care | Payer: Medicaid Other | Source: Ambulatory Visit | Attending: Obstetrics and Gynecology | Admitting: Obstetrics and Gynecology

## 2019-11-21 ENCOUNTER — Other Ambulatory Visit (HOSPITAL_COMMUNITY): Payer: Self-pay | Admitting: *Deleted

## 2019-11-21 ENCOUNTER — Encounter (HOSPITAL_COMMUNITY): Payer: Self-pay

## 2019-11-21 DIAGNOSIS — O24415 Gestational diabetes mellitus in pregnancy, controlled by oral hypoglycemic drugs: Secondary | ICD-10-CM | POA: Diagnosis not present

## 2019-11-21 DIAGNOSIS — O403XX0 Polyhydramnios, third trimester, not applicable or unspecified: Secondary | ICD-10-CM | POA: Diagnosis present

## 2019-11-21 DIAGNOSIS — O3663X0 Maternal care for excessive fetal growth, third trimester, not applicable or unspecified: Secondary | ICD-10-CM | POA: Diagnosis present

## 2019-11-21 DIAGNOSIS — Z8632 Personal history of gestational diabetes: Secondary | ICD-10-CM | POA: Insufficient documentation

## 2019-11-21 DIAGNOSIS — O99013 Anemia complicating pregnancy, third trimester: Secondary | ICD-10-CM | POA: Insufficient documentation

## 2019-11-21 DIAGNOSIS — Z3480 Encounter for supervision of other normal pregnancy, unspecified trimester: Secondary | ICD-10-CM | POA: Diagnosis present

## 2019-11-21 DIAGNOSIS — O24410 Gestational diabetes mellitus in pregnancy, diet controlled: Secondary | ICD-10-CM | POA: Diagnosis present

## 2019-11-21 DIAGNOSIS — Z362 Encounter for other antenatal screening follow-up: Secondary | ICD-10-CM | POA: Diagnosis not present

## 2019-11-21 DIAGNOSIS — Z3A30 30 weeks gestation of pregnancy: Secondary | ICD-10-CM

## 2019-11-25 ENCOUNTER — Encounter: Payer: Self-pay | Admitting: Obstetrics and Gynecology

## 2019-11-25 ENCOUNTER — Ambulatory Visit (INDEPENDENT_AMBULATORY_CARE_PROVIDER_SITE_OTHER): Payer: Medicaid Other | Admitting: Obstetrics and Gynecology

## 2019-11-25 ENCOUNTER — Other Ambulatory Visit: Payer: Self-pay

## 2019-11-25 VITALS — BP 109/71 | HR 101 | Wt 165.1 lb

## 2019-11-25 DIAGNOSIS — Z3A31 31 weeks gestation of pregnancy: Secondary | ICD-10-CM

## 2019-11-25 DIAGNOSIS — R8271 Bacteriuria: Secondary | ICD-10-CM

## 2019-11-25 DIAGNOSIS — O403XX0 Polyhydramnios, third trimester, not applicable or unspecified: Secondary | ICD-10-CM

## 2019-11-25 DIAGNOSIS — O3663X0 Maternal care for excessive fetal growth, third trimester, not applicable or unspecified: Secondary | ICD-10-CM

## 2019-11-25 DIAGNOSIS — O24415 Gestational diabetes mellitus in pregnancy, controlled by oral hypoglycemic drugs: Secondary | ICD-10-CM

## 2019-11-25 DIAGNOSIS — O99013 Anemia complicating pregnancy, third trimester: Secondary | ICD-10-CM

## 2019-11-25 DIAGNOSIS — Z3483 Encounter for supervision of other normal pregnancy, third trimester: Secondary | ICD-10-CM

## 2019-11-25 DIAGNOSIS — Z3480 Encounter for supervision of other normal pregnancy, unspecified trimester: Secondary | ICD-10-CM

## 2019-11-25 MED ORDER — METFORMIN HCL 1000 MG PO TABS: 1000.0000 mg | ORAL_TABLET | Freq: Two times a day (BID) | ORAL | 1 refills | Status: DC

## 2019-11-25 NOTE — Progress Notes (Signed)
   PRENATAL VISIT NOTE  Subjective:  Tracy [name] is a 34 y.o. R1V4008 at [redacted]w[redacted]d being seen today for ongoing prenatal care.  She is currently monitored for the following issues for this high-risk pregnancy and has ALLERGIC RHINITIS; Urinary tract infection, site not specified; Gestational diabetes; History of gestational diabetes; Group B streptococcal bacteriuria; Supervision of other normal pregnancy, antepartum; Polyhydramnios; LGA (large for gestational age) fetus affecting management of mother; and Anemia in pregnancy on their problem list.  Patient reports no complaints.  Contractions: Irregular. Vag. Bleeding: None.  Movement: Present. Denies leaking of fluid.   The following portions of the patient's history were reviewed and updated as appropriate: allergies, current medications, past family history, past medical history, past social history, past surgical history and problem list.   Objective:   Vitals:   11/25/19 1520  BP: 109/71  Pulse: (!) 101  Weight: 165 lb 1.6 oz (74.9 kg)    Fetal Status: Fetal Heart Rate (bpm): 168 Fundal Height: 33 cm Movement: Present     General:  Alert, oriented and cooperative. Patient is in no acute distress.  Skin: Skin is warm and dry. No rash noted.   Cardiovascular: Normal heart rate noted  Respiratory: Normal respiratory effort, no problems with respiration noted  Abdomen: Soft, gravid, appropriate for gestational age.  Pain/Pressure: Absent     Pelvic: Cervical exam performed in the presence of a chaperone Dilation: Fingertip Effacement (%): Thick Station: Ballotable  Extremities: Normal range of motion.  Edema: None  Mental Status: Normal mood and affect. Normal behavior. Normal judgment and thought content.   Assessment and Plan:  Pregnancy: [id number] at [redacted]w[redacted]d 1. Supervision of other normal pregnancy, antepartum Patient is doing well without complaints  2. Gestational diabetes mellitus (GDM) in third trimester controlled on  oral hypoglycemic drug Patient did not bring log book and reports highest fasting 109 with most values in 100's and pp in 120's range Will increase metformin to 1000 BID Weekly antenatal testing starting next week  3. Excessive fetal growth affecting management of pregnancy in third trimester, single or unspecified fetus   4. Polyhydramnios in third trimester complication, single or unspecified fetus   5. Group B streptococcal bacteriuria Prophylaxis in labor  6. Anemia during pregnancy in third trimester Continue iron supplement  Preterm labor symptoms and general obstetric precautions including but not limited to vaginal bleeding, contractions, leaking of fluid and fetal movement were reviewed in detail with the patient. Please refer to After Visit Summary for other counseling recommendations.   Return in about 2 weeks (around 12/09/2019) for in person, ROB, High risk.  Future Appointments  Date Time Provider Department Center  12/03/2019  2:45 PM [facility] NURSE [facility]  12/03/2019  2:45 PM [facility] [place] 5 [facility]  12/12/2019  2:00 PM [facility] NURSE [facility]  12/12/2019  2:00 PM [facility] [place] 1 [facility]-US  12/18/2019  2:00 PM [facility] NURSE [facility]  12/18/2019  2:00 PM [facility] [place] 1 [facility] MFC-US    Catalina Antigua, MD

## 2019-11-25 NOTE — Progress Notes (Signed)
Pt is here for ROB, [redacted]w[redacted]d.  

## 2019-12-03 ENCOUNTER — Encounter: Payer: Self-pay | Admitting: *Deleted

## 2019-12-03 ENCOUNTER — Other Ambulatory Visit: Payer: Self-pay

## 2019-12-03 ENCOUNTER — Ambulatory Visit: Payer: Medicaid Other | Admitting: *Deleted

## 2019-12-03 ENCOUNTER — Ambulatory Visit (HOSPITAL_COMMUNITY): Payer: Medicaid Other | Attending: Obstetrics and Gynecology

## 2019-12-03 DIAGNOSIS — Z3480 Encounter for supervision of other normal pregnancy, unspecified trimester: Secondary | ICD-10-CM | POA: Insufficient documentation

## 2019-12-03 DIAGNOSIS — Z8632 Personal history of gestational diabetes: Secondary | ICD-10-CM | POA: Diagnosis present

## 2019-12-03 DIAGNOSIS — Z3A32 32 weeks gestation of pregnancy: Secondary | ICD-10-CM

## 2019-12-03 DIAGNOSIS — O24415 Gestational diabetes mellitus in pregnancy, controlled by oral hypoglycemic drugs: Secondary | ICD-10-CM

## 2019-12-09 ENCOUNTER — Encounter: Payer: Medicaid Other | Admitting: Obstetrics and Gynecology

## 2019-12-12 ENCOUNTER — Ambulatory Visit (HOSPITAL_COMMUNITY): Payer: Medicaid Other | Attending: Obstetrics and Gynecology

## 2019-12-12 ENCOUNTER — Encounter: Payer: Self-pay | Admitting: *Deleted

## 2019-12-12 ENCOUNTER — Other Ambulatory Visit: Payer: Self-pay

## 2019-12-12 ENCOUNTER — Ambulatory Visit: Payer: Medicaid Other | Admitting: *Deleted

## 2019-12-12 DIAGNOSIS — Z8632 Personal history of gestational diabetes: Secondary | ICD-10-CM

## 2019-12-12 DIAGNOSIS — Z3480 Encounter for supervision of other normal pregnancy, unspecified trimester: Secondary | ICD-10-CM | POA: Diagnosis present

## 2019-12-12 DIAGNOSIS — Z3A33 33 weeks gestation of pregnancy: Secondary | ICD-10-CM

## 2019-12-12 DIAGNOSIS — O24415 Gestational diabetes mellitus in pregnancy, controlled by oral hypoglycemic drugs: Secondary | ICD-10-CM | POA: Diagnosis present

## 2019-12-15 ENCOUNTER — Ambulatory Visit (INDEPENDENT_AMBULATORY_CARE_PROVIDER_SITE_OTHER): Payer: Medicaid Other | Admitting: Obstetrics & Gynecology

## 2019-12-15 ENCOUNTER — Other Ambulatory Visit: Payer: Self-pay

## 2019-12-15 ENCOUNTER — Encounter: Payer: Self-pay | Admitting: Obstetrics & Gynecology

## 2019-12-15 VITALS — BP 112/79 | HR 96 | Wt 166.0 lb

## 2019-12-15 DIAGNOSIS — Z3480 Encounter for supervision of other normal pregnancy, unspecified trimester: Secondary | ICD-10-CM

## 2019-12-15 DIAGNOSIS — O403XX0 Polyhydramnios, third trimester, not applicable or unspecified: Secondary | ICD-10-CM

## 2019-12-15 DIAGNOSIS — Z3A34 34 weeks gestation of pregnancy: Secondary | ICD-10-CM

## 2019-12-15 DIAGNOSIS — O3663X0 Maternal care for excessive fetal growth, third trimester, not applicable or unspecified: Secondary | ICD-10-CM

## 2019-12-15 DIAGNOSIS — O24415 Gestational diabetes mellitus in pregnancy, controlled by oral hypoglycemic drugs: Secondary | ICD-10-CM

## 2019-12-15 MED ORDER — POLYETHYLENE GLYCOL 3350 17 G PO PACK: 17.0000 g | PACK | Freq: Every day | ORAL | 0 refills | Status: DC

## 2019-12-15 NOTE — Patient Instructions (Signed)

## 2019-12-15 NOTE — Progress Notes (Signed)
ROB  Blood Sugars have been better per pt. Pt forgot log book.    CC: Constipation x 3 days.

## 2019-12-15 NOTE — Progress Notes (Signed)
   PRENATAL VISIT NOTE  Subjective:  Tracy [name] is a 34 y.o. [id number] at [redacted]w[redacted]d being seen today for ongoing prenatal care.  She is currently monitored for the following issues for this high-risk pregnancy and has ALLERGIC RHINITIS; Urinary tract infection, site not specified; Gestational diabetes; History of gestational diabetes; Group B streptococcal bacteriuria; Supervision of other normal pregnancy, antepartum; Polyhydramnios; LGA (large for gestational age) fetus affecting management of mother; and Anemia in pregnancy on their problem list.  Patient reports no complaints.  Contractions: Not present. Vag. Bleeding: None.  Movement: Present. Denies leaking of fluid.   The following portions of the patient's history were reviewed and updated as appropriate: allergies, current medications, past family history, past medical history, past social history, past surgical history and problem list.   Objective:   Vitals:   12/15/19 1312  BP: 112/79  Pulse: 96  Weight: 166 lb (75.3 kg)    Fetal Status: Fetal Heart Rate (bpm): 136   Movement: Present     General:  Alert, oriented and cooperative. Patient is in no acute distress.  Skin: Skin is warm and dry. No rash noted.   Cardiovascular: Normal heart rate noted  Respiratory: Normal respiratory effort, no problems with respiration noted  Abdomen: Soft, gravid, appropriate for gestational age.  Pain/Pressure: Present     Pelvic: Cervical exam deferred        Extremities: Normal range of motion.  Edema: None  Mental Status: Normal mood and affect. Normal behavior. Normal judgment and thought content.   Assessment and Plan:  Pregnancy: [id number] at [redacted]w[redacted]d 1. Supervision of other normal pregnancy, antepartum constipation - polyethylene glycol (MIRALAX / GLYCOLAX) 17 g packet; Take 17 g by mouth daily.  Dispense: 14 each; Refill: 0  2. Excessive fetal growth affecting management of pregnancy in third trimester, single or unspecified  fetus F/u growth [place] next week  3. Polyhydramnios in third trimester complication, single or unspecified fetus F/u US  4. Gestational diabetes mellitus (GDM) in third trimester controlled on oral hypoglycemic drug PP <120 but fbs 96, 98 she reports. Urged dietary compliance, continue present dosing  Preterm labor symptoms and general obstetric precautions including but not limited to vaginal bleeding, contractions, leaking of fluid and fetal movement were reviewed in detail with the patient. Please refer to After Visit Summary for other counseling recommendations.   Return in about 1 week (around 12/22/2019).  Future Appointments  Date Time Provider Department Center  12/18/2019  2:00 PM Granvil Djordjevic [facility] NURSE [facility]  12/18/2019  2:00 PM [facility]  12/30/2019  9:00 AM Malachy Chamber, MD [facility] None    Scheryl Darter, MD

## 2019-12-18 ENCOUNTER — Ambulatory Visit: Payer: Medicaid Other

## 2019-12-19 ENCOUNTER — Ambulatory Visit: Payer: Medicaid Other | Attending: Obstetrics

## 2019-12-19 ENCOUNTER — Other Ambulatory Visit: Payer: Self-pay

## 2019-12-19 ENCOUNTER — Ambulatory Visit: Payer: Medicaid Other | Admitting: *Deleted

## 2019-12-19 DIAGNOSIS — Z3A34 34 weeks gestation of pregnancy: Secondary | ICD-10-CM | POA: Diagnosis not present

## 2019-12-19 DIAGNOSIS — Z8632 Personal history of gestational diabetes: Secondary | ICD-10-CM

## 2019-12-19 DIAGNOSIS — O403XX0 Polyhydramnios, third trimester, not applicable or unspecified: Secondary | ICD-10-CM | POA: Diagnosis not present

## 2019-12-19 DIAGNOSIS — O24415 Gestational diabetes mellitus in pregnancy, controlled by oral hypoglycemic drugs: Secondary | ICD-10-CM | POA: Diagnosis present

## 2019-12-19 DIAGNOSIS — Z3480 Encounter for supervision of other normal pregnancy, unspecified trimester: Secondary | ICD-10-CM | POA: Insufficient documentation

## 2019-12-22 ENCOUNTER — Other Ambulatory Visit: Payer: Self-pay | Admitting: *Deleted

## 2019-12-22 DIAGNOSIS — O24415 Gestational diabetes mellitus in pregnancy, controlled by oral hypoglycemic drugs: Secondary | ICD-10-CM

## 2019-12-26 ENCOUNTER — Ambulatory Visit: Payer: Medicaid Other | Attending: Obstetrics and Gynecology

## 2019-12-26 ENCOUNTER — Other Ambulatory Visit: Payer: Self-pay

## 2019-12-26 ENCOUNTER — Ambulatory Visit: Payer: Medicaid Other | Admitting: *Deleted

## 2019-12-26 ENCOUNTER — Other Ambulatory Visit: Payer: Self-pay | Admitting: *Deleted

## 2019-12-26 DIAGNOSIS — Z3480 Encounter for supervision of other normal pregnancy, unspecified trimester: Secondary | ICD-10-CM | POA: Insufficient documentation

## 2019-12-26 DIAGNOSIS — Z3A35 35 weeks gestation of pregnancy: Secondary | ICD-10-CM | POA: Diagnosis not present

## 2019-12-26 DIAGNOSIS — O24415 Gestational diabetes mellitus in pregnancy, controlled by oral hypoglycemic drugs: Secondary | ICD-10-CM

## 2019-12-26 DIAGNOSIS — Z8632 Personal history of gestational diabetes: Secondary | ICD-10-CM | POA: Diagnosis present

## 2019-12-30 ENCOUNTER — Encounter: Payer: Medicaid Other | Admitting: Obstetrics & Gynecology

## 2020-01-02 ENCOUNTER — Ambulatory Visit: Payer: Medicaid Other

## 2020-01-02 ENCOUNTER — Other Ambulatory Visit: Payer: Self-pay

## 2020-01-02 ENCOUNTER — Ambulatory Visit: Payer: Medicaid Other | Attending: Obstetrics

## 2020-01-02 ENCOUNTER — Ambulatory Visit: Payer: Medicaid Other | Admitting: *Deleted

## 2020-01-02 DIAGNOSIS — Z3480 Encounter for supervision of other normal pregnancy, unspecified trimester: Secondary | ICD-10-CM | POA: Insufficient documentation

## 2020-01-02 DIAGNOSIS — Z3A36 36 weeks gestation of pregnancy: Secondary | ICD-10-CM

## 2020-01-02 DIAGNOSIS — Z8632 Personal history of gestational diabetes: Secondary | ICD-10-CM | POA: Diagnosis present

## 2020-01-02 DIAGNOSIS — O24415 Gestational diabetes mellitus in pregnancy, controlled by oral hypoglycemic drugs: Secondary | ICD-10-CM | POA: Diagnosis present

## 2020-01-05 ENCOUNTER — Other Ambulatory Visit: Payer: Self-pay | Admitting: *Deleted

## 2020-01-05 DIAGNOSIS — O3660X0 Maternal care for excessive fetal growth, unspecified trimester, not applicable or unspecified: Secondary | ICD-10-CM

## 2020-01-07 ENCOUNTER — Other Ambulatory Visit (HOSPITAL_COMMUNITY)
Admission: RE | Admit: 2020-01-07 | Discharge: 2020-01-07 | Disposition: A | Discharge status: Home / Self Care | Payer: Medicaid Other | Source: Ambulatory Visit | Attending: Obstetrics & Gynecology | Admitting: Obstetrics & Gynecology

## 2020-01-07 ENCOUNTER — Ambulatory Visit (INDEPENDENT_AMBULATORY_CARE_PROVIDER_SITE_OTHER): Payer: Medicaid Other | Admitting: Obstetrics & Gynecology

## 2020-01-07 ENCOUNTER — Other Ambulatory Visit: Payer: Self-pay

## 2020-01-07 VITALS — BP 120/79 | HR 90 | Wt 171.0 lb

## 2020-01-07 DIAGNOSIS — O24415 Gestational diabetes mellitus in pregnancy, controlled by oral hypoglycemic drugs: Secondary | ICD-10-CM

## 2020-01-07 DIAGNOSIS — Z3480 Encounter for supervision of other normal pregnancy, unspecified trimester: Secondary | ICD-10-CM

## 2020-01-07 DIAGNOSIS — Z3A37 37 weeks gestation of pregnancy: Secondary | ICD-10-CM

## 2020-01-07 NOTE — Progress Notes (Signed)
Induction Assessment Scheduling Form: Fax to [facility]:  [phone] Route to [facility] Labor Delivery   Tracy [name]                                                                                   DOB:  [birth year]                                                            MRN:  [id number]  Phone:  Home Phone [phone]  Mobile [phone]    Provider:  [facility] (Faculty Practice)  Admission Date/Time:  01/18/2020 GP:  [id number]     Gestational age on admission:  39                                               Estimated Date of Delivery: 01/25/20  Dating Criteria: [place] at 7.1 weeks  Filed Weights   01/07/20 1050  Weight: 171 lb (77.6 kg)    GBS:  Pending - done at 6/9 appt HIV:  Non Reactive (03/17 1109)  Reason for induction:  GDM, polyhydramnios,LGA    Method of induction(proposed):  Cytotec   Scheduling Provider Signature:  Marya Landry Dellion, CMA                  Per Dr Elbert Ewings. Mayford Knife                            [id number] Date:  01/07/2020

## 2020-01-07 NOTE — Patient Instructions (Signed)
Gestational Diabetes Mellitus, Diagnosis Gestational diabetes (gestational diabetes mellitus) is a short-term (temporary) form of diabetes that can happen during pregnancy. It goes away after you give birth. It may be caused by one or both of these problems:  Your pancreas does not make enough of a hormone called insulin.  Your body does not respond in a normal way to insulin that it makes. Insulin lets sugars (glucose) go into cells in the body. This gives you energy. If you have diabetes, sugars cannot get into cells. This causes high blood sugar (hyperglycemia). If you get gestational diabetes, you are:  More likely to get it if you get pregnant again.  More likely to develop type 2 diabetes in the future. If gestational diabetes is treated, it may not hurt you or your baby. Your doctor will set treatment goals for you. In general, you should have these blood sugar levels:  After not eating for a long time (fasting): 95 mg/dL (5.3 mmol/L).  After meals (postprandial): ? One hour after a meal: at or below 140 mg/dL (7.8 mmol/L). ? Two hours after a meal: at or below 120 mg/dL (6.7 mmol/L).  A1c (hemoglobin A1c) level: 6-6.5%. Follow these instructions at home: Questions to ask your doctor   You may want to ask these questions: ? Do I need to meet with a diabetes educator? ? What equipment will I need to care for myself at home? ? What medicines do I need? When should I take them? ? How often do I need to check my blood sugar? ? What number can I call if I have questions? ? When is my next doctor's visit? General instructions  Take over-the-counter and prescription medicines only as told by your doctor.  Stay at a healthy weight during pregnancy.  Keep all follow-up visits as told by your doctor. This is important. Contact a doctor if:  Your blood sugar is at or above 240 mg/dL (13.3 mmol/L).  Your blood sugar is at or above 200 mg/dL (11.1 mmol/L) and you have ketones in  your pee (urine).  You have been sick or have had a fever for 2 days or more and you are not getting better.  You have any of these problems for more than 6 hours: ? You cannot eat or drink. ? You feel sick to your stomach (nauseous). ? You throw up (vomit). ? You have watery poop (diarrhea). Get help right away if:  Your blood sugar is lower than 54 mg/dL (3 mmol/L).  You get confused.  You have trouble: ? Thinking clearly. ? Breathing.  Your baby moves less than normal.  You have any of these: ? Moderate or large ketone levels in your pee. ? Blood coming from your vagina. ? Unusual fluid coming from your vagina. ? Early contractions. These may feel like tightness in your belly. Summary  Gestational diabetes is a short-term form of diabetes. It can happen while you are pregnant. It goes away after you give birth.  If gestational diabetes is treated, it may not hurt you or your baby. Your doctor will set treatment goals for you.  Keep all follow-up visits as told by your doctor. This is important. This information is not intended to replace advice given to you by your health care provider. Make sure you discuss any questions you have with your health care provider. Document Revised: 08/23/2017 Document Reviewed: 08/20/2015 Elsevier Patient Education  2020 Elsevier Inc.  

## 2020-01-07 NOTE — Progress Notes (Signed)
Pt state she is having increase in pelvic pressure and ctx. Pt would like to know when she may be induced.

## 2020-01-07 NOTE — Progress Notes (Signed)
   PRENATAL VISIT NOTE  Subjective:  Tracy [name] is a 34 y.o. [phone] at [redacted]w[redacted]d being seen today for ongoing prenatal care.  She is currently monitored for the following issues for this high-risk pregnancy and has ALLERGIC RHINITIS; Urinary tract infection, site not specified; Gestational diabetes; History of gestational diabetes; Group B streptococcal bacteriuria; Supervision of other normal pregnancy, antepartum; Polyhydramnios; LGA (large for gestational age) fetus affecting management of mother; and Anemia in pregnancy on their problem list.  Patient reports pelvic pressure; Glucose log reviewed, fasting 90s, 2hr postprandial 110-120s.  Contractions: Irregular. Vag. Bleeding: None.  Movement: Present. Denies leaking of fluid.   The following portions of the patient's history were reviewed and updated as appropriate: allergies, current medications, past family history, past medical history, past social history, past surgical history and problem list.   Objective:   Vitals:   01/07/20 1050  BP: 120/79  Pulse: 90  Weight: 77.6 kg    Fetal Status: Fetal Heart Rate (bpm): 140   Movement: Present     General:  Alert, oriented and cooperative. Patient is in no acute distress.  Skin: Skin is warm and dry. No rash noted.   Cardiovascular: Normal heart rate noted  Respiratory: Normal respiratory effort, no problems with respiration noted  Abdomen: Soft, gravid, appropriate for gestational age.  Pain/Pressure: Present     Pelvic: Cervical exam performed in the presence of a chaperone        Extremities: Normal range of motion.     Mental Status: Normal mood and affect. Normal behavior. Normal judgment and thought content.   Assessment and Plan:  Pregnancy: [id number] at [redacted]w[redacted]d 1. Supervision of other normal pregnancy, antepartum Plan for IOL at 39wks - [place] [facility] OB FOLLOW UP; Future  2. Gestational diabetes mellitus (GDM) in third trimester controlled on oral hypoglycemic  drug Concern for macrosomia S> D growth U/S ordered.  - [place] [facility] OB FOLLOW UP; Future  Term labor symptoms and general obstetric precautions including but not limited to vaginal bleeding, contractions, leaking of fluid and fetal movement were reviewed in detail with the patient. Please refer to After Visit Summary for other counseling recommendations.   No follow-ups on file.  Future Appointments  Date Time Provider Department Center  01/09/2020 10:15 AM [facility] NURSE [facility]  01/09/2020 10:45 AM [facility] NST [facility]  01/16/2020 12:45 PM [facility] NURSE [facility]  01/16/2020 12:45 PM [facility] US5 [facility]    Malachy Chamber, MD

## 2020-01-08 ENCOUNTER — Telehealth (HOSPITAL_COMMUNITY): Payer: Self-pay | Admitting: *Deleted

## 2020-01-08 LAB — CERVICOVAGINAL ANCILLARY ONLY
Chlamydia: NEGATIVE
Comment: NEGATIVE
Comment: NORMAL
Neisseria Gonorrhea: NEGATIVE

## 2020-01-08 NOTE — Telephone Encounter (Signed)
Preadmission screen  

## 2020-01-09 ENCOUNTER — Telehealth (HOSPITAL_COMMUNITY): Payer: Self-pay | Admitting: *Deleted

## 2020-01-09 ENCOUNTER — Ambulatory Visit: Payer: Medicaid Other | Admitting: *Deleted

## 2020-01-09 ENCOUNTER — Other Ambulatory Visit: Payer: Self-pay

## 2020-01-09 ENCOUNTER — Ambulatory Visit: Payer: Medicaid Other | Attending: Obstetrics and Gynecology | Admitting: *Deleted

## 2020-01-09 DIAGNOSIS — Z3A00 Weeks of gestation of pregnancy not specified: Secondary | ICD-10-CM | POA: Insufficient documentation

## 2020-01-09 DIAGNOSIS — Z8632 Personal history of gestational diabetes: Secondary | ICD-10-CM

## 2020-01-09 DIAGNOSIS — Z3480 Encounter for supervision of other normal pregnancy, unspecified trimester: Secondary | ICD-10-CM

## 2020-01-09 DIAGNOSIS — O24415 Gestational diabetes mellitus in pregnancy, controlled by oral hypoglycemic drugs: Secondary | ICD-10-CM | POA: Diagnosis not present

## 2020-01-09 LAB — STREP GP B NAA: Strep Gp B NAA: NEGATIVE

## 2020-01-09 NOTE — Procedures (Signed)
Tracy [name] 05/13/1986 [redacted]w[redacted]d  Fetus A Non-Stress Test Interpretation for 01/09/20  Indication: Gestational Diabetes medication controlled and Polyhydramnios  Fetal Heart Rate A Mode: External Baseline Rate (A): 135 bpm Variability: Moderate Accelerations: 15 x 15 Decelerations: None Multiple birth?: No  Uterine Activity Mode: Toco, Palpation Contraction Frequency (min): none Resting Tone Palpated: Relaxed Resting Time: Adequate  Interpretation (Fetal Testing) Nonstress Test Interpretation: Reactive Comments: Reviewed tracing with Dr. Grace Bushy

## 2020-01-09 NOTE — Telephone Encounter (Signed)
Preadmission screen  

## 2020-01-12 ENCOUNTER — Other Ambulatory Visit: Payer: Self-pay | Admitting: Family Medicine

## 2020-01-12 ENCOUNTER — Other Ambulatory Visit (HOSPITAL_COMMUNITY): Payer: Self-pay | Admitting: Advanced Practice Midwife

## 2020-01-12 ENCOUNTER — Encounter (HOSPITAL_COMMUNITY): Payer: Self-pay | Admitting: *Deleted

## 2020-01-12 ENCOUNTER — Telehealth (HOSPITAL_COMMUNITY): Payer: Self-pay | Admitting: *Deleted

## 2020-01-12 ENCOUNTER — Encounter (HOSPITAL_COMMUNITY): Payer: Self-pay | Admitting: Obstetrics & Gynecology

## 2020-01-12 ENCOUNTER — Inpatient Hospital Stay (HOSPITAL_COMMUNITY)
Admission: AD | Admit: 2020-01-12 | Discharge: 2020-01-13 | Disposition: A | Discharge status: Home / Self Care | Payer: Medicaid Other | Attending: Obstetrics & Gynecology | Admitting: Obstetrics & Gynecology

## 2020-01-12 DIAGNOSIS — O3663X0 Maternal care for excessive fetal growth, third trimester, not applicable or unspecified: Secondary | ICD-10-CM

## 2020-01-12 DIAGNOSIS — O219 Vomiting of pregnancy, unspecified: Secondary | ICD-10-CM | POA: Diagnosis not present

## 2020-01-12 DIAGNOSIS — O99013 Anemia complicating pregnancy, third trimester: Secondary | ICD-10-CM

## 2020-01-12 DIAGNOSIS — E11649 Type 2 diabetes mellitus with hypoglycemia without coma: Secondary | ICD-10-CM | POA: Insufficient documentation

## 2020-01-12 DIAGNOSIS — O212 Late vomiting of pregnancy: Secondary | ICD-10-CM | POA: Insufficient documentation

## 2020-01-12 DIAGNOSIS — Z87891 Personal history of nicotine dependence: Secondary | ICD-10-CM | POA: Diagnosis not present

## 2020-01-12 DIAGNOSIS — Z7982 Long term (current) use of aspirin: Secondary | ICD-10-CM | POA: Diagnosis not present

## 2020-01-12 DIAGNOSIS — O24415 Gestational diabetes mellitus in pregnancy, controlled by oral hypoglycemic drugs: Secondary | ICD-10-CM | POA: Diagnosis not present

## 2020-01-12 DIAGNOSIS — Z3493 Encounter for supervision of normal pregnancy, unspecified, third trimester: Secondary | ICD-10-CM

## 2020-01-12 DIAGNOSIS — Z3480 Encounter for supervision of other normal pregnancy, unspecified trimester: Secondary | ICD-10-CM

## 2020-01-12 DIAGNOSIS — Z3A38 38 weeks gestation of pregnancy: Secondary | ICD-10-CM | POA: Diagnosis not present

## 2020-01-12 DIAGNOSIS — O403XX0 Polyhydramnios, third trimester, not applicable or unspecified: Secondary | ICD-10-CM | POA: Insufficient documentation

## 2020-01-12 DIAGNOSIS — Z8632 Personal history of gestational diabetes: Secondary | ICD-10-CM

## 2020-01-12 DIAGNOSIS — Z7984 Long term (current) use of oral hypoglycemic drugs: Secondary | ICD-10-CM | POA: Diagnosis not present

## 2020-01-12 DIAGNOSIS — Z3689 Encounter for other specified antenatal screening: Secondary | ICD-10-CM

## 2020-01-12 LAB — GLUCOSE, CAPILLARY: Glucose-Capillary: 115 mg/dL — ABNORMAL HIGH (ref 70–99)

## 2020-01-12 NOTE — Telephone Encounter (Signed)
Preadmission screen  

## 2020-01-12 NOTE — MAU Note (Signed)
Pt stated she was feeling nauseated an vomited. Took her blood sugar and it was 80. Tired to drink some soda and vomited that up too. Had felt shaky and dizzy. Feels better now but wanted to make sure everything was ok. CBG is 115 now.  Reports good fetal movement. C/o occasional ctx that she has been having on and off for several days 15-20 min apart 7/10. Reports some leaking since an hour ago.

## 2020-01-12 NOTE — Progress Notes (Signed)
Induction orders placed  Marlowe Alt, DO OB Fellow, [facility] 01/12/2020 4:27 PM

## 2020-01-13 ENCOUNTER — Encounter: Payer: Medicaid Other | Admitting: Obstetrics & Gynecology

## 2020-01-13 DIAGNOSIS — O219 Vomiting of pregnancy, unspecified: Secondary | ICD-10-CM

## 2020-01-13 DIAGNOSIS — O24415 Gestational diabetes mellitus in pregnancy, controlled by oral hypoglycemic drugs: Secondary | ICD-10-CM

## 2020-01-13 DIAGNOSIS — Z3A38 38 weeks gestation of pregnancy: Secondary | ICD-10-CM

## 2020-01-13 LAB — URINALYSIS, ROUTINE W REFLEX MICROSCOPIC
Bilirubin Urine: NEGATIVE
Glucose, UA: NEGATIVE mg/dL
Ketones, ur: NEGATIVE mg/dL
Leukocytes,Ua: NEGATIVE
Nitrite: NEGATIVE
Protein, ur: NEGATIVE mg/dL
Specific Gravity, Urine: 1.01 (ref 1.005–1.030)
pH: 7 (ref 5.0–8.0)

## 2020-01-13 NOTE — Progress Notes (Signed)
S/w pt for virtual visit. Pt reports fetal movement and irregular contractions. Pt does not have a BP cuff, reports fasting BG this morning at 100.

## 2020-01-13 NOTE — MAU Provider Note (Signed)
History     CSN: [id number]  Arrival date and time: 01/12/20 2244   First Provider Initiated Contact with Patient 01/12/20 2322      Chief Complaint  Patient presents with  . Nausea  . Hypoglycemia   HPI Tracy [name] is a 34 y.o. [id number] at 25w2dwho presents to MAU with chief complaint of nausea and leaking of fluid.   Nausea Patient's pregnancy is c/p A1GDM on Metformin. She stated she felt nauseated earlier tonight, vomited once, and obtained a CBG on her home equipment of 80. She continued to eat in small amounts and gradually felt better. She endorses repeat CBG within her target range and feels much better but "would like to make sure baby is ok".  Leaking of fluid Patient endorses new onset leaking about one hour before her arrival to MAU. She is unsure if she has continuous leaking. She endorses occasional contractions q 15-20 minutes.  She denies vaginal bleeding, decreased fetal movement, fever, falls, or recent illness.   OB History    Gravida  6   Para  2   Term  2   Preterm  0   AB  3   Living  2     SAB  3   TAB  0   Ectopic  0   Multiple  0   Live Births  2          Patient Active Problem List   Diagnosis Date Noted  . Anemia in pregnancy 10/16/2019  . LGA (large for gestational age) fetus affecting management of mother 10/15/2019  . Polyhydramnios 10/09/2019  . Supervision of other normal pregnancy, antepartum 09/12/2019  . Group B streptococcal bacteriuria 08/26/2019  . History of gestational diabetes 06/23/2016  . Gestational diabetes 06/20/2016  . Urinary tract infection, site not specified 12/05/2012  . ALLERGIC RHINITIS 05/31/2007     Past Medical History:  Diagnosis Date  . Depression   . Diabetes mellitus without complication (HCC)    diet controlled  . Gestational diabetes   . Teeth problem    cracked molars, pt unsure if just right side or both sides    Past Surgical History:  Procedure Laterality Date   . NO PAST SURGERIES      No family history on file.  Social History   Tobacco Use  . Smoking status: Former Smoker    Types: Cigarettes  . Smokeless tobacco: Never Used  Vaping Use  . Vaping Use: Never used  Substance Use Topics  . Alcohol use: No  . Drug use: No    Allergies: No Known Allergies  Medications Prior to Admission  Medication Sig Dispense Refill Last Dose  . aspirin 81 MG chewable tablet Chew 1 tablet (81 mg total) by mouth daily. 30 tablet 5 01/12/2020 at Unknown time  . ferrous sulfate 325 (65 FE) MG tablet Take 1 tablet (325 mg total) by mouth daily. 30 tablet 3 01/12/2020 at Unknown time  . metFORMIN (GLUCOPHAGE) 1000 MG tablet Take 1 tablet (1,000 mg total) by mouth 2 (two) times daily with a meal. 60 tablet 1 01/12/2020 at Unknown time  . Prenatal Vit-Fe Phos-FA-Omega (VITAFOL GUMMIES) 3.33-0.333-34.8 MG CHEW Chew 1 each by mouth 3 (three) times daily. 90 tablet 11 01/12/2020 at Unknown time  . Accu-Chek Softclix Lancets lancets 1 each by Other route 4 (four) times daily. 100 each 12   . Blood Glucose Monitoring Suppl (ACCU-CHEK GUIDE) w/Device KIT 1 Device by Does not apply route 4 (  four) times daily. 1 kit 0   . Blood Pressure Monitoring (BLOOD PRESSURE CUFF) MISC 1 Device by Does not apply route once a week. (Patient not taking: Reported on 10/15/2019) 1 each 0   . glucose blood (ACCU-CHEK GUIDE) test strip Use to check blood sugars four times a day was instructed 50 each 12   . metFORMIN (GLUCOPHAGE) 500 MG tablet Take 1 tablet (500 mg total) by mouth 2 (two) times daily with a meal. (Patient not taking: Reported on 12/15/2019) 60 tablet 5   . polyethylene glycol (MIRALAX / GLYCOLAX) 17 g packet Take 17 g by mouth daily. (Patient not taking: Reported on 12/19/2019) 14 each 0   . terconazole (TERAZOL 3) 0.8 % vaginal cream Place 1 applicator vaginally at bedtime. Apply nightly for three nights. (Patient not taking: Reported on 11/25/2019) 20 g 0     Review of  Systems  Constitutional: Negative for appetite change, chills, fatigue and fever.  Gastrointestinal: Positive for nausea and vomiting. Negative for abdominal pain.  Genitourinary: Negative for dysuria and vaginal bleeding.  Musculoskeletal: Negative for back pain.  All other systems reviewed and are negative.  Physical Exam   Blood pressure 125/85, pulse 93, temperature 98.4 F (36.9 C), resp. rate 18, height _0  (1.499 m), weight 76.2 kg, last menstrual period 05/14/2019, unknown if currently breastfeeding.  Physical Exam  Nursing note and vitals reviewed. Cardiovascular: Normal rate and normal pulses.  Respiratory: Effort normal and breath sounds normal.  GI:  Gravid    MAU Course/MDM  Procedures  --A2GDM on Metformin BID --CBG 115 in MAU, patient's complaints resolved prior to her arrival to MAU --Negative pooling, negative fern --Reactive tracing: baseline 135, moderate variability, positive accels, no decels --Toco: irregular mild contractions q 10-12 minutes --Patient accidentally dropped her UA specimen into the MAU commode and stated she was unable to provide and additional urine sample.   Patient Vitals for the past 24 hrs:  BP Temp Pulse Resp Height Weight  01/13/20 0002 121/78 -- 93 -- -- --  01/12/20 2314 125/85 98.4 F (36.9 C) 93 18 _1  (1.499 m) 76.2 kg   Assessment and Plan  --34 y.o. [id number] at [redacted]w[redacted]d --A2GDM (Metformin), Polyhdramnios --CBG 115, asymptomatic throughout MAU evaluation --Reactive tracing --Cervix 1/thick/posterior (unchanged from previous) --Discharge home in stable condition  [facility]: [facility] --IOL 01/18/2020  SDarlina Rumpf CMontezuma6/15/2021, 12:25 AM

## 2020-01-13 NOTE — Discharge Instructions (Signed)

## 2020-01-14 ENCOUNTER — Other Ambulatory Visit: Payer: Self-pay | Admitting: Advanced Practice Midwife

## 2020-01-14 NOTE — Progress Notes (Signed)
This encounter was created in error - please disregard.

## 2020-01-16 ENCOUNTER — Ambulatory Visit (HOSPITAL_BASED_OUTPATIENT_CLINIC_OR_DEPARTMENT_OTHER): Payer: Medicaid Other

## 2020-01-16 ENCOUNTER — Other Ambulatory Visit: Payer: Self-pay

## 2020-01-16 ENCOUNTER — Other Ambulatory Visit
Admission: RE | Admit: 2020-01-16 | Discharge: 2020-01-16 | Disposition: A | Discharge status: Home / Self Care | Payer: Medicaid Other | Source: Ambulatory Visit | Attending: Family Medicine | Admitting: Family Medicine

## 2020-01-16 ENCOUNTER — Ambulatory Visit: Payer: Medicaid Other | Admitting: *Deleted

## 2020-01-16 DIAGNOSIS — O3660X0 Maternal care for excessive fetal growth, unspecified trimester, not applicable or unspecified: Secondary | ICD-10-CM

## 2020-01-16 DIAGNOSIS — Z20822 Contact with and (suspected) exposure to covid-19: Secondary | ICD-10-CM | POA: Insufficient documentation

## 2020-01-16 DIAGNOSIS — Z3A38 38 weeks gestation of pregnancy: Secondary | ICD-10-CM

## 2020-01-16 DIAGNOSIS — Z8632 Personal history of gestational diabetes: Secondary | ICD-10-CM

## 2020-01-16 DIAGNOSIS — O401XX0 Polyhydramnios, first trimester, not applicable or unspecified: Secondary | ICD-10-CM

## 2020-01-16 DIAGNOSIS — Z3A00 Weeks of gestation of pregnancy not specified: Secondary | ICD-10-CM | POA: Diagnosis not present

## 2020-01-16 DIAGNOSIS — Z362 Encounter for other antenatal screening follow-up: Secondary | ICD-10-CM

## 2020-01-16 DIAGNOSIS — Z3480 Encounter for supervision of other normal pregnancy, unspecified trimester: Secondary | ICD-10-CM

## 2020-01-16 DIAGNOSIS — Z01812 Encounter for preprocedural laboratory examination: Secondary | ICD-10-CM | POA: Insufficient documentation

## 2020-01-16 DIAGNOSIS — O24415 Gestational diabetes mellitus in pregnancy, controlled by oral hypoglycemic drugs: Secondary | ICD-10-CM | POA: Diagnosis not present

## 2020-01-16 DIAGNOSIS — O403XX0 Polyhydramnios, third trimester, not applicable or unspecified: Secondary | ICD-10-CM

## 2020-01-16 DIAGNOSIS — O3663X0 Maternal care for excessive fetal growth, third trimester, not applicable or unspecified: Secondary | ICD-10-CM | POA: Diagnosis not present

## 2020-01-17 ENCOUNTER — Encounter (HOSPITAL_COMMUNITY): Payer: Self-pay | Admitting: Obstetrics and Gynecology

## 2020-01-17 ENCOUNTER — Inpatient Hospital Stay (EMERGENCY_DEPARTMENT_HOSPITAL)
Admission: AD | Admit: 2020-01-17 | Discharge: 2020-01-17 | Disposition: A | Discharge status: Home / Self Care | Payer: Medicaid Other | Source: Home / Self Care | Attending: Obstetrics and Gynecology | Admitting: Obstetrics and Gynecology

## 2020-01-17 ENCOUNTER — Inpatient Hospital Stay (HOSPITAL_COMMUNITY): Admit: 2020-01-17 | Payer: Medicaid Other | Admitting: Obstetrics and Gynecology

## 2020-01-17 ENCOUNTER — Other Ambulatory Visit: Payer: Self-pay

## 2020-01-17 DIAGNOSIS — Z3490 Encounter for supervision of normal pregnancy, unspecified, unspecified trimester: Secondary | ICD-10-CM

## 2020-01-17 DIAGNOSIS — O09893 Supervision of other high risk pregnancies, third trimester: Secondary | ICD-10-CM

## 2020-01-17 DIAGNOSIS — Z3689 Encounter for other specified antenatal screening: Secondary | ICD-10-CM

## 2020-01-17 DIAGNOSIS — Z3493 Encounter for supervision of normal pregnancy, unspecified, third trimester: Secondary | ICD-10-CM | POA: Insufficient documentation

## 2020-01-17 DIAGNOSIS — Z3A38 38 weeks gestation of pregnancy: Secondary | ICD-10-CM

## 2020-01-17 LAB — SARS CORONAVIRUS 2 (TAT 6-24 HRS): SARS Coronavirus 2: NEGATIVE

## 2020-01-17 MED ORDER — FENTANYL CITRATE (PF) 100 MCG/2ML IJ SOLN
100.0000 ug | Freq: Once | INTRAMUSCULAR | Status: AC
  Administered 2020-01-17: 100 ug via INTRAMUSCULAR
  Filled 2020-01-17: qty 2

## 2020-01-17 NOTE — MAU Note (Signed)
Patient reports to MAU for foley bulb placement. Denies VB and LOF, +movement.

## 2020-01-17 NOTE — MAU Provider Note (Signed)
Foley insertion note    Subjective:  Tracy [name] is a 34 y.o. [phone] at [redacted]w[redacted]d being seen today for ongoing prenatal care.  She is currently monitored for the following issues for this high-risk pregnancy and has ALLERGIC RHINITIS; Urinary tract infection, site not specified; Gestational diabetes; History of gestational diabetes; Group B streptococcal bacteriuria; Supervision of other normal pregnancy, antepartum; Polyhydramnios; LGA (large for gestational age) fetus affecting management of mother; and Anemia in pregnancy on their problem list.  Patient reports no complaints.   . Vag. Bleeding: None.   . Denies leaking of fluid.   The following portions of the patient's history were reviewed and updated as appropriate: allergies, current medications, past family history, past medical history, past social history, past surgical history and problem list. Problem list updated.  Objective:   Vitals:   01/17/20 2111 01/17/20 2120 01/17/20 2235  BP: 132/81 120/83 123/69  Pulse: (!) 103 100 86  Resp: 18    Temp: 98 F (36.7 C)    TempSrc: Oral    SpO2: 98%    Weight: 77.6 kg      Fetal Status:           General:  Alert, oriented and cooperative. Patient is in no acute distress.  Skin: Skin is warm and dry. No rash noted.   Cardiovascular: Normal heart rate noted  Respiratory: Normal respiratory effort, no problems with respiration noted  Abdomen: Soft, gravid, appropriate for gestational age.        Pelvic: Cervical exam performed        Extremities: Normal range of motion.     Mental Status:  Normal mood and affect. Normal behavior. Normal judgment and thought content.  Procedure: Patient informed of R/B/A of procedure. NST was performed and was reactive prior to procedure. NST:  EFM: Baseline: 140 bpm Toco: irregular, every 4-6 minutes Procedure done to begin ripening of the cervix prior to admission for induction of labor. Appropriate time out taken. The patient was  placed in the lithotomy position and the cervix brought into view with sterile speculum. A ring forcep was used to guide the 60F foley through the internal os of the cervix. Foley Balloon filled with 60cc of normal saline. Plug inserted into end of the foley. Foley placed on tension and taped to medial thigh.  NST:  EFM Baseline: 135 bpm  Toco: irregular, variable noted, with 15x15 accels  There were no signs of tachysystole or hypertonus. All equipment was removed and accounted for. The patient tolerated the procedure well. Patient requesting pain medication following insertion. 100 mcg fentanyl given. Patient at discharge rates pain 0/10, with + fetal movement.   Assessment and Plan:  Pregnancy: [id number] at [redacted]w[redacted]d  1. Encounter for induction of labor   2. [redacted] weeks gestation of pregnancy   3. NST (non-stress test) reactive     S/p Outpatient placement of foley balloon catheter for cervical ripening. Induction of labor scheduled for tomorrow at 0700 am. Reassuring FHR tracing with no concerns at present. Warning signs given to patient to include return to MAU for heavy vaginal bleeding, Rupture of membranes, painful uterine contractions q 5 mins or less, severe abdominal discomfort, decreased fetal movement.  No follow-ups on file.   Venia Carbon, NP 01/17/2020 10:47 PM

## 2020-01-17 NOTE — Discharge Instructions (Signed)

## 2020-01-18 ENCOUNTER — Inpatient Hospital Stay (HOSPITAL_COMMUNITY): Payer: Medicaid Other | Admitting: Anesthesiology

## 2020-01-18 ENCOUNTER — Other Ambulatory Visit: Payer: Self-pay

## 2020-01-18 ENCOUNTER — Inpatient Hospital Stay (HOSPITAL_COMMUNITY): Payer: Medicaid Other

## 2020-01-18 ENCOUNTER — Inpatient Hospital Stay (HOSPITAL_COMMUNITY)
Admission: RE | Admit: 2020-01-18 | Discharge: 2020-01-20 | DRG: 807 | Disposition: A | Discharge status: Home / Self Care | Payer: Medicaid Other | Attending: Obstetrics and Gynecology | Admitting: Obstetrics and Gynecology

## 2020-01-18 ENCOUNTER — Encounter (HOSPITAL_COMMUNITY): Payer: Self-pay | Admitting: Obstetrics and Gynecology

## 2020-01-18 DIAGNOSIS — Z3A39 39 weeks gestation of pregnancy: Secondary | ICD-10-CM

## 2020-01-18 DIAGNOSIS — O99824 Streptococcus B carrier state complicating childbirth: Secondary | ICD-10-CM | POA: Diagnosis present

## 2020-01-18 DIAGNOSIS — Z30017 Encounter for initial prescription of implantable subdermal contraceptive: Secondary | ICD-10-CM

## 2020-01-18 DIAGNOSIS — R8271 Bacteriuria: Secondary | ICD-10-CM | POA: Diagnosis present

## 2020-01-18 DIAGNOSIS — O409XX0 Polyhydramnios, unspecified trimester, not applicable or unspecified: Secondary | ICD-10-CM | POA: Diagnosis present

## 2020-01-18 DIAGNOSIS — O24420 Gestational diabetes mellitus in childbirth, diet controlled: Secondary | ICD-10-CM | POA: Diagnosis not present

## 2020-01-18 DIAGNOSIS — O3663X0 Maternal care for excessive fetal growth, third trimester, not applicable or unspecified: Secondary | ICD-10-CM | POA: Diagnosis present

## 2020-01-18 DIAGNOSIS — O09893 Supervision of other high risk pregnancies, third trimester: Secondary | ICD-10-CM | POA: Diagnosis not present

## 2020-01-18 DIAGNOSIS — O24419 Gestational diabetes mellitus in pregnancy, unspecified control: Secondary | ICD-10-CM | POA: Diagnosis present

## 2020-01-18 DIAGNOSIS — Z3A38 38 weeks gestation of pregnancy: Secondary | ICD-10-CM | POA: Diagnosis not present

## 2020-01-18 DIAGNOSIS — Z3490 Encounter for supervision of normal pregnancy, unspecified, unspecified trimester: Secondary | ICD-10-CM | POA: Diagnosis present

## 2020-01-18 DIAGNOSIS — Z3009 Encounter for other general counseling and advice on contraception: Secondary | ICD-10-CM

## 2020-01-18 DIAGNOSIS — N390 Urinary tract infection, site not specified: Secondary | ICD-10-CM | POA: Diagnosis present

## 2020-01-18 DIAGNOSIS — Z87891 Personal history of nicotine dependence: Secondary | ICD-10-CM

## 2020-01-18 DIAGNOSIS — O3660X0 Maternal care for excessive fetal growth, unspecified trimester, not applicable or unspecified: Secondary | ICD-10-CM | POA: Diagnosis present

## 2020-01-18 DIAGNOSIS — O99019 Anemia complicating pregnancy, unspecified trimester: Secondary | ICD-10-CM | POA: Diagnosis present

## 2020-01-18 DIAGNOSIS — O24425 Gestational diabetes mellitus in childbirth, controlled by oral hypoglycemic drugs: Secondary | ICD-10-CM | POA: Diagnosis present

## 2020-01-18 LAB — CBC
HCT: 32.6 % — ABNORMAL LOW (ref 36.0–46.0)
HCT: 31.8 % — ABNORMAL LOW (ref 36.0–46.0)
Hemoglobin: 9.9 g/dL — ABNORMAL LOW (ref 12.0–15.0)
Hemoglobin: 9.7 g/dL — ABNORMAL LOW (ref 12.0–15.0)
MCH: 24.1 pg — ABNORMAL LOW (ref 26.0–34.0)
MCH: 23.9 pg — ABNORMAL LOW (ref 26.0–34.0)
MCHC: 30.4 g/dL (ref 30.0–36.0)
MCHC: 30.5 g/dL (ref 30.0–36.0)
MCV: 79.3 fL — ABNORMAL LOW (ref 80.0–100.0)
MCV: 78.3 fL — ABNORMAL LOW (ref 80.0–100.0)
Platelets: 334 10*3/uL (ref 150–400)
Platelets: 307 10*3/uL (ref 150–400)
RBC: 4.11 MIL/uL (ref 3.87–5.11)
RBC: 4.06 MIL/uL (ref 3.87–5.11)
RDW: 14.8 % (ref 11.5–15.5)
RDW: 14.7 % (ref 11.5–15.5)
WBC: 10.3 10*3/uL (ref 4.0–10.5)
WBC: 12.3 10*3/uL — ABNORMAL HIGH (ref 4.0–10.5)
nRBC: 0 % (ref 0.0–0.2)
nRBC: 0 % (ref 0.0–0.2)

## 2020-01-18 LAB — COMPREHENSIVE METABOLIC PANEL
ALT: 14 U/L (ref 0–44)
AST: 22 U/L (ref 15–41)
Albumin: 2.6 g/dL — ABNORMAL LOW (ref 3.5–5.0)
Alkaline Phosphatase: 136 U/L — ABNORMAL HIGH (ref 38–126)
Anion gap: 10 (ref 5–15)
BUN: 7 mg/dL (ref 6–20)
CO2: 18 mmol/L — ABNORMAL LOW (ref 22–32)
Calcium: 9 mg/dL (ref 8.9–10.3)
Chloride: 106 mmol/L (ref 98–111)
Creatinine, Ser: 0.55 mg/dL (ref 0.44–1.00)
GFR calc Af Amer: >60 mL/min (ref >60)
GFR calc non Af Amer: >60 mL/min (ref >60)
Glucose, Bld: 96 mg/dL (ref 70–99)
Potassium: 4.2 mmol/L (ref 3.5–5.1)
Sodium: 134 mmol/L — ABNORMAL LOW (ref 135–145)
Total Bilirubin: 0.5 mg/dL (ref 0.3–1.2)
Total Protein: 5.8 g/dL — ABNORMAL LOW (ref 6.5–8.1)

## 2020-01-18 LAB — PROTEIN / CREATININE RATIO, URINE
Creatinine, Urine: 25.72 mg/dL
Total Protein, Urine: <6 mg/dL

## 2020-01-18 LAB — GLUCOSE, CAPILLARY
Glucose-Capillary: 86 mg/dL (ref 70–99)
Glucose-Capillary: 82 mg/dL (ref 70–99)
Glucose-Capillary: 84 mg/dL (ref 70–99)

## 2020-01-18 LAB — TYPE AND SCREEN
ABO/RH(D): O POS
Antibody Screen: NEGATIVE

## 2020-01-18 LAB — RPR: RPR Ser Ql: NONREACTIVE

## 2020-01-18 MED ORDER — TETANUS-DIPHTH-ACELL PERTUSSIS 5-2.5-18.5 LF-MCG/0.5 IM SUSP
0.5000 mL | Freq: Once | INTRAMUSCULAR | Status: AC
  Administered 2020-01-19: 0.5 mL via INTRAMUSCULAR
  Filled 2020-01-18: qty 0.5

## 2020-01-18 MED ORDER — WITCH HAZEL-GLYCERIN EX PADS: 1.0000 "application " | MEDICATED_PAD | CUTANEOUS | Status: DC | PRN

## 2020-01-18 MED ORDER — ACETAMINOPHEN 325 MG PO TABS: 650.0000 mg | ORAL_TABLET | ORAL | Status: DC | PRN

## 2020-01-18 MED ORDER — IBUPROFEN 600 MG PO TABS
600.0000 mg | ORAL_TABLET | Freq: Four times a day (QID) | ORAL | Status: DC
  Administered 2020-01-19 – 2020-01-20 (×7): 600 mg via ORAL
  Filled 2020-01-18 (×7): qty 1

## 2020-01-18 MED ORDER — SENNOSIDES-DOCUSATE SODIUM 8.6-50 MG PO TABS
2.0000 | ORAL_TABLET | ORAL | Status: DC
  Administered 2020-01-19 – 2020-01-20 (×2): 2 via ORAL
  Filled 2020-01-18 (×2): qty 2

## 2020-01-18 MED ORDER — ONDANSETRON HCL 4 MG/2ML IJ SOLN: 4.0000 mg | INTRAMUSCULAR | Status: DC | PRN

## 2020-01-18 MED ORDER — OXYCODONE HCL 5 MG PO TABS
5.0000 mg | ORAL_TABLET | ORAL | Status: DC | PRN
  Administered 2020-01-19 – 2020-01-20 (×5): 5 mg via ORAL
  Filled 2020-01-18 (×5): qty 1

## 2020-01-18 MED ORDER — SIMETHICONE 80 MG PO CHEW: 80.0000 mg | CHEWABLE_TABLET | ORAL | Status: DC | PRN

## 2020-01-18 MED ORDER — LACTATED RINGERS IV SOLN: INTRAVENOUS | Status: DC

## 2020-01-18 MED ORDER — PRENATAL MULTIVITAMIN CH
1.0000 | ORAL_TABLET | Freq: Every day | ORAL | Status: DC
  Administered 2020-01-19 – 2020-01-20 (×2): 1 via ORAL
  Filled 2020-01-18 (×2): qty 1

## 2020-01-18 MED ORDER — BENZOCAINE-MENTHOL 20-0.5 % EX AERO
1.0000 "application " | INHALATION_SPRAY | CUTANEOUS | Status: DC | PRN
  Filled 2020-01-18: qty 56

## 2020-01-18 MED ORDER — EPHEDRINE 5 MG/ML INJ: 10.0000 mg | INTRAVENOUS | Status: DC | PRN

## 2020-01-18 MED ORDER — ZOLPIDEM TARTRATE 5 MG PO TABS: 5.0000 mg | ORAL_TABLET | Freq: Every evening | ORAL | Status: DC | PRN

## 2020-01-18 MED ORDER — FERROUS SULFATE 325 (65 FE) MG PO TABS: 325.0000 mg | ORAL_TABLET | ORAL | Status: DC

## 2020-01-18 MED ORDER — PHENYLEPHRINE 40 MCG/ML (10ML) SYRINGE FOR IV PUSH (FOR BLOOD PRESSURE SUPPORT)
80.0000 ug | PREFILLED_SYRINGE | INTRAVENOUS | Status: DC | PRN
  Filled 2020-01-18: qty 10

## 2020-01-18 MED ORDER — LACTATED RINGERS IV SOLN: 500.0000 mL | INTRAVENOUS | Status: DC | PRN

## 2020-01-18 MED ORDER — LIDOCAINE HCL (PF) 1 % IJ SOLN
30.0000 mL | INTRAMUSCULAR | Status: AC | PRN
  Administered 2020-01-18: 30 mL via SUBCUTANEOUS

## 2020-01-18 MED ORDER — DIBUCAINE (PERIANAL) 1 % EX OINT: 1.0000 "application " | TOPICAL_OINTMENT | CUTANEOUS | Status: DC | PRN

## 2020-01-18 MED ORDER — OXYTOCIN-SODIUM CHLORIDE 30-0.9 UT/500ML-% IV SOLN
1.0000 m[IU]/min | INTRAVENOUS | Status: DC
  Administered 2020-01-18: 2 m[IU]/min via INTRAVENOUS
  Filled 2020-01-18: qty 500

## 2020-01-18 MED ORDER — SOD CITRATE-CITRIC ACID 500-334 MG/5ML PO SOLN: 30.0000 mL | ORAL | Status: DC | PRN

## 2020-01-18 MED ORDER — OXYCODONE HCL 5 MG PO TABS: 10.0000 mg | ORAL_TABLET | ORAL | Status: DC | PRN

## 2020-01-18 MED ORDER — DIPHENHYDRAMINE HCL 50 MG/ML IJ SOLN: 12.5000 mg | INTRAMUSCULAR | Status: DC | PRN

## 2020-01-18 MED ORDER — OXYTOCIN BOLUS FROM INFUSION
500.0000 mL | Freq: Once | INTRAVENOUS | Status: AC
  Administered 2020-01-18: 500 mL via INTRAVENOUS

## 2020-01-18 MED ORDER — SODIUM CHLORIDE 0.9 % IV SOLN
1.0000 g | INTRAVENOUS | Status: DC
  Administered 2020-01-18: 1 g via INTRAVENOUS
  Filled 2020-01-18 (×6): qty 1000

## 2020-01-18 MED ORDER — FENTANYL-BUPIVACAINE-NACL 0.5-0.125-0.9 MG/250ML-% EP SOLN
12.0000 mL/h | EPIDURAL | Status: DC | PRN
  Filled 2020-01-18: qty 250

## 2020-01-18 MED ORDER — LACTATED RINGERS IV SOLN
500.0000 mL | Freq: Once | INTRAVENOUS | Status: AC
  Administered 2020-01-18: 500 mL via INTRAVENOUS

## 2020-01-18 MED ORDER — ONDANSETRON HCL 4 MG/2ML IJ SOLN: 4.0000 mg | Freq: Four times a day (QID) | INTRAMUSCULAR | Status: DC | PRN

## 2020-01-18 MED ORDER — SODIUM CHLORIDE 0.9 % IV SOLN
2.0000 g | Freq: Once | INTRAVENOUS | Status: AC
  Administered 2020-01-18: 2 g via INTRAVENOUS
  Filled 2020-01-18: qty 2000

## 2020-01-18 MED ORDER — SODIUM CHLORIDE (PF) 0.9 % IJ SOLN
INTRAMUSCULAR | Status: DC | PRN
  Administered 2020-01-18: 12 mL/h via EPIDURAL

## 2020-01-18 MED ORDER — FERROUS SULFATE 325 (65 FE) MG PO TABS
325.0000 mg | ORAL_TABLET | ORAL | Status: DC
  Administered 2020-01-19: 325 mg via ORAL
  Filled 2020-01-18: qty 1

## 2020-01-18 MED ORDER — OXYTOCIN-SODIUM CHLORIDE 30-0.9 UT/500ML-% IV SOLN
2.5000 [IU]/h | INTRAVENOUS | Status: DC
  Administered 2020-01-18: 2.5 [IU]/h via INTRAVENOUS

## 2020-01-18 MED ORDER — LIDOCAINE HCL (PF) 1 % IJ SOLN
INTRAMUSCULAR | Status: DC | PRN
  Administered 2020-01-18: 5 mL via EPIDURAL

## 2020-01-18 MED ORDER — TERBUTALINE SULFATE 1 MG/ML IJ SOLN: 0.2500 mg | Freq: Once | INTRAMUSCULAR | Status: DC | PRN

## 2020-01-18 MED ORDER — COCONUT OIL OIL
1.0000 "application " | TOPICAL_OIL | Status: DC | PRN
  Administered 2020-01-19: 1 via TOPICAL

## 2020-01-18 MED ORDER — PHENYLEPHRINE 40 MCG/ML (10ML) SYRINGE FOR IV PUSH (FOR BLOOD PRESSURE SUPPORT): 80.0000 ug | PREFILLED_SYRINGE | INTRAVENOUS | Status: DC | PRN

## 2020-01-18 MED ORDER — DIPHENHYDRAMINE HCL 25 MG PO CAPS: 25.0000 mg | ORAL_CAPSULE | Freq: Four times a day (QID) | ORAL | Status: DC | PRN

## 2020-01-18 MED ORDER — FENTANYL CITRATE (PF) 100 MCG/2ML IJ SOLN
50.0000 ug | INTRAMUSCULAR | Status: DC | PRN
  Administered 2020-01-18 (×2): 100 ug via INTRAVENOUS
  Filled 2020-01-18 (×2): qty 2

## 2020-01-18 MED ORDER — ACETAMINOPHEN 325 MG PO TABS
650.0000 mg | ORAL_TABLET | ORAL | Status: DC | PRN
  Administered 2020-01-19 – 2020-01-20 (×2): 650 mg via ORAL
  Filled 2020-01-18 (×2): qty 2

## 2020-01-18 MED ORDER — ONDANSETRON HCL 4 MG PO TABS: 4.0000 mg | ORAL_TABLET | ORAL | Status: DC | PRN

## 2020-01-18 MED ORDER — LIDOCAINE HCL (PF) 1 % IJ SOLN
INTRAMUSCULAR | Status: AC
  Filled 2020-01-18: qty 30

## 2020-01-18 NOTE — Discharge Summary (Addendum)
Postpartum Discharge Summary     Patient Name: Tracy [name] DOB: Mar 02, 1986 MRN: [id number]  Date of admission: 01/18/2020 Delivery date:01/18/2020  Delivering provider: Merilyn Baba  Date of discharge: 01/20/2020  Admitting diagnosis: Encounter for induction of labor [Z34.90] Intrauterine pregnancy: [redacted]w[redacted]d    Secondary diagnosis:  Principal Problem:   Encounter for induction of labor Active Problems:   Urinary tract infection, site not specified   Gestational diabetes   Group B streptococcal bacteriuria   Polyhydramnios   LGA (large for gestational age) fetus affecting management of mother   Anemia in pregnancy   Unwanted fertility  Additional problems: None    Discharge diagnosis: Term Pregnancy Delivered and GDM A2 on Metformin                                             Post partum procedures:  Nexplanon Augmentation: AROM, Pitocin and OP Foley Complications: None  Hospital course: Induction of Labor With Vaginal Delivery   34y.o. yo [id number] admitted to the hospital 01/18/2020 for induction of labor.  Indication for induction: A2 DM.  Patient had an uncomplicated labor course as follows: Membrane Rupture Time/Date: 2:44 PM ,01/18/2020   Delivery Method:Vaginal, Spontaneous  Episiotomy: None  Lacerations:  1st degree  Details of delivery can be found in separate delivery note.  Patient had a routine postpartum course. Nexplanon placed on PPD#1. Patient is discharged home 01/20/20.  Newborn Data: Birth date:01/18/2020  Birth time:8:30 PM  Gender:Female  Living status:Living  Apgars:8 ,9  Weight:3799 g   Magnesium Sulfate received: No BMZ received: No Rhophylac:N/A MMR:N/A T-DaP:Given prenatally Flu: N/A Transfusion:No  Physical exam  Vitals:   01/19/20 0600 01/19/20 0925 01/19/20 1200 01/19/20 2102  BP: 120/80 128/89 102/67 (!) 132/95  Pulse: 90 91 91 (!) 101  Resp:  '18 16 17  ' Temp:  97.8 F (36.6 C) 98.1 F (36.7 C) 98.1  F (36.7 C)  TempSrc:  Oral Oral Oral  SpO2:  98% 100% 99%  Weight:      Height:       General: alert, cooperative and no distress Lochia: appropriate Uterine Fundus: firm Incision: N/A DVT Evaluation: No evidence of DVT seen on physical exam.  Labs: Lab Results  Component Value Date   WBC 12.3 (H) 01/18/2020   HGB 9.7 (L) 01/18/2020   HCT 31.8 (L) 01/18/2020   MCV 78.3 (L) 01/18/2020   PLT 307 01/18/2020   CMP Latest Ref Rng & Units 01/18/2020  Glucose 70 - 99 mg/dL 96  BUN 6 - 20 mg/dL 7  Creatinine 0.44 - 1.00 mg/dL 0.55  Sodium 135 - 145 mmol/L 134(L)  Potassium 3.5 - 5.1 mmol/L 4.2  Chloride 98 - 111 mmol/L 106  CO2 22 - 32 mmol/L 18(L)  Calcium 8.9 - 10.3 mg/dL 9.0  Total Protein 6.5 - 8.1 g/dL 5.8(L)  Total Bilirubin 0.3 - 1.2 mg/dL 0.5  Alkaline Phos 38 - 126 U/L 136(H)  AST 15 - 41 U/L 22  ALT 0 - 44 U/L 14   Edinburgh Score: Edinburgh Postnatal Depression Scale Screening Tool 01/19/2020  I have been able to laugh and see the funny side of things. 0  I have looked forward with enjoyment to things. 0  I have blamed myself unnecessarily when things went wrong. 0  I have been anxious or  worried for no good reason. 0  I have felt scared or panicky for no good reason. 0  Things have been getting on top of me. 0  I have been so unhappy that I have had difficulty sleeping. 0  I have felt sad or miserable. 0  I have been so unhappy that I have been crying. 0  The thought of harming myself has occurred to me. 0  Edinburgh Postnatal Depression Scale Total 0     After visit meds:  Allergies as of 01/20/2020   No Known Allergies      Medication List     STOP taking these medications    Accu-Chek Guide test strip Generic drug: glucose blood   Accu-Chek Guide w/Device Kit   Accu-Chek Softclix Lancets lancets   Blood Pressure Cuff Misc   metFORMIN 1000 MG tablet Commonly known as: GLUCOPHAGE   metFORMIN 500 MG tablet Commonly known as: GLUCOPHAGE    polyethylene glycol 17 g packet Commonly known as: MIRALAX / GLYCOLAX   terconazole 0.8 % vaginal cream Commonly known as: TERAZOL 3       TAKE these medications    ferrous sulfate 325 (65 FE) MG tablet Take 1 tablet (325 mg total) by mouth daily.   ibuprofen 600 MG tablet Commonly known as: ADVIL Take 1 tablet (600 mg total) by mouth every 6 (six) hours.   Vitafol Gummies 3.33-0.333-34.8 MG Chew Chew 1 each by mouth 3 (three) times daily.         Discharge home in stable condition Infant Feeding: Bottle and Breast Infant Disposition:home with mother Discharge instruction: per After Visit Summary and Postpartum booklet. Activity: Advance as tolerated. Pelvic rest for 6 weeks.  Diet: routine diet Future Appointments: Future Appointments  Date Time Provider [place]  03/03/2020  8:15 AM [facility] None  03/03/2020  8:30 AM Luvenia Redden, PA-C [facility] None   Follow up Visit:    Please schedule this patient for a In person postpartum visit in 4 weeks with the following provider: Any provider. Additional Postpartum F/U:2 hour GTT  High risk pregnancy complicated by: GDM Delivery mode:  Vaginal, Spontaneous  Anticipated Birth Control:  Nexplanon (placed prior to discharge)   EMILY Madelin Headings, MD PGY-2 Resident Family Medicine 01/20/2020, 8:31 AM  GME ATTESTATION:  I saw and evaluated the patient. I agree with the findings and the plan of care as documented in the resident's note.  Merilyn Baba, DO OB Fellow, [place] for [place] 01/20/2020 7:59 PM

## 2020-01-18 NOTE — H&P (Addendum)
LABOR AND DELIVERY ADMISSION HISTORY AND PHYSICAL NOTE  Tracy [name] is a 34 y.o. female [id number] with IUP at 70w0dby 7 wk UKoreapresenting for IOL 2/2 GDMA2, LGA.  She reports positive fetal movement. She denies leakage of fluid or vaginal bleeding.  [facility]:  - GDMA2 (metformin) - GBS bacteruria - Polyhydramnios (resolved, last AFI 19.2 on 6/18)  - LGA fetus  Past Medical History: Past Medical History:  Diagnosis Date   Depression    Diabetes mellitus without complication (HCC)    diet controlled   Gestational diabetes    Teeth problem    cracked molars, pt unsure if just right side or both sides    Past Surgical History: Past Surgical History:  Procedure Laterality Date   NO PAST SURGERIES      Obstetrical History: OB History     Gravida  6   Para  2   Term  2   Preterm  0   AB  3   Living  2      SAB  3   TAB  0   Ectopic  0   Multiple  0   Live Births  2           Social History: Social History   Socioeconomic History   Marital status: Single    Spouse name: Not on file   Number of children: Not on file   Years of education: Not on file   Highest education level: Not on file  Occupational History   Not on file  Tobacco Use   Smoking status: Former Smoker    Types: Cigarettes   Smokeless tobacco: Never Used  VScientific laboratory technicianUse: Never used  Substance and Sexual Activity   Alcohol use: No   Drug use: No   Sexual activity: Yes    Birth control/protection: None  Other Topics Concern   Not on file  Social History Narrative   Not on file   Social Determinants of Health   Financial Resource Strain:    Difficulty of Paying Living Expenses:   Food Insecurity:    Worried About RCharity fundraiserin the Last Year:    RArboriculturistin the Last Year:   Transportation Needs:    LFilm/video editor(Medical):    Lack of Transportation  (Non-Medical):   Physical Activity:    Days of Exercise per Week:    Minutes of Exercise per Session:   Stress:    Feeling of Stress :   Social Connections:    Frequency of Communication with Friends and Family:    Frequency of Social Gatherings with Friends and Family:    Attends Religious Services:    Active Member of Clubs or Organizations:    Attends CArchivistMeetings:    Marital Status:     Family History: No family history on file.  Allergies: No Known Allergies  Medications Prior to Admission  Medication Sig Dispense Refill Last Dose   Accu-Chek Softclix Lancets lancets 1 each by Other route 4 (four) times daily. 100 each 12 01/17/2020 at Unknown time   Blood Glucose Monitoring Suppl (ACCU-CHEK GUIDE) w/Device KIT 1 Device by Does not apply route 4 (four) times daily. 1 kit 0 01/17/2020 at Unknown time   ferrous sulfate 325 (65 FE) MG tablet Take 1 tablet (325 mg total) by mouth daily. 30 tablet 3 01/17/2020 at Unknown time  glucose blood (ACCU-CHEK GUIDE) test strip Use to check blood sugars four times a day was instructed 50 each 12 01/17/2020 at Unknown time   metFORMIN (GLUCOPHAGE) 1000 MG tablet Take 1 tablet (1,000 mg total) by mouth 2 (two) times daily with a meal. 60 tablet 1 01/17/2020 at Unknown time   Prenatal Vit-Fe Phos-FA-Omega (VITAFOL GUMMIES) 3.33-0.333-34.8 MG CHEW Chew 1 each by mouth 3 (three) times daily. 90 tablet 11 01/17/2020 at Unknown time   Blood Pressure Monitoring (BLOOD PRESSURE CUFF) MISC 1 Device by Does not apply route once a week. (Patient not taking: Reported on 10/15/2019) 1 each 0    metFORMIN (GLUCOPHAGE) 500 MG tablet Take 1 tablet (500 mg total) by mouth 2 (two) times daily with a meal. (Patient not taking: Reported on 01/13/2020) 60 tablet 5    polyethylene glycol (MIRALAX / GLYCOLAX) 17 g packet Take 17 g by mouth daily. (Patient not taking: Reported on 12/19/2019) 14 each 0    terconazole (TERAZOL 3) 0.8 % vaginal cream Place 1  applicator vaginally at bedtime. Apply nightly for three nights. (Patient not taking: Reported on 11/25/2019) 20 g 0      Review of Systems  All systems reviewed and negative except as stated in HPI  Physical Exam Blood pressure (!) 143/85, pulse 79, temperature 98.1 F (36.7 C), temperature source Oral, resp. rate 17, height 5' (1.524 m), weight 76.8 kg, last menstrual period 05/14/2019, unknown if currently breastfeeding. General appearance: alert, oriented, NAD Lungs: normal respiratory effort Heart: regular rate Abdomen: soft, non-tender; gravid, FH appropriate for GA Extremities: No calf swelling or tenderness Presentation: cephalic Fetal monitoring: 135/moderate/15x15 accels/no decels Uterine activity: Ctx q2-6 min    Prenatal labs: ABO, Rh: --/--/O POS (06/20 0757) Antibody: NEG (06/20 0757) Rubella: 1.34 (02/12 1041) RPR: Non Reactive (03/17 1109)  HBsAg: Negative (02/12 1041)  HIV: Non Reactive (03/17 1109)  GC/Chlamydia: Negative GBS: Negative/-- (06/09 1122)  2-hr GTT: GDM Genetic screening:  NIPS low risk female, negative Careers information officer [place]: LGA female.  Polyhydramnios, now resolved (last AFI 19.2 on 6/18) EFW: 4323 g (9 lb 8 oz), 99%ile (pelvis proven to 3595 g) Placenta: Posterior  Prenatal Transfer Tool  Maternal Diabetes: Yes:  Diabetes Type:  Insulin/Medication controlled Genetic Screening: Normal Maternal Ultrasounds/Referrals: Other: Polyhydramnios (resolved), LGA Fetal Ultrasounds or other Referrals:  Referred to Materal Fetal Medicine  Maternal Substance Abuse:  No Significant Maternal Medications:  Meds include: Other: Metformin Significant Maternal Lab Results: Group B Strep positive  Results for orders placed or performed during the hospital encounter of 01/18/20 (from the past 24 hour(s))  CBC   Collection Time: 01/18/20  7:57 AM  Result Value Ref Range   WBC 10.3 4.0 - 10.5 K/uL   RBC 4.11 3.87 - 5.11 MIL/uL   Hemoglobin 9.9 (L) 12.0 - 15.0 g/dL    HCT 32.6 (L) 36 - 46 %   MCV 79.3 (L) 80.0 - 100.0 fL   MCH 24.1 (L) 26.0 - 34.0 pg   MCHC 30.4 30.0 - 36.0 g/dL   RDW 14.8 11.5 - 15.5 %   Platelets 334 150 - 400 K/uL   nRBC 0.0 0.0 - 0.2 %  Comprehensive metabolic panel   Collection Time: 01/18/20  7:57 AM  Result Value Ref Range   Sodium 134 (L) 135 - 145 mmol/L   Potassium 4.2 3.5 - 5.1 mmol/L   Chloride 106 98 - 111 mmol/L   CO2 18 (L) 22 - 32 mmol/L   Glucose, Bld  96 70 - 99 mg/dL   BUN 7 6 - 20 mg/dL   Creatinine, Ser 0.55 0.44 - 1.00 mg/dL   Calcium 9.0 8.9 - 10.3 mg/dL   Total Protein 5.8 (L) 6.5 - 8.1 g/dL   Albumin 2.6 (L) 3.5 - 5.0 g/dL   AST 22 15 - 41 U/L   ALT 14 0 - 44 U/L   Alkaline Phosphatase 136 (H) 38 - 126 U/L   Total Bilirubin 0.5 0.3 - 1.2 mg/dL   GFR calc non Af Amer >60 >60 mL/min   GFR calc Af Amer >60 >60 mL/min   Anion gap 10 5 - 15  Type and screen   Collection Time: 01/18/20  7:57 AM  Result Value Ref Range   ABO/RH(D) O POS    Antibody Screen NEG    Sample Expiration      01/21/2020,2359 Performed at [facility] [address], [place], [place] [address]     Patient Active Problem List   Diagnosis Date Noted   Encounter for induction of labor 01/18/2020   Anemia in pregnancy 10/16/2019   LGA (large for gestational age) fetus affecting management of mother 10/15/2019   Polyhydramnios 10/09/2019   Supervision of other normal pregnancy, antepartum 09/12/2019   Group B streptococcal bacteriuria 08/26/2019   History of gestational diabetes 06/23/2016   Gestational diabetes 06/20/2016   Urinary tract infection, site not specified 12/05/2012   ALLERGIC RHINITIS 05/31/2007    Assessment: Tracy [name] is a 34 y.o. [id number] at 31w0dhere for IOL for GMorven  #Labor: S/p outpatient Foley bulb.  Now 5/70/-2.  Start pitocin.  #Pain: Epidural upon patient request.  Expect SVD.  #FWB: Cat I. #ID:  GBSuria, ampicillin #MOF: Both #MOC:Nexplanon #Circ:  No #LGA:  EFW  4323 g (9 lb 8 oz), 99%ile (pelvis proven to 3595 g) #GDMA2: CBG q2h while in active labor #Elevated BP: CMP with alk phos 136, otherwise unremarkable.  Platelets wnl.  Urine protein creatinine ratio pending.  No severe range BP.  Continue to monitor.   EMILY MMadelin Headings MD PGY-2 Resident Family Medicine 01/18/2020, 9:45 AM   Attestation of Supervision of Student:  I confirm that I have verified the information documented in the  resident 's note and that I have also personally reperformed the history, physical exam and all medical decision making activities.  I have verified that all services and findings are accurately documented in this student's note; and I agree with management and plan as outlined in the documentation. I have also made any necessary editorial changes.  Dr. ERip Harbourmade aware of latest EFW and weight of last baby.  RLaury Deep CWintonfor WDean Foods Company CPleasant HillsGroup 01/18/2020 4:02 PM

## 2020-01-18 NOTE — Progress Notes (Signed)
Foley bulb removed at 0804 on 01/18/20

## 2020-01-18 NOTE — Discharge Instructions (Signed)

## 2020-01-18 NOTE — Progress Notes (Signed)
Patient ID: Tracy [name], female   DOB: Apr 30, 1986, 34 y.o.   MRN: [id number]  Keilly Fatula is a 34 y.o. [id number] at 47w0dadmitted for IOL for [place]   Subjective: Feeling more intense contractions.  Desires AROM.   Objective: BP 130/84   Pulse 77   Temp 98.1 F (36.7 C) (Oral)   Resp 17   Ht 5' (1.524 m)   Wt 76.8 kg   LMP 05/14/2019   BMI 33.06 kg/m  No intake/output data recorded.  FHT:  FHR: 130 bpm, variability: moderate,  accelerations:  Present,  decelerations:  Absent UC:   regular, every 2-4 minutes  SVE:   Dilation: 7.5 Effacement (%): 90 Station: -2 Exam by:: Seniyah Esker, MD  Pitocin @ 10 mu/min  Labs: Lab Results  Component Value Date   WBC 10.3 01/18/2020   HGB 9.9 (L) 01/18/2020   HCT 32.6 (L) 01/18/2020   MCV 79.3 (L) 01/18/2020   PLT 334 01/18/2020    Assessment / Plan: 34y.o. [id number] 369w0dere for IOL for [place] Labor: S/p outpatient Foley bulb.  Progressing well on pitocin. AROM this check with clear fluid.   Fetal Wellbeing:  Category I Pain Control:  IV pain meds I/D:  GBSuria, ampicillin Anticipated MOD:  SVD  LGA:  EFW 4323 g (9 lb 8 oz), 99%ile (pelvis proven to 3595 g) GDMA2: CBG q2h while in active labor Elevated BP: CMP with alk phos 136, otherwise pre-E labs unremarkable.  No severe range BP.  Continue to monitor.   Diamonique Ruedas M Madelin HeadingsMD PGY-2 Resident Family Medicine 01/18/2020, 3:19 PM

## 2020-01-18 NOTE — Anesthesia Preprocedure Evaluation (Signed)
Anesthesia Evaluation  Patient identified by MRN, date of birth, ID band Patient awake    Reviewed: Allergy & Precautions, NPO status , Patient's Chart, lab work & pertinent test results  Airway Mallampati: II  TM Distance: >3 FB Neck ROM: Full    Dental no notable dental hx. (+) Poor Dentition   Pulmonary neg pulmonary ROS, former smoker,    Pulmonary exam normal breath sounds clear to auscultation       Cardiovascular hypertension, negative cardio ROS Normal cardiovascular exam Rhythm:Regular Rate:Normal     Neuro/Psych negative neurological ROS  negative psych ROS   GI/Hepatic negative GI ROS, Neg liver ROS,   Endo/Other  diabetes, Gestational  Renal/GU negative Renal ROS     Musculoskeletal negative musculoskeletal ROS (+)   Abdominal   Peds  Hematology Lab Results      Component                Value               Date                      WBC                      12.3 (H)            01/18/2020                HGB                      9.7 (L)             01/18/2020                HCT                      31.8 (L)            01/18/2020                MCV                      78.3 (L)            01/18/2020                PLT                      307                 01/18/2020              Anesthesia Other Findings   Reproductive/Obstetrics (+) Pregnancy                             Anesthesia Physical Anesthesia Plan  ASA: III  Anesthesia Plan: Epidural   Post-op Pain Management:    Induction:   PONV Risk Score and Plan:   Airway Management Planned:   Additional Equipment:   Intra-op Plan:   Post-operative Plan:   Informed Consent: I have reviewed the patients History and Physical, chart, labs and discussed the procedure including the risks, benefits and alternatives for the proposed anesthesia with the patient or authorized representative who has indicated his/her  understanding and acceptance.       Plan Discussed with:   Anesthesia Plan Comments: (39 wk G6p2 for LEA)  Anesthesia Quick Evaluation  

## 2020-01-18 NOTE — Anesthesia Procedure Notes (Signed)
Epidural Patient location during procedure: OB Start time: 01/18/2020 4:47 PM End time: 01/18/2020 5:05 PM  Staffing Anesthesiologist: Trevor Iha, MD Performed: anesthesiologist   Preanesthetic Checklist Completed: patient identified, IV checked, site marked, risks and benefits discussed, surgical consent, monitors and equipment checked, pre-op evaluation and timeout performed  Epidural Patient position: sitting Prep: DuraPrep and site prepped and draped Patient monitoring: continuous pulse ox and blood pressure Approach: midline Location: L3-L4 Injection technique: LOR air  Needle:  Needle type: Tuohy  Needle gauge: 17 G Needle length: 9 cm and 9 Needle insertion depth: 5 cm cm Catheter type: closed end flexible Catheter size: 19 Gauge Catheter at skin depth: 10 cm Test dose: negative  Assessment Events: blood not aspirated, injection not painful, no injection resistance, no paresthesia and negative IV test  Additional Notes Patient identified. Risks/Benefits/Options discussed with patient including but not limited to bleeding, infection, nerve damage, paralysis, failed block, incomplete pain control, headache, blood pressure changes, nausea, vomiting, reactions to medication both or allergic, itching and postpartum back pain. Confirmed with bedside nurse the patient's most recent platelet count. Confirmed with patient that they are not currently taking any anticoagulation, have any bleeding history or any family history of bleeding disorders. Patient expressed understanding and wished to proceed. All questions were answered. Sterile technique was used throughout the entire procedure. Please see nursing notes for vital signs. Test dose was given through epidural needle and negative prior to continuing to dose epidural or start infusion. Warning signs of high block given to the patient including shortness of breath, tingling/numbness in hands, complete motor block, or any  concerning symptoms with instructions to call for help. Patient was given instructions on fall risk and not to get out of bed. All questions and concerns addressed with instructions to call with any issues. 1 Attempt (S) . Patient tolerated procedure well.

## 2020-01-19 DIAGNOSIS — Z30017 Encounter for initial prescription of implantable subdermal contraceptive: Secondary | ICD-10-CM

## 2020-01-19 DIAGNOSIS — Z3009 Encounter for other general counseling and advice on contraception: Secondary | ICD-10-CM

## 2020-01-19 LAB — GLUCOSE, CAPILLARY: Glucose-Capillary: 108 mg/dL — ABNORMAL HIGH (ref 70–99)

## 2020-01-19 MED ORDER — ETONOGESTREL 68 MG ~~LOC~~ IMPL
68.0000 mg | DRUG_IMPLANT | Freq: Once | SUBCUTANEOUS | Status: AC
  Administered 2020-01-19: 68 mg via SUBCUTANEOUS
  Filled 2020-01-19: qty 1

## 2020-01-19 MED ORDER — LIDOCAINE HCL 1 % IJ SOLN
0.0000 mL | Freq: Once | INTRAMUSCULAR | Status: AC | PRN
  Administered 2020-01-19: 20 mL via INTRADERMAL
  Filled 2020-01-19: qty 20

## 2020-01-19 NOTE — Progress Notes (Signed)
Post Partum Day 1 s/p SVD. Subjective: no complaints, up ad lib, voiding, tolerating PO and + flatus  Objective: Blood pressure 120/80, pulse 90, temperature 98.4 F (36.9 C), temperature source Oral, resp. rate 20, height 5' (1.524 m), weight 76.8 kg, last menstrual period 05/14/2019, SpO2 98 %, unknown if currently breastfeeding.  Physical Exam:  General: alert, cooperative and no distress Lochia: appropriate Uterine Fundus: firm DVT Evaluation: No evidence of DVT seen on physical exam.  Recent Labs    01/18/20 0757 01/18/20 1627  HGB 9.9* 9.7*  HCT 32.6* 31.8*    Assessment/Plan: Breastfeeding and Contraception Nexplanon prior to discharge.  Possible discharge later today if baby okay to leave.  Nurse to call for orders.   LOS: 1 day   Jolan Upchurch Genene Churn, MD PGY-2 Resident Family Medicine 01/19/2020, 8:11 AM

## 2020-01-19 NOTE — Progress Notes (Signed)
[name] was referred for history of depression. * Referral screened out by Clinical Social Worker because none of the following criteria appear to apply: ~ History of anxiety/depression during this pregnancy, or of post-partum depression following prior delivery. Per prenatal records review, no concerns of depression noted. ~ Diagnosis of anxiety and/or depression within last 3 years. Per chart review, [name]'s depression dates back to 2012.  OR * [name]'s symptoms currently being treated with medication and/or therapy.  Please contact the Clinical Social Worker if needs arise, by [name] request, or if [name] scores greater than 9/yes to question 10 on Edinburgh Postpartum Depression Screen.  Tracy Jeff, LCSW Clinical Social Worker [facility] Cell#: [phone] 

## 2020-01-19 NOTE — Procedures (Signed)
GYNECOLOGY PROCEDURE NOTE  Tracy [name] is a 34 y.o. [phone] requesting Nexplanon insertion. No gynecologic concerns.  Nexplanon Insertion Procedure Patient identified, informed consent performed, consent signed. Patient does understand that irregular bleeding is a very common side effect of this medication. She was advised to have backup contraception for one week after placement. Appropriate time out taken. Patient's left arm was prepped and draped in the usual sterile fashion. The insertion area was measured and marked. Patient was prepped with alcohol swab and then injected with 3 ml of 1% lidocaine. The area was then prepped with betadine. Nexplanon removed from packaging and device confirmed present within needle, then inserted full length of needle and withdrawn per handbook instructions. Nexplanon was able to palpated in the patient's arm; patient palpated the insert herself. There was minimal blood loss. Patient insertion site covered with steri strip, guaze, and a pressure bandage to reduce any bruising. The patient tolerated the procedure well and was given post procedure instructions.

## 2020-01-19 NOTE — Progress Notes (Signed)
Patient ID: Tracy [name], female   DOB: [birth year], 34 y.o.   MRN: [id number] POSTPARTUM PROGRESS NOTE  Subjective: Tracy [name] is a 34 y.o. [id number] s/p SVD at [redacted]w[redacted]d.  She reports she doing well. No acute events overnight. She denies any problems with ambulating, voiding or po intake. Denies nausea or vomiting. She has passed flatus and had a BM. Pain is well controlled.  Lochia is minimal.  Objective: Blood pressure 128/89, pulse 91, temperature 97.8 F (36.6 C), temperature source Oral, resp. rate 18, height 5' (1.524 m), weight 76.8 kg, last menstrual period 05/14/2019, SpO2 98 %, unknown if currently breastfeeding.  Physical Exam:  General: alert, cooperative and no distress Chest: no respiratory distress Abdomen: soft, non-tender  Uterine Fundus: firm, appropriately tender Extremities: No calf swelling or tenderness  no edema  Recent Labs    01/18/20 0757 01/18/20 1627  HGB 9.9* 9.7*  HCT 32.6* 31.8*    Assessment/Plan: Tracy [name] is a 34 y.o. [id number] s/p SVD at [redacted]w[redacted]d for IOL for GDM.  Routine Postpartum Care: Doing well, pain well-controlled.  -- Continue routine care, lactation support  -- Contraception: Nexplanon (plan to place today) -- Feeding: breast and bottle  Dispo: Plan for discharge tomorrow.  Edd Arbour, CNM [facility] [employer]

## 2020-01-19 NOTE — Lactation Note (Signed)
This note was copied from a baby's chart. Lactation Consultation Note Mom doesn't need Lactation services.  Patient Name: Tracy [name] Date: 01/19/2020     Maternal Data    Feeding    LATCH Score                   Interventions    Lactation Tools Discussed/Used     Consult Status      Charyl Dancer 01/19/2020, 4:31 AM

## 2020-01-19 NOTE — Anesthesia Postprocedure Evaluation (Signed)
Anesthesia Post Note  Patient: Tracy [name]  Procedure(s) Performed: AN AD HOC LABOR EPIDURAL     Patient location during evaluation: Mother Baby Anesthesia Type: Epidural Level of consciousness: awake and alert Pain management: pain level controlled Vital Signs Assessment: post-procedure vital signs reviewed and stable Respiratory status: spontaneous breathing, nonlabored ventilation and respiratory function stable Cardiovascular status: stable Postop Assessment: no headache, no backache and epidural receding Anesthetic complications: no   No complications documented.  Last Vitals:  Vitals:   01/19/20 0300 01/19/20 0600  BP: (!) 131/94 120/80  Pulse: (!) 103 90  Resp: 20   Temp: 36.9 C   SpO2: 98%     Last Pain:  Vitals:   01/19/20 0400  TempSrc:   PainSc: 0-No pain   Pain Goal:                   Tracy [name]

## 2020-01-20 LAB — GLUCOSE, CAPILLARY: Glucose-Capillary: 114 mg/dL — ABNORMAL HIGH (ref 70–99)

## 2020-01-20 MED ORDER — IBUPROFEN 600 MG PO TABS: 600.0000 mg | ORAL_TABLET | Freq: Four times a day (QID) | ORAL | 0 refills | Status: DC

## 2020-03-03 ENCOUNTER — Ambulatory Visit (INDEPENDENT_AMBULATORY_CARE_PROVIDER_SITE_OTHER): Payer: Medicaid Other | Admitting: Medical

## 2020-03-03 ENCOUNTER — Encounter: Payer: Self-pay | Admitting: Medical

## 2020-03-03 ENCOUNTER — Other Ambulatory Visit: Payer: Medicaid Other

## 2020-03-03 ENCOUNTER — Other Ambulatory Visit: Payer: Self-pay

## 2020-03-03 VITALS — BP 140/93 | HR 73 | Wt 154.0 lb

## 2020-03-03 DIAGNOSIS — Z8632 Personal history of gestational diabetes: Secondary | ICD-10-CM

## 2020-03-03 MED ORDER — IBUPROFEN 600 MG PO TABS: 600.0000 mg | ORAL_TABLET | Freq: Four times a day (QID) | ORAL | 0 refills | Status: DC

## 2020-03-03 NOTE — Patient Instructions (Addendum)
Plan de alimentacin con bajo contenido de sodio Low-Sodium Eating Plan El sodio, que es uno de los elementos que constituyen la sal, ayuda a mantener un equilibrio saludable de los fluidos corporales. Mucha cantidad de sodio puede aumentar la presin arterial y hacer que el organismo retenga lquidos y residuos. El mdico o el nutricionista podran recomendarle que siga este plan si tiene presin arterial alta (hipertensin), enfermedad renal, enfermedad heptica o insuficiencia cardaca. El consumo de una menor cantidad de sodio puede ayudar a disminuir la presin arterial, reducir la hinchazn y proteger el corazn, el hgado y los riones. Consejos para seguir este plan [place] generales  La mayora de las personas que sigan este plan deben limitar la ingesta de sodio a entre 1500y2000mg(miligramos) por da. Lectura de las etiquetas de los alimentos   La etiqueta de informacin nutricional indica la cantidad de sodio en una porcin de alimento. Si come ms de una porcin, debe multiplicar la cantidad indicada de sodio por la cantidad de porciones.  Elija alimentos con menos de 140mg de sodio por porcin.  Evite consumir alimentos que tengan 300mg de sodio o ms por porcin. De compras  Busque productos con bajo contenido de sodio, en cuyas etiquetas suele indicarse "bajo contenido de sodio" o "sin agregado de sal".  Siempre controle el contenido de sodio, incluso si en la etiqueta de los alimentos dice "sin sal" o "sin agregado de sal".  Compre alimentos frescos. ? Evite los alimentos enlatados y comidas precocidas o congeladas. ? Evite las carnes enlatadas, curadas o procesadas.  Compre panes que tengan menos de 80mg de sodio por rebanada. Coccin  Consuma ms comida casera y menos de restaurante, de bufs y comida rpida.  Evite agregar sal cuando cocine. Use hierbas o aderezos sin sal, en lugar de sal de mesa o sal marina. Consulte al mdico o farmacutico antes de usar  sustitutos de la sal.  Cocine con aceites de origen vegetal, como el de canola, girasol u oliva. Planificacin de las comidas  Cuando coma en un restaurante, pida que preparen su comida con menos sal o, en lo posible, sin nada de sal.  Evite consumir alimentos que contengan GMS (glutamato monosdico). El GMS suele agregarse a la comida china, al consom y a algunos alimentos enlatados. Qu alimentos se recomiendan? Los alimentos enumerados a continuacin no constituyen una lista completa. Hable con el nutricionista sobre las mejores opciones alimenticias para usted. Cereales Cereales con bajo contenido de sodio, como avena, arroz y trigo inflados, y trigo triturado. Galletas con bajo contenido de sodio. Arroz sin sal. Pastas sin sal. Pan con bajo contenido de sodio. Panes y pastas integrales. Verduras Verduras frescas o congeladas. Verduras enlatadas "sin agregado de sal". Salsa de tomate y pastas "sin agregado de sal". Jugos de tomate y verduras con bajo contenido de sodio o reducidos en sodio. Frutas Frutas frescas, congeladas o enlatadas. Jugo de frutas. Carnes y otros alimentos proteicos Carne de vaca o ave, pescado y mariscos frescos o congelados (sin agregado de sal). Atn y salmn enlatado con bajo contenido de sodio. Frutos secos sin sal. Lentejas, frijoles y guisantes secos, sin agregado de sal. Frijoles enlatados sin sal. Huevos. Mantequillas de frutos secos sin sal. Lcteos Leche. Leche de soja. Quesos que, por naturaleza, tengan bajo contenido de sodio, por ejemplo, la ricota, la mozzarella fresca o el queso suizo; o quesos con bajo contenido de sodio o reducidos en sodio. Queso crema. Yogur. Grasas y aceites Mantequilla sin sal. Margarina sin sal ni grasas trans.   trans. Aceites vegetales, como de canola u oliva. Condimentos y otros alimentos Hierbas y especias frescas y secas. Aderezos sin sal. Mostaza y ktchup con bajo contenido de [place]. Aderezos para ensalada sin sodio.  [place] light sin sodio. Rbano picante fresco o refrigerado. Jugo de limn. [place]. [place]opas caseras o con contenido de sodio bajo o reducido. Palomitas de maz y pretzels sin sal. Papas fritas sin sal o con bajo contenido de sal. Qu alimentos no se recomiendan? Los alimentos enumerados a continuacin no constituyen Water quality scientist. Hable con el nutricionista sobre las mejores opciones alimenticias para usted. Cereales Cereales instantneos para comer caliente. Mezclas para bizcochos, panqueques y rellenos de pan. Crutones. Mezclas para pastas o arroz con condimento. Envases comerciales de sopa de fideos. Macarrones con queso envasados o congelados. Galletas saladas comunes. Harina leudante. Verduras [place], verduras en escabeche y salsas. Aceitunas. Papas fritas. Anillos de cebollas. Verduras enlatadas regulares (que no sean con bajo contenido de sodio o reducidas en sodio). Pasta y salsa de tomates enlatadas regulares (que no sean con bajo contenido de sodio o reducidas en sodio). Jugos de tomate y verduras regulares (que no sean con bajo contenido de sodio o reducidos en sodio). Verduras Hydrologist. Carnes y otros alimentos proteicos Carne de vaca o pescado que est [place], [place], [place], condimentada o en escabeche. [place], jamn, salchichas, hotdogs, carne en conserva, carne ahumada, embutidos envasados, carne de cerdo salada, cecina, arenques en escabeche, anchoas, atn enlatado comn, sardinas, nueces saladas. Lcteos Quesos para untar y quesos procesados. Requesn. Queso azul. Queso feta. Queso en hebras. Queso cottage comn. Suero de [place]. Leche enlatada. Grasas y aceites Mantequilla con sal. Margarina comn. Mantequilla clarificada. Grasa de panceta. Condimentos y otros alimentos Sal de cebolla, sal de ajo, sal condimentada, sal de mesa y sal marina. Salsas en lata y envasadas. Salsa [place]. Salsa trtara. Salsa barbacoa. Salsa teriyaki. Salsa de soja, incluso la  que tiene contenido reducido de [place]. Salsa de carne. Salsa de pescado. Salsa de [place]. Salsa rosada. Rbano picante envasado. [place] y [employer]. Saborizantes y tiernizantes para carne. Caldo en cubitos. Salsa picante y salsa tabasco. Escabeches envasados o ya preparados. Aderezos para tacos prefabricados o envasados. Salsas. Aderezos comunes para ensalada. Salsa. Nachos y papas fritas envasadas. Maz inflado y frituras de maz. Palomitas de maz y pretzels con sal. Sopas enlatadas o en polvo. Pizza. Pasteles y entradas congeladas. Resumen  El consumo de una menor cantidad de sodio puede ayudar a [employer] presin arterial, reducir la hinchazn y Physicist, medical, el hgado y los riones.  La [employer] de las personas que sigan este plan deben limitar la ingesta de sodio a entre 1500y2000mg (miligramos) por Futures trader.  Los [employer], envasados y [address] tienen alto contenido de [place]. La pizza, la comida rpida y la comida de los restaurantes tambin contienen mucho sodio. Tambin aporta sodio al agregar sal a las comidas.  Intente cocinar en su hogar, coma ms frutas y verduras frescas, y coma menos comidas rpidas y alimentos enlatados, procesados o preparados. Esta informacin no tiene Theme park manager el consejo del mdico. Asegrese de hacerle al mdico cualquier pregunta que tenga. Document Revised: 11/23/2016 Document Reviewed: 11/23/2016 Elsevier Patient Education  2020 [employer].

## 2020-03-03 NOTE — Progress Notes (Signed)
Post Partum Visit Note  Tracy [name] is a 34 y.o. [phone] female who presents for a postpartum visit. She is 6 weeks postpartum following a normal spontaneous vaginal delivery.  I have fully reviewed the prenatal and intrapartum course. The delivery was at [redacted]w[redacted]d gestational weeks.  Anesthesia: epidural. Postpartum course has been unremarkable2. Baby is doing well. Baby is feeding by breast. Bleeding no bleeding. Bowel function is normal. Bladder function is normal. Patient is not sexually active. Contraception method is Nexplanon. Postpartum depression screening: negative.  Patient reports frequent headaches since delivery. She states they are most day and relieved with Ibuprofen. She denies visual changes, RUQ pain or LE edema. She denies any history of HTN.      Edinburgh Postnatal Depression Scale - 03/03/20 0833      Edinburgh Postnatal Depression Scale:  In the Past 7 Days   I have been able to laugh and see the funny side of things. 0    I have looked forward with enjoyment to things. 0    I have blamed myself unnecessarily when things went wrong. 0    I have been anxious or worried for no good reason. 0    I have felt scared or panicky for no good reason. 0    Things have been getting on top of me. 0    I have been so unhappy that I have had difficulty sleeping. 0    I have felt sad or miserable. 0    I have been so unhappy that I have been crying. 0    The thought of harming myself has occurred to me. 0    Edinburgh Postnatal Depression Scale Total 0            The following portions of the patient's history were reviewed and updated as appropriate: allergies, current medications, past family history, past medical history, past social history, past surgical history and problem list.  Review of Systems Pertinent items are noted in HPI.    Objective:  Blood pressure (!) 132/92, pulse 73, weight 154 lb (69.9 kg), unknown if currently breastfeeding.  General:   alert and cooperative   Breasts:  not performed  Lungs: clear to auscultation bilaterally  Heart:  regular rate and rhythm, S1, S2 normal, no murmur, click, rub or gallop  Abdomen: soft, mild tenderness in the LUQ   Vulva:  not evaluated  Vagina: not evaluated  Cervix:  not evaluated  Corpus: not examined  Adnexa:  not evaluated  Rectal Exam: Not performed.        Assessment:    Normal postpartum exam.  H/O GDM Headache   Plan:   Essential components of care per ACOG recommendations:  1.  Mood and well being: Patient with negative depression screening today. Reviewed local resources for support.  - Patient does not use tobacco.  - hx of drug use? No    2. Infant care and feeding:  -Patient currently breastmilk feeding? Yes - not going back to work  -Social determinants of health (SDOH) reviewed in [place]. The following needs were identified- diapers  3. Sexuality, contraception and birth spacing - Patient does not want a pregnancy in the next year.  Desired family size is 4 children.  - Reviewed forms of contraception in tiered fashion. Patient had Nexplanon placed PP - Discussed birth spacing of 18 months  4. Sleep and fatigue -Encouraged family/partner/community support of 4 hrs of uninterrupted sleep to help with mood and fatigue  5. Physical Recovery  - Discussed patients delivery and complications - Patient had a 1st degree laceration, perineal healing reviewed. Patient expressed understanding - Patient has urinary incontinence? Yes Patient was offered to pelvic floor PT and declined at this time - Patient is safe to resume physical and sexual activity  6.  Health Maintenance - Last pap smear done 09/12/2019 and was normal with negative HPV.  7. History of GDM - 2 hour today   8. Headaches - Rx Ibuprofen refill sent to patient's pharmacy of choice - Initial BP 132/92, recheck 140/93 - Discussed patient with Dr. Shawnie Pons. Recommends low salt diet and recheck in 2  weeks.   Vonzella Nipple, PA-C Center for [employer], [facility]

## 2020-03-04 LAB — GLUCOSE TOLERANCE, 2 HOURS
Glucose, 2 hour: 158 mg/dL — ABNORMAL HIGH (ref 65–139)
Glucose, GTT - Fasting: 100 mg/dL — ABNORMAL HIGH (ref 65–99)

## 2020-03-05 ENCOUNTER — Other Ambulatory Visit: Payer: Self-pay

## 2020-03-05 DIAGNOSIS — O24419 Gestational diabetes mellitus in pregnancy, unspecified control: Secondary | ICD-10-CM

## 2020-03-05 MED ORDER — ACCU-CHEK GUIDE VI STRP
ORAL_STRIP | 12 refills | Status: DC
Start: 2020-03-05 — End: 2021-06-16

## 2020-03-05 MED ORDER — ACCU-CHEK GUIDE W/DEVICE KIT
1.0000 | PACK | Freq: Four times a day (QID) | 0 refills | Status: DC
Start: 2020-03-05 — End: 2021-06-16

## 2020-03-05 MED ORDER — ACCU-CHEK SOFTCLIX LANCETS MISC: 1.0000 | Freq: Four times a day (QID) | 12 refills | Status: DC

## 2020-03-05 NOTE — Progress Notes (Signed)
Rx supplies sent as advised by provider for GDM .

## 2020-03-10 ENCOUNTER — Ambulatory Visit: Payer: Medicaid Other

## 2020-03-17 ENCOUNTER — Ambulatory Visit: Payer: Medicaid Other

## 2021-06-16 ENCOUNTER — Ambulatory Visit (INDEPENDENT_AMBULATORY_CARE_PROVIDER_SITE_OTHER): Payer: Medicaid Other | Admitting: Obstetrics and Gynecology

## 2021-06-16 ENCOUNTER — Encounter: Payer: Self-pay | Admitting: Obstetrics and Gynecology

## 2021-06-16 ENCOUNTER — Other Ambulatory Visit: Payer: Self-pay

## 2021-06-16 DIAGNOSIS — Z309 Encounter for contraceptive management, unspecified: Secondary | ICD-10-CM | POA: Insufficient documentation

## 2021-06-16 DIAGNOSIS — Z3046 Encounter for surveillance of implantable subdermal contraceptive: Secondary | ICD-10-CM | POA: Diagnosis not present

## 2021-06-16 DIAGNOSIS — Z30011 Encounter for initial prescription of contraceptive pills: Secondary | ICD-10-CM | POA: Diagnosis not present

## 2021-06-16 HISTORY — DX: Encounter for contraceptive management, unspecified: Z30.9

## 2021-06-16 HISTORY — DX: Encounter for surveillance of implantable subdermal contraceptive: Z30.46

## 2021-06-16 MED ORDER — DESOGESTREL-ETHINYL ESTRADIOL 0.15-30 MG-MCG PO TABS: 1.0000 | ORAL_TABLET | Freq: Every day | ORAL | 11 refills | Status: DC

## 2021-06-16 NOTE — Addendum Note (Signed)
Addended by: Charlsie Quest B on: 06/16/2021 03:40 PM   Modules accepted: Orders

## 2021-06-16 NOTE — Patient Instructions (Signed)
Nexplanon Instructions After Removal  Keep bandage clean and dry for 24 hours  May use ice/Tylenol/Ibuprofen for soreness or pain  If you develop fever, drainage or increased warmth from incision site-contact office immediately   

## 2021-06-16 NOTE — Progress Notes (Signed)
Here for Nexplanon removal. Reports bleeding/ spotting for one month and abdominal pain. Reports pain in left arm since placement. After removal, wants to discuss OCPs.

## 2021-06-16 NOTE — Progress Notes (Signed)
Carnita Golob Bradly Bienenstock is a 35 y.o. [phone] here for Nexplanon removal    Nexplanon Removal Patient identified, informed consent performed, consent signed.   Appropriate time out taken. Nexplanon site identified.  Area prepped in usual sterile fashon. One ml of 1% lidocaine was used to anesthetize the area at the distal end of the implant. A small stab incision was made right beside the implant on the distal portion.  The Nexplanon rod was grasped using hemostats and removed without difficulty.  There was minimal blood loss. There were no complications.  3 ml of 1% lidocaine was injected around the incision for post-procedure analgesia.  Steri-strips were applied over the small incision.  A pressure bandage was applied to reduce any bruising.  The patient tolerated the procedure well and was given post procedure instructions.  Patient is planning to use OCP's for contraception/attempt conception.   A/P Nexplanon removal        Contraception management  U/R/B and back up method reviewed with pt.   Hermina Staggers, MD, FACOG Attending Obstetrician & Gynecologist Center for [facility], [facility]

## 2021-06-17 ENCOUNTER — Other Ambulatory Visit: Payer: Self-pay

## 2021-06-17 DIAGNOSIS — Z30011 Encounter for initial prescription of contraceptive pills: Secondary | ICD-10-CM

## 2021-06-17 MED ORDER — DESOGESTREL-ETHINYL ESTRADIOL 0.15-30 MG-MCG PO TABS: 1.0000 | ORAL_TABLET | Freq: Every day | ORAL | 11 refills | Status: DC

## 2021-06-17 NOTE — Progress Notes (Signed)
Pharmacy states they did not receive bc script. Rx sent again.

## 2021-06-26 ENCOUNTER — Emergency Department (HOSPITAL_COMMUNITY): Payer: Medicaid Other

## 2021-06-26 ENCOUNTER — Other Ambulatory Visit: Payer: Self-pay

## 2021-06-26 ENCOUNTER — Emergency Department (HOSPITAL_COMMUNITY)
Admission: EM | Admit: 2021-06-26 | Discharge: 2021-06-26 | Disposition: A | Discharge status: Home / Self Care | Payer: Medicaid Other | Attending: Emergency Medicine | Admitting: Emergency Medicine

## 2021-06-26 ENCOUNTER — Encounter (HOSPITAL_COMMUNITY): Payer: Self-pay | Admitting: Emergency Medicine

## 2021-06-26 DIAGNOSIS — E119 Type 2 diabetes mellitus without complications: Secondary | ICD-10-CM | POA: Diagnosis not present

## 2021-06-26 DIAGNOSIS — J069 Acute upper respiratory infection, unspecified: Secondary | ICD-10-CM | POA: Diagnosis not present

## 2021-06-26 DIAGNOSIS — Z20822 Contact with and (suspected) exposure to covid-19: Secondary | ICD-10-CM | POA: Insufficient documentation

## 2021-06-26 DIAGNOSIS — Z87891 Personal history of nicotine dependence: Secondary | ICD-10-CM | POA: Insufficient documentation

## 2021-06-26 DIAGNOSIS — R000 Tachycardia, unspecified: Secondary | ICD-10-CM | POA: Insufficient documentation

## 2021-06-26 DIAGNOSIS — R059 Cough, unspecified: Secondary | ICD-10-CM | POA: Diagnosis present

## 2021-06-26 LAB — RESP PANEL BY RT-PCR (FLU A&B, COVID) ARPGX2
Influenza A by PCR: NEGATIVE
Influenza B by PCR: NEGATIVE
SARS Coronavirus 2 by RT PCR: NEGATIVE

## 2021-06-26 NOTE — ED Provider Notes (Signed)
[facility] EMERGENCY DEPARTMENT Provider Note   CSN: [id number] Arrival date & time: 06/26/21  1129     History Chief Complaint  Patient presents with   Cough    Tracy [name] is a 35 y.o. female presenting to the ED with fevers, cough, congestion, headache.  The patient reports that 2 of her daughters were tested positive for RSV earlier this week.  The patient herself has been having symptoms for 4 days.  She reports a frontal headache, tearing eyes, cough, congestion, sore throat from coughing, nausea.  She denies any medical issues including asthma.  She says she is a former smoker but quit.  HPI     Past Medical History:  Diagnosis Date   Depression    Diabetes mellitus without complication (HCC)    diet controlled   Gestational diabetes    Teeth problem    cracked molars, pt unsure if just right side or both sides    Patient Active Problem List   Diagnosis Date Noted   Encounter for Nexplanon removal 06/16/2021   Contraception management 06/16/2021   History of gestational diabetes 06/23/2016   ALLERGIC RHINITIS 05/31/2007    Past Surgical History:  Procedure Laterality Date   NO PAST SURGERIES       OB History     Gravida  6   Para  3   Term  3   Preterm  0   AB  3   Living  3      SAB  3   IAB  0   Ectopic  0   Multiple  0   Live Births  3           No family history on file.  Social History   Tobacco Use   Smoking status: Former    Types: Cigarettes   Smokeless tobacco: Never  Vaping Use   Vaping Use: Never used  Substance Use Topics   Alcohol use: No   Drug use: No    Home Medications Prior to Admission medications   Medication Sig Start Date End Date Taking? Authorizing Provider  desogestrel-ethinyl estradiol (APRI) 0.15-30 MG-MCG tablet Take 1 tablet by mouth daily. 06/17/21   Hermina Staggers, MD  ibuprofen (ADVIL) 600 MG tablet Take 1 tablet (600 mg total) by mouth every 6 (six)  hours. 03/03/20   Marny Lowenstein, PA-C    Allergies    Patient has no known allergies.  Review of Systems   Review of Systems  Constitutional:  Positive for appetite change, chills, fatigue and fever.  HENT:  Positive for congestion and sore throat.   Eyes:  Positive for redness. Negative for visual disturbance.  Respiratory:  Positive for cough and shortness of breath.   Cardiovascular:  Negative for chest pain and palpitations.  Gastrointestinal:  Positive for nausea. Negative for abdominal pain.  Genitourinary:  Negative for dysuria and hematuria.  Musculoskeletal:  Positive for arthralgias and myalgias.  Skin:  Negative for color change and rash.  Neurological:  Positive for headaches. Negative for syncope.  All other systems reviewed and are negative.  Physical Exam Updated Vital Signs BP (!) 132/97 (BP Location: Right Arm)   Pulse (!) 107   Temp 98.9 F (37.2 C) (Oral)   Resp 17   SpO2 92%   Physical Exam Constitutional:      General: She is not in acute distress. HENT:     Head: Normocephalic and atraumatic.  Eyes:  Pupils: Pupils are equal, round, and reactive to light.     Comments: Mildly injected conjunctiva bilaterally, tearful  Cardiovascular:     Rate and Rhythm: Regular rhythm. Tachycardia present.     Pulses: Normal pulses.  Pulmonary:     Effort: Pulmonary effort is normal. No respiratory distress.     Comments: Coughing Abdominal:     General: There is no distension.     Tenderness: There is no abdominal tenderness.  Skin:    General: Skin is warm and dry.  Neurological:     General: No focal deficit present.     Mental Status: She is alert. Mental status is at baseline.  Psychiatric:        Mood and Affect: Mood normal.        Behavior: Behavior normal.    ED Results / Procedures / Treatments   Labs (all labs ordered are listed, but only abnormal results are displayed) Labs Reviewed  RESP PANEL BY RT-PCR (FLU A&B, COVID) [id number]     EKG EKG Interpretation  Date/Time:  Sunday June 26 2021 11:53:11 EST Ventricular Rate:  103 PR Interval:  116 QRS Duration: 78 QT Interval:  340 QTC Calculation: 445 R Axis:   42 Text Interpretation: Sinus tachycardia Confirmed by Octaviano Glow [phone]) on 06/26/2021 1:43:35 PM  Radiology DG Chest 2 View  Result Date: 06/26/2021 CLINICAL DATA:  Chest pain and cough EXAM: CHEST - 2 VIEW COMPARISON:  Chest x-ray 06/22/2015 FINDINGS: Heart size and mediastinal contours are within normal limits. No suspicious pulmonary opacities identified. No pleural effusion or pneumothorax visualized. No acute osseous abnormality appreciated. IMPRESSION: No acute intrathoracic process identified. Electronically Signed   By: Ofilia Neas M.D.   On: 06/26/2021 14:29    Procedures Procedures   Medications Ordered in ED Medications - No data to display  ED Course  I have reviewed the triage vital signs and the nursing notes.  Pertinent labs & imaging results that were available during my care of the patient were reviewed by me and considered in my medical decision making (see chart for details).  Patient is here with strongly suspected viral syndrome in the presence of 2 children at home who are positive for RSV.  The patient's COVID and flu test were negative.  However there is no RSV on the adult testing.  I explained this is most likely a virus and should anticipate should be feeling better in the next 2 to 3 days.  She is now on day 4 of her symptoms.  I have a lower suspicion for sepsis.  I did note that she had an oxygen level of 92% at triage, and does report a productive cough, and so I think an x-ray to rule out bacterial pneumonia would be reasonable.  Clinical Course as of 06/26/21 1443  Sun Jun 26, 2021  1442 X-ray is unremarkable.  Anticipate discharge home with conservative management for the next few days.  Patient verbalized understanding of the plan [MT]    Clinical  Course User Index [MT] Gwen Edler, Carola Rhine, MD   Final Clinical Impression(s) / ED Diagnoses Final diagnoses:  Viral URI with cough    Rx / DC Orders ED Discharge Orders     None        Wyvonnia Dusky, MD 06/26/21 1443

## 2021-06-26 NOTE — ED Triage Notes (Signed)
C/o productive cough with green phlegm, congestion, and chest pain with coughing x 3 days.  Her child + for RSV.

## 2021-06-26 NOTE — ED Notes (Signed)
Did did not stay to have d/c v/s.

## 2021-06-26 NOTE — ED Provider Notes (Signed)
Emergency Medicine Provider Triage Evaluation Note  Suzane Vanderweide , a 35 y.o. female  was evaluated in triage.  Pt complains of cold sxs.  Review of Systems  Positive: Cough productive, congestion, chest congestion Negative: N/v/d, dysuria  Physical Exam  BP (!) 132/97 (BP Location: Right Arm)   Pulse (!) 107   Temp 98.9 F (37.2 C) (Oral)   Resp 17   SpO2 92%  Gen:   Awake, no distress   Resp:  Normal effort  MSK:   Moves extremities without difficulty  Other:    Medical Decision Making  Medically screening exam initiated at 12:22 PM.  Appropriate orders placed.  Willette Pa Bradly Bienenstock was informed that the remainder of the evaluation will be completed by another provider, this initial triage assessment does not replace that evaluation, and the importance of remaining in the ED until their evaluation is complete.  Pt here with cold sxs x 3 days.  Her children tested positive for RSV.   Fayrene Helper, PA-C 06/26/21 1224    Terald Sleeper, MD 06/26/21 1254

## 2021-10-12 IMAGING — US US MFM OB FOLLOW-UP
1 series · 13 of 28 positions shown · non-contrast
Comparison: none

[Series 1: us mfm ob follow-up · 13 of 33 slices shown]
[im 2/33]
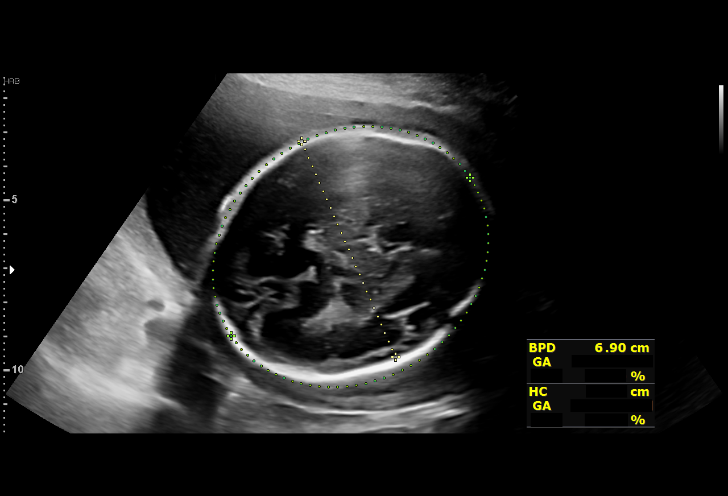
[im 4/33]
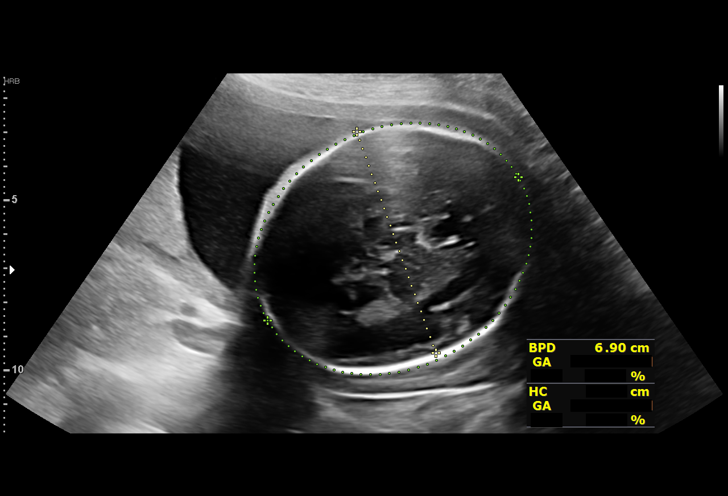
[im 6/33]
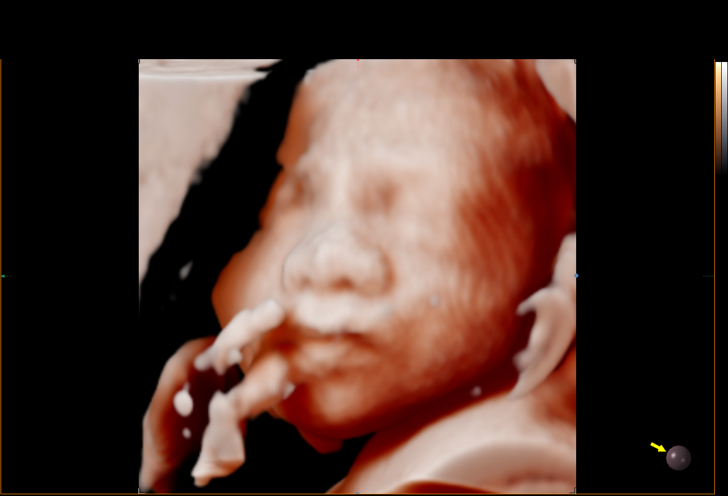
[im 9/33]
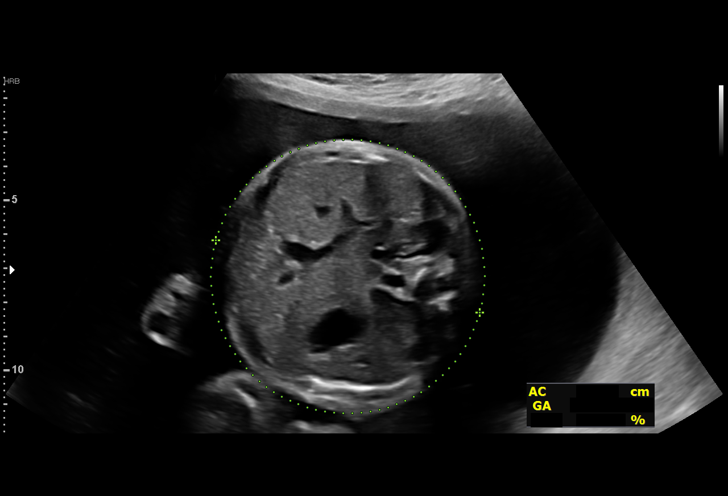
[im 11/33]
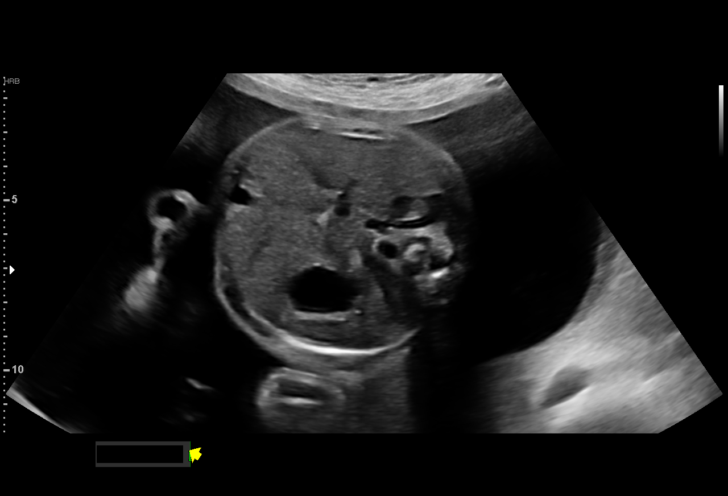
[im 14/33]
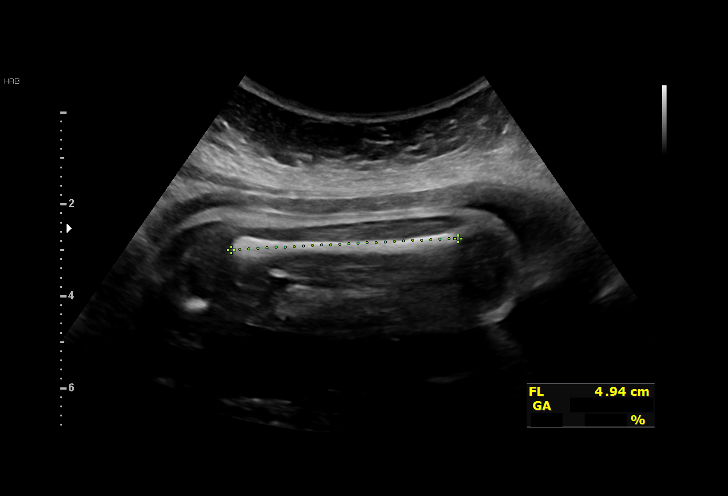
[im 17/33]
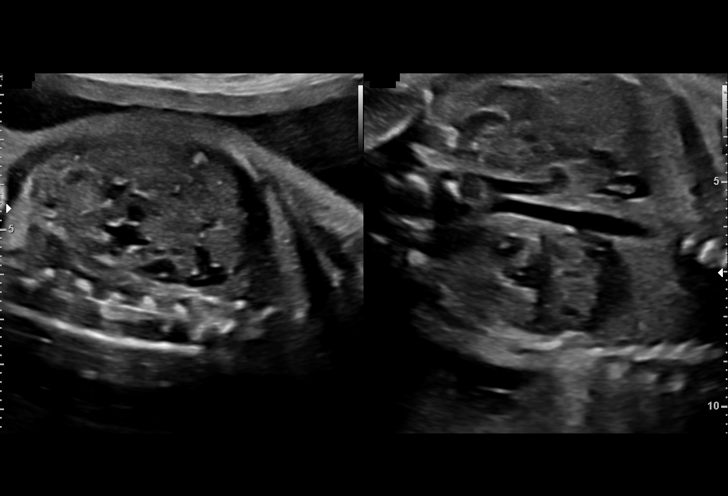
[im 19/33]
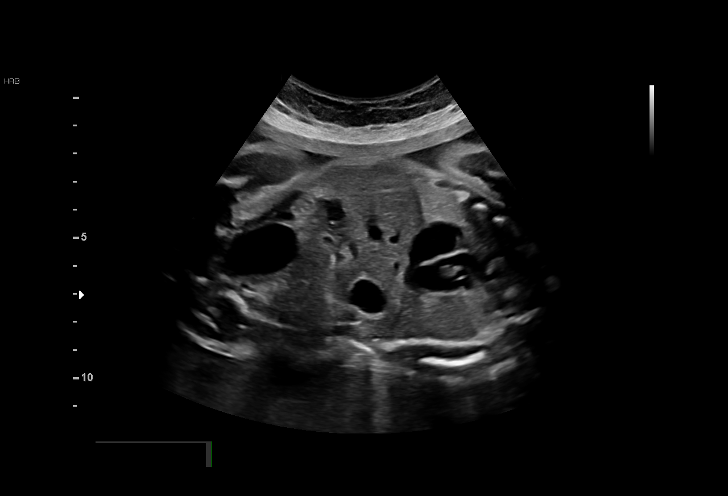
[im 22/33]
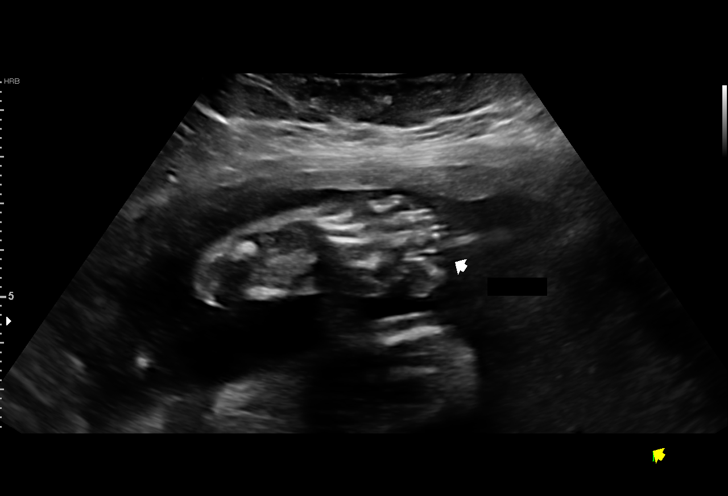
[im 24/33]
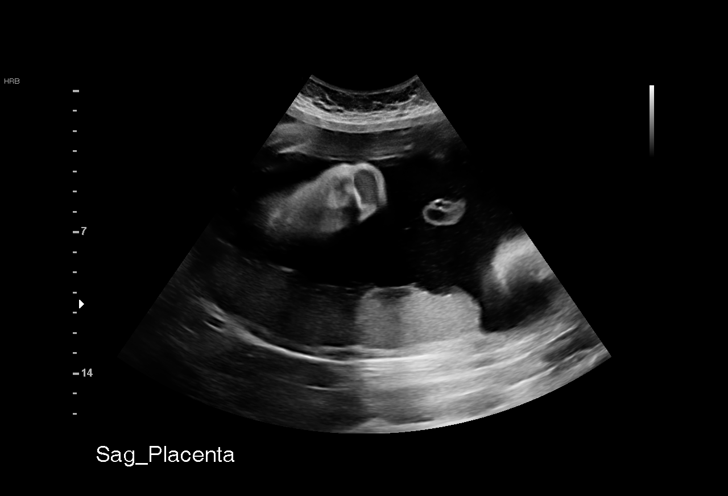
[im 27/33]
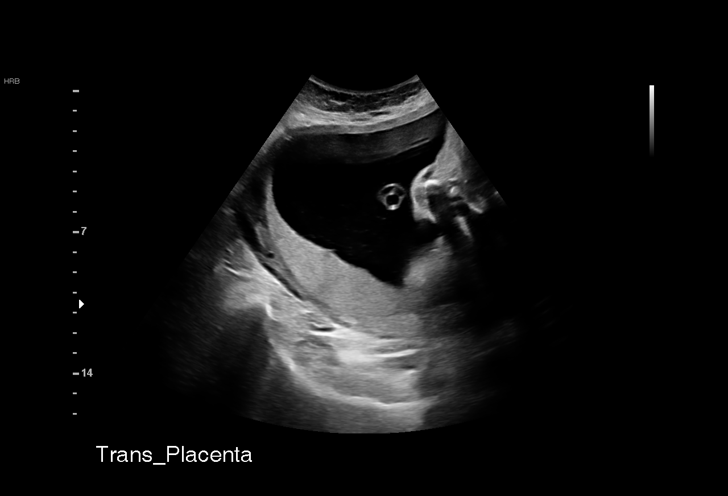
[im 29/33]
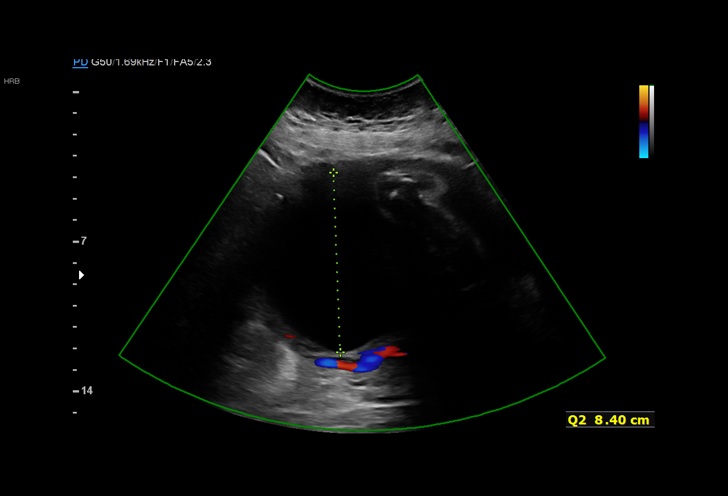
[im 31/33]
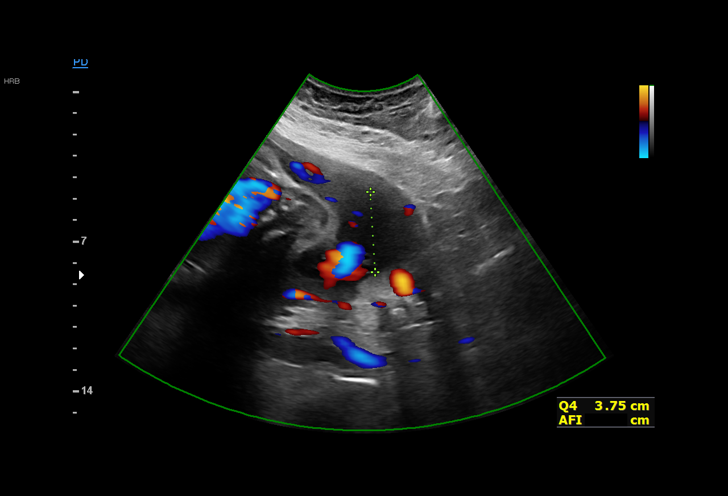

[13 of 28 positions shown; findings below may reference images not displayed]

[REDACTED]
                   [id number]

 ----------------------------------------------------------------------

 ----------------------------------------------------------------------
Indications

  Polyhydramnios, second trimester,
  antepartum condition or complication, fetus
  unspecified
  Gestational diabetes in pregnancy, diet
  controlled
  Encounter for antenatal screening for
  malformations
  26 weeks gestation of pregnancy
 ----------------------------------------------------------------------
Vital Signs

                                                Height:        4'11"
Fetal Evaluation

 Num Of Fetuses:         1
 Fetal Heart Rate(bpm):  146
 Cardiac Activity:       Observed
 Presentation:           Cephalic
 Placenta:               Posterior
 P. Cord Insertion:      Previously Visualized

 Amniotic Fluid
 AFI FV:      Polyhydramnios
 AFI Sum(cm)     %Tile       Largest Pocket(cm)
 27.13           > 97

 RUQ(cm)       RLQ(cm)       LUQ(cm)        LLQ(cm)

Biometry

 BPD:      69.1  mm     G. Age:  27w 5d         75  %    CI:        78.72   %    70 - 86
                                                         FL/HC:      20.1   %    18.6 -
 HC:      246.3  mm     G. Age:  26w 5d         24  %    HC/AC:      0.97        1.05 -
 AC:       255   mm     G. Age:  29w 5d         99  %    FL/BPD:     71.6   %    71 - 87
 FL:       49.5  mm     G. Age:  26w 5d         34  %    FL/AC:      19.4   %    20 - 24

 Est. FW:    4717  gm    2 lb 10 oz      93  %
OB History

 Gravidity:    6         Term:   2        Prem:   0        SAB:   3
 TOP:          0       Ectopic:  0        Living: 2
Gestational Age

 LMP:           23w 2d        Date:  05/14/19                 EDD:   02/18/20
 U/S Today:     27w 5d                                        EDD:   01/18/20
 Best:          26w 5d     Det. By:  Early Ultrasound         EDD:   01/25/20
                                     (06/09/19)
Anatomy

 Cranium:               Appears normal         Aortic Arch:            Not well visualized
 Cavum:                 Appears normal         Ductal Arch:            Previously seen
 Ventricles:            Appears normal         Diaphragm:              Appears normal
 Choroid Plexus:        Previously seen        Stomach:                Appears normal, left
                                                                       sided
 Cerebellum:            Previously seen        Abdomen:                Appears normal
 Posterior Fossa:       Previously seen        Abdominal Wall:         Previously seen
 Nuchal Fold:           Not applicable (>20    Cord Vessels:           Previously seen
                        wks GA)
 Face:                  Orbits and profile     Kidneys:                Appear normal
                        previously seen
 Lips:                  Previously seen        Bladder:                Appears normal
 Thoracic:              Appears normal         Spine:                  Previously seen
 Heart:                 Previously seen        Upper Extremities:      Previously seen
 RVOT:                  Previously seen        Lower Extremities:      Previously seen
 LVOT:                  Previously seen

 Other:  Parents do not wish to know sex of fetus. Technically difficult due to
         maternal habitus and fetal position.
Cervix Uterus Adnexa

 Cervix
 Not visualized (advanced GA >06wks)
Impression

 Patient with gestational diabetes returned for fetal growth
 assessment. Hemoglobin A1 of 5.4% rules [DATE]
 diabetes.
 She reports her fasting levels are in the 70s and postprandial
 levels are below 120s.
 On today's ultrasound, the estimated fetal weight is at the
 93rd percentile. Mild polyhydramnios is seen (AFI=27 cm).
 Good fetal activity is present.
 I explained the findings and encouraged her to continue
 blood glucose monitoring and bring the log book at her
 prenatal and ultrasound visits.
Recommendations

 -An appointment was made for her to return in 4 weeks for
 fetal growth assessment.
                 JOHANN

## 2021-12-07 IMAGING — US US MFM FETAL BPP W/O NON-STRESS
1 series · 13 of 28 positions shown · non-contrast
Comparison: none

[Series 1: us mfm fetal bpp w/o non-stress · 44 acquisitions, 13 frames shown]
[im 2/44]
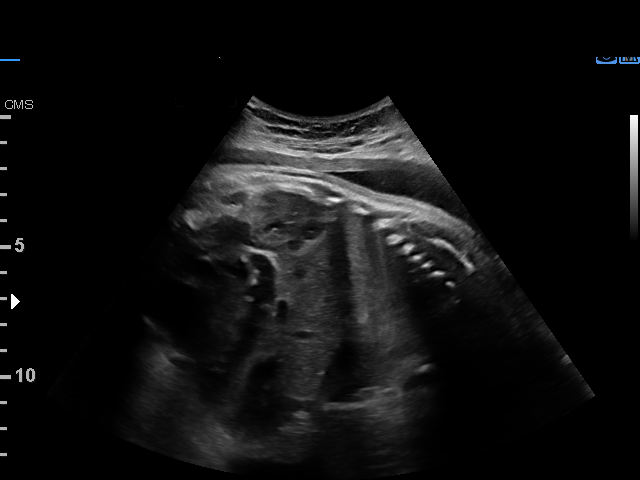
[im 5/44]
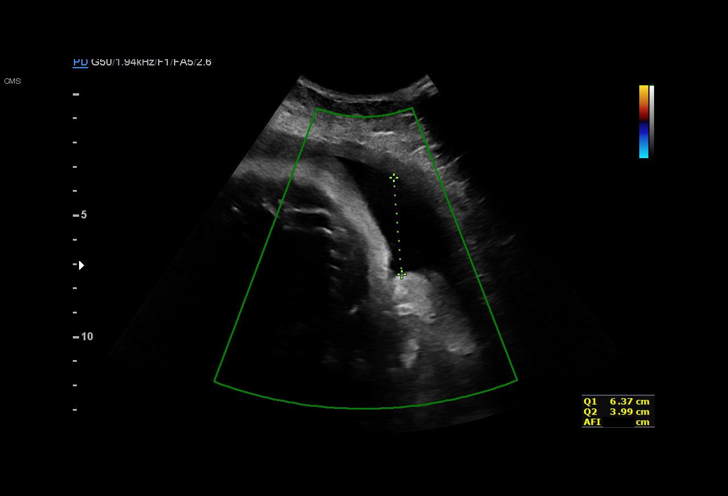
[im 8/44]
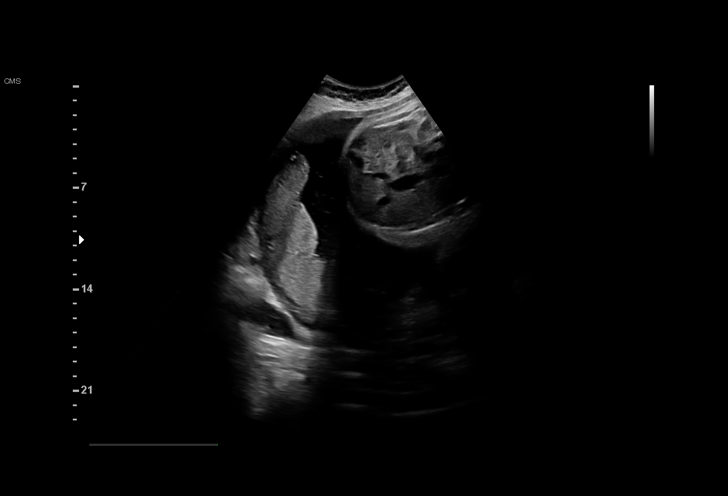
[im 12/44]
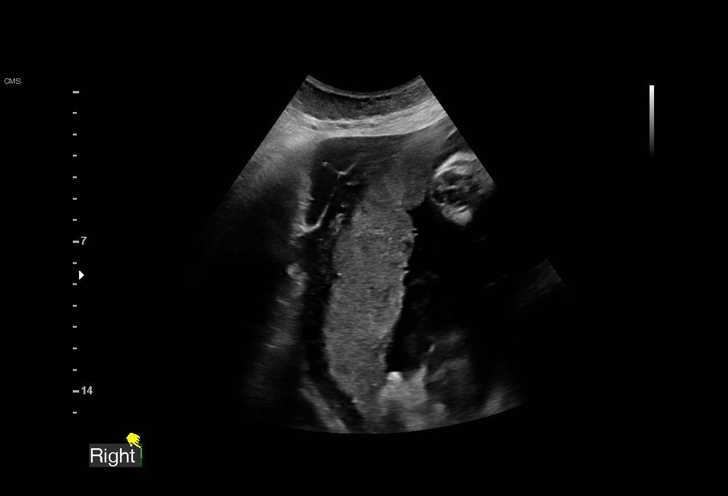
[im 15/44]
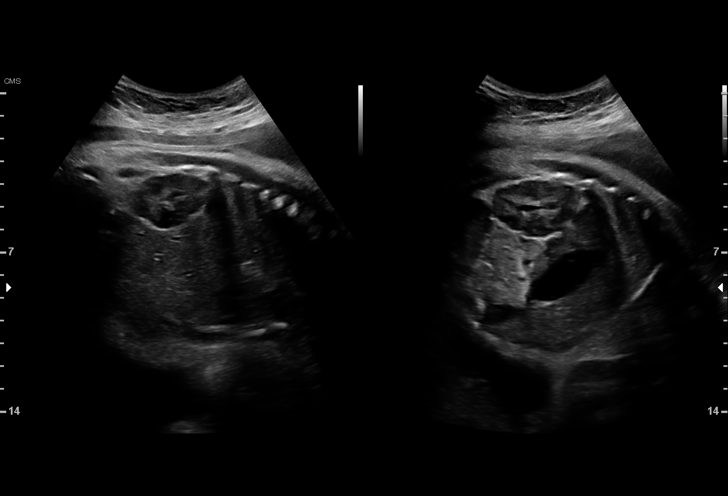
[im 18/44]
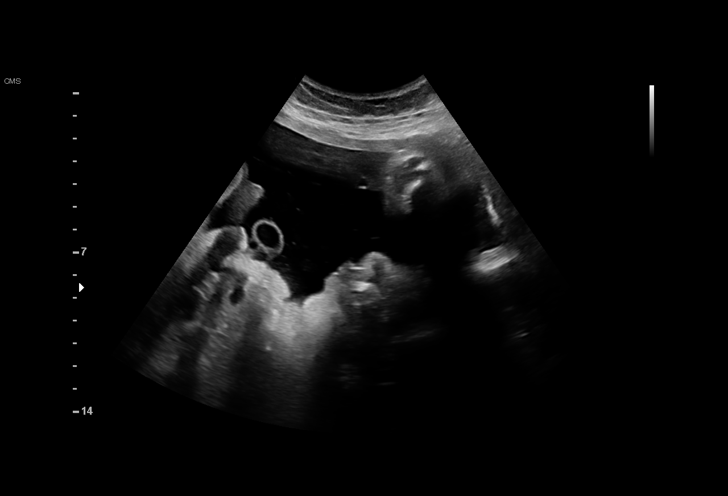
[im 23/44]
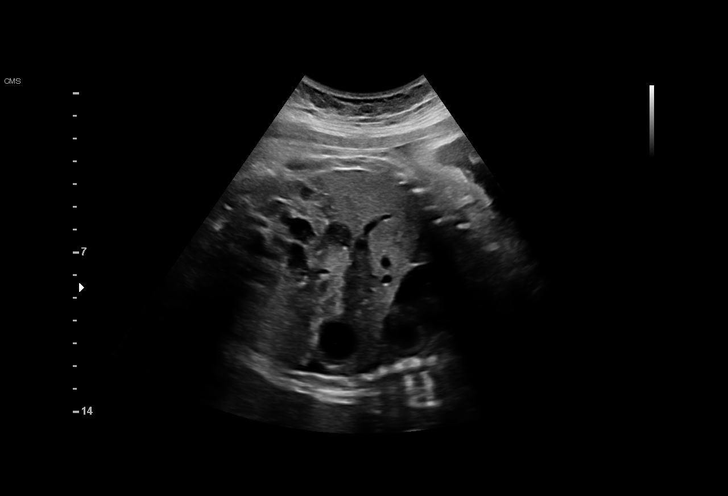
[im 26/44]
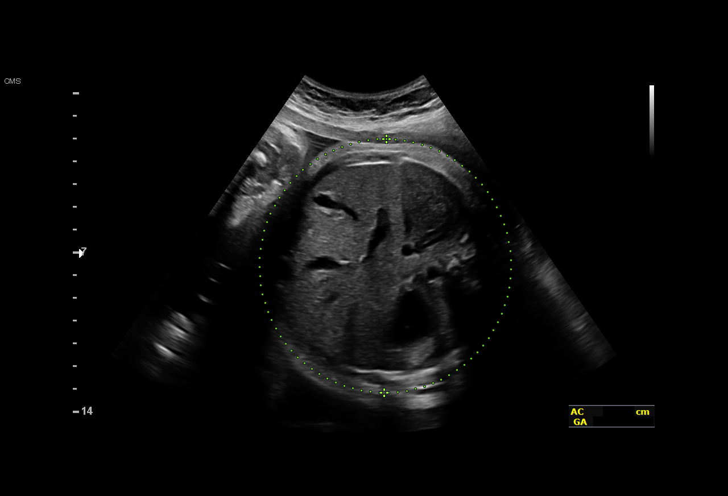
[im 29/44]
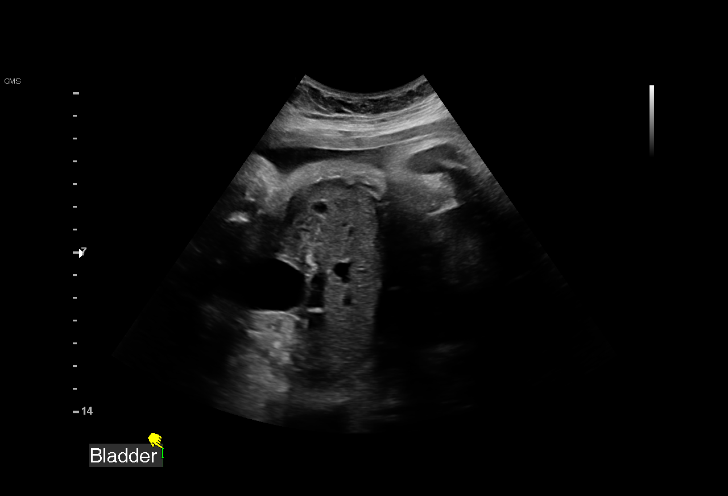
[im 32/44]
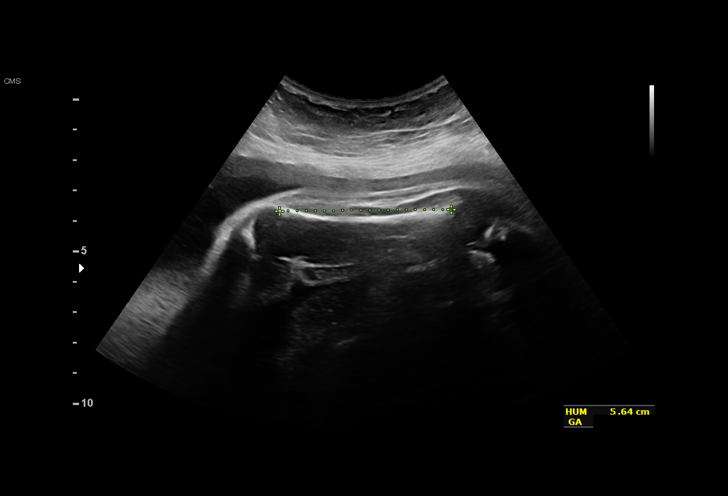
[im 36/44]
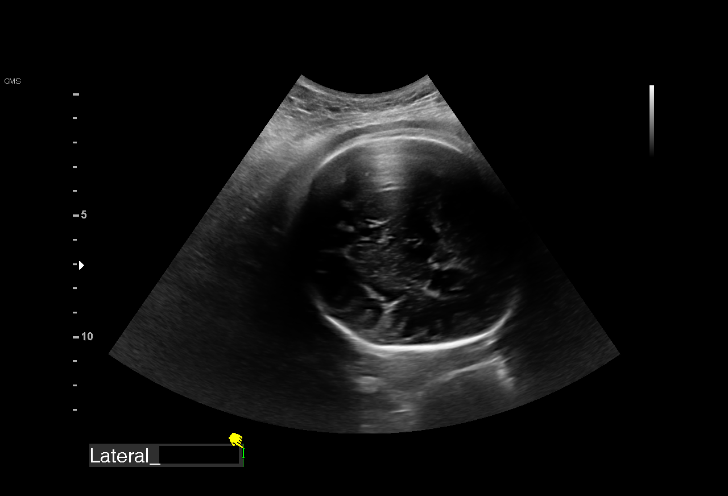
[im 39/44]
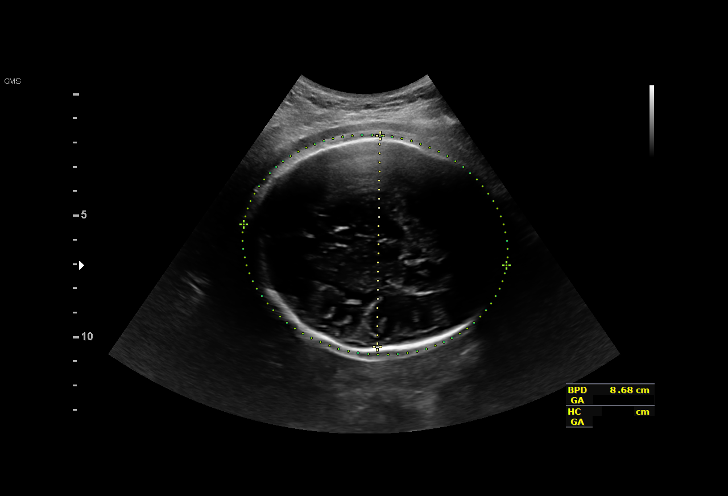
[im 42/44]
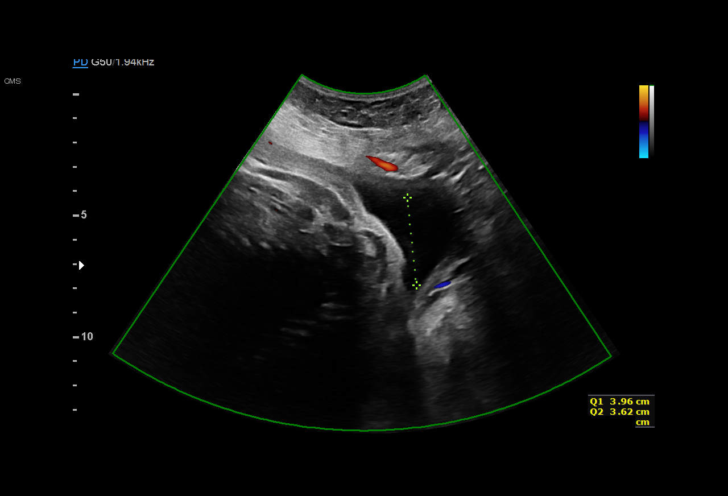

[13 of 28 positions shown; findings below may reference images not displayed]

[REDACTED]
                   [id number]

Indications

 Gestational diabetes in pregnancy,
 controlled by oral hypoglycemic drugs
 Polyhydramnios, third trimester, antepartum
 condition or complication, unspecified fetus
 Large for gestational age fetus affecting
 management of mother
 Encounter for other specified antenatal
 screening
 34 weeks gestation of pregnancy
Vital Signs

                                                Height:        4'11"
Fetal Evaluation

 Num Of Fetuses:         1
 Fetal Heart Rate(bpm):  158
 Cardiac Activity:       Observed
 Presentation:           Cephalic
 Placenta:               Posterior
 P. Cord Insertion:      Previously Visualized
 Amniotic Fluid
 AFI FV:      Within normal limits

 AFI Sum(cm)     %Tile       Largest Pocket(cm)
 19.3            72          6

 RUQ(cm)       RLQ(cm)       LUQ(cm)        LLQ(cm)
 4             5.7           3.6            6
Biophysical Evaluation

 Amniotic F.V:   Within normal limits       F. Tone:        Observed
 F. Movement:    Observed                   Score:          [DATE]
 F. Breathing:   Observed
Biometry

 BPD:      86.9  mm     G. Age:  35w 1d         62  %    CI:        77.45   %    70 - 86
                                                         FL/HC:      20.0   %    20.1 -
 HC:      312.6  mm     G. Age:  35w 0d         23  %    HC/AC:      0.89        0.93 -
 AC:       350   mm     G. Age:  38w 6d       > 99  %    FL/BPD:     72.0   %    71 - 87
 FL:       62.6  mm     G. Age:  32w 3d        3.2  %    FL/AC:      17.9   %    20 - 24
 HUM:      55.8  mm     G. Age:  32w 3d         17  %

 Est. FW:    9167  gm      6 lb 9 oz     91  %
OB History

 Gravidity:    6         Term:   2        Prem:   0        SAB:   3
 TOP:          0       Ectopic:  0        Living: 2
Gestational Age

 LMP:           31w 2d        Date:  05/14/19                 EDD:   02/18/20
 U/S Today:     35w 3d                                        EDD:   01/20/20
 Best:          34w 5d     Det. By:  Early Ultrasound         EDD:   01/25/20
                                     (06/09/19)
Anatomy

 Cranium:               Appears normal         Aortic Arch:            Not well visualized
 Cavum:                 Previously seen        Ductal Arch:            Previously seen
 Ventricles:            Appears normal         Diaphragm:              Appears normal
 Choroid Plexus:        Previously seen        Stomach:                Appears normal, left
                                                                       sided
 Cerebellum:            Previously seen        Abdomen:                Appears normal
 Posterior Fossa:       Previously seen        Abdominal Wall:         Previously seen
 Nuchal Fold:           Not applicable (>20    Cord Vessels:           Previously seen
                        wks GA)
 Face:                  Orbits and profile     Kidneys:                Appear normal
                        previously seen
 Lips:                  Previously seen        Bladder:                Appears normal
 Thoracic:              Appears normal         Spine:                  Previously seen
 Heart:                 Previously seen        Upper Extremities:      Previously seen
 RVOT:                  Appears normal         Lower Extremities:      Previously seen
 LVOT:                  Appears normal

 Other:  Technically difficult due to maternal habitus and fetal position.
Cervix Uterus Adnexa
 Cervix
 Not visualized (advanced GA >36wks)

 Uterus
 No abnormality visualized.

 Cul De Sac
 No free fluid seen.

 Adnexa
 No abnormality visualized.
Impression

 The estimated fetal weight is at the 91st percentile. Amniotic
 fluid is normal and good fetal activity is seen .Antenatal
 testing is reassuring. BPP [DATE].
 Cephalic presentation.
Recommendations

 -Continue weekly BPP till delivery.
                 EITAN

## 2021-12-15 ENCOUNTER — Ambulatory Visit (INDEPENDENT_AMBULATORY_CARE_PROVIDER_SITE_OTHER): Payer: Medicaid Other | Admitting: Obstetrics and Gynecology

## 2021-12-15 ENCOUNTER — Encounter: Payer: Self-pay | Admitting: Obstetrics and Gynecology

## 2021-12-15 DIAGNOSIS — N3020 Other chronic cystitis without hematuria: Secondary | ICD-10-CM | POA: Insufficient documentation

## 2021-12-15 DIAGNOSIS — R102 Pelvic and perineal pain: Secondary | ICD-10-CM | POA: Diagnosis not present

## 2021-12-15 HISTORY — DX: Other chronic cystitis without hematuria: N30.20

## 2021-12-15 HISTORY — DX: Pelvic and perineal pain: R10.2

## 2021-12-15 MED ORDER — SENNA 8.6 MG PO TABS: 1.0000 | ORAL_TABLET | Freq: Every day | ORAL | 1 refills | Status: DC

## 2021-12-15 MED ORDER — NITROFURANTOIN MONOHYD MACRO 100 MG PO CAPS: ORAL_CAPSULE | ORAL | 0 refills | Status: DC

## 2021-12-15 NOTE — Progress Notes (Signed)
Pt states she has been having sharp pelvic pain for a short time.  Pt is having irregular cycles with pills.  Pt does have problems with constipation.

## 2021-12-15 NOTE — Progress Notes (Signed)
Ms Tracy [name] presents with c/o pelvic pain for the last 3-4 months. Pain more intense with intercourse. Some problems with constipation as well Cycles monthly on OCP's.  PE AF VSS Chaperone present during exam  Lungs clear Heart RRR Abd soft + BS GU NL EGBUS, + bladder tenderness, uterus small mobile, no adnexal masses or tenderness  A/P Pelvic pain        Chronic cystitis         Constipation Will check GYN U/S. [place] for constipation. Macrobid for cystitis. F/U in 6 weeks

## 2021-12-22 ENCOUNTER — Ambulatory Visit (HOSPITAL_BASED_OUTPATIENT_CLINIC_OR_DEPARTMENT_OTHER): Payer: Medicaid Other

## 2021-12-27 ENCOUNTER — Ambulatory Visit (HOSPITAL_BASED_OUTPATIENT_CLINIC_OR_DEPARTMENT_OTHER): Admission: RE | Admit: 2021-12-27 | Payer: Medicaid Other | Source: Ambulatory Visit

## 2021-12-31 ENCOUNTER — Ambulatory Visit (HOSPITAL_BASED_OUTPATIENT_CLINIC_OR_DEPARTMENT_OTHER)
Admission: RE | Admit: 2021-12-31 | Discharge: 2021-12-31 | Disposition: A | Discharge status: Home / Self Care | Payer: Medicaid Other | Source: Ambulatory Visit | Attending: Obstetrics and Gynecology | Admitting: Obstetrics and Gynecology

## 2021-12-31 DIAGNOSIS — R102 Pelvic and perineal pain: Secondary | ICD-10-CM | POA: Diagnosis present

## 2022-01-26 ENCOUNTER — Ambulatory Visit: Payer: Medicaid Other | Admitting: Obstetrics and Gynecology

## 2022-07-31 NOTE — L&D Delivery Note (Signed)
OB/GYN Faculty Practice Delivery Note  Tracy [name] is a 36 y.o. [id number] s/p Breech SVD at [redacted]w[redacted]d. She was admitted for SROM.   ROM: 16h 45m with clear fluid GBS Status:  NEGATIVE/-- (09/11 0744) Maximum Maternal Temperature: 98.30F  Labor Progress: Initial SVE: 1/60/ballotable oblique. Pitocin augmentation was started. Fetal position felt to have changed. Patient re scanned with bedside US, frank breech. Strong maternal desire for vaginal delivery. She progressed to complete with pitocin augmentation.   Delivery Date/Time: 04/11/2023 @ 2118 Delivery: Called to room and patient was complete and pushing. Strong maternal effort with slow steady progress from -3 station. Then rapid progress after left lateral repositioning from +1 to delivery to umbilicus. Right then left legs delivered with gentle rotation of infant. Then delivered infant to axilla. With gentle rotation from side to side left then right arm delivered. Head delivered easily after. No nuchal cord present. Infant placed on maternal abdomen where immediate tactile resuscitation and bulb syringe suction were administered. Infant with spontaneous cry after 40 seconds.   Cord clamped x 2 after 1-minute delay, and cut by FOB. Cord blood drawn. Placenta delivered spontaneously with gentle cord traction. Fundus firm with massage. Pitocin given via IM injection due to loss of IV access. Labia, perineum, vagina, and cervix inspected without lesion.   Baby Weight: 3,544g  Placenta: 3 vessel, intact. Sent to L&D Complications: Breech Lacerations: None EBL: 220 mL Analgesia: Epidural   Infant:  APGAR (1 MIN):  7 APGAR (5 MINS):  8  Wyn Forster, MD OB Family Medicine Fellow, [facility] for [facility], [facility] 04/11/2023, 9:39 PM

## 2022-09-19 ENCOUNTER — Other Ambulatory Visit: Payer: Self-pay

## 2022-09-19 ENCOUNTER — Inpatient Hospital Stay (HOSPITAL_COMMUNITY)
Admission: AD | Admit: 2022-09-19 | Discharge: 2022-09-19 | Disposition: A | Discharge status: Home / Self Care | Payer: Medicaid Other | Attending: Obstetrics and Gynecology | Admitting: Obstetrics and Gynecology

## 2022-09-19 ENCOUNTER — Encounter (HOSPITAL_COMMUNITY): Payer: Self-pay

## 2022-09-19 ENCOUNTER — Inpatient Hospital Stay (HOSPITAL_COMMUNITY): Payer: Medicaid Other

## 2022-09-19 DIAGNOSIS — R300 Dysuria: Secondary | ICD-10-CM | POA: Diagnosis not present

## 2022-09-19 DIAGNOSIS — O26891 Other specified pregnancy related conditions, first trimester: Secondary | ICD-10-CM | POA: Diagnosis not present

## 2022-09-19 DIAGNOSIS — R109 Unspecified abdominal pain: Secondary | ICD-10-CM | POA: Diagnosis not present

## 2022-09-19 DIAGNOSIS — O09521 Supervision of elderly multigravida, first trimester: Secondary | ICD-10-CM | POA: Diagnosis not present

## 2022-09-19 DIAGNOSIS — O09291 Supervision of pregnancy with other poor reproductive or obstetric history, first trimester: Secondary | ICD-10-CM | POA: Diagnosis not present

## 2022-09-19 DIAGNOSIS — Z3A01 Less than 8 weeks gestation of pregnancy: Secondary | ICD-10-CM | POA: Diagnosis not present

## 2022-09-19 DIAGNOSIS — O24111 Pre-existing diabetes mellitus, type 2, in pregnancy, first trimester: Secondary | ICD-10-CM | POA: Diagnosis not present

## 2022-09-19 DIAGNOSIS — R102 Pelvic and perineal pain: Secondary | ICD-10-CM | POA: Diagnosis present

## 2022-09-19 DIAGNOSIS — Z3491 Encounter for supervision of normal pregnancy, unspecified, first trimester: Secondary | ICD-10-CM

## 2022-09-19 LAB — POCT PREGNANCY, URINE: Preg Test, Ur: POSITIVE — AB

## 2022-09-19 LAB — URINALYSIS, ROUTINE W REFLEX MICROSCOPIC
Bilirubin Urine: NEGATIVE
Glucose, UA: NEGATIVE mg/dL
Hgb urine dipstick: NEGATIVE
Ketones, ur: NEGATIVE mg/dL
Leukocytes,Ua: NEGATIVE
Nitrite: NEGATIVE
Protein, ur: NEGATIVE mg/dL
Specific Gravity, Urine: 1.012 (ref 1.005–1.030)
pH: 7 (ref 5.0–8.0)

## 2022-09-19 LAB — HCG, QUANTITATIVE, PREGNANCY: hCG, Beta Chain, Quant, S: 88596 m[IU]/mL — ABNORMAL HIGH (ref <5)

## 2022-09-19 MED ORDER — PREPLUS 27-1 MG PO TABS: 1.0000 | ORAL_TABLET | Freq: Every day | ORAL | 13 refills | Status: DC

## 2022-09-19 NOTE — MAU Note (Signed)
Tracy [name] Tracy [name] is a 37 y.o. at 41w3dhere in MAU reporting: lower abdominal pain that started yesterday. Pain radiates down both legs. Also having burning when she urinates. No bleeding or discharge.  LMP: 07/29/22  Onset of complaint: today  Pain score: 7/10  Vitals:   09/19/22 1747  BP: 127/78  Pulse: (!) 103  Resp: 16  Temp: 98.3 F (36.8 C)  SpO2: 97%     FHT:NA  Lab orders placed from triage: upt, ua

## 2022-09-19 NOTE — MAU Provider Note (Signed)
History     CSN: [id number]  Arrival date and time: 09/19/22 1713   Event Date/Time   First Provider Initiated Contact with Patient 09/19/22 1759      Chief Complaint  Patient presents with   Abdominal Pain   Abdominal Pain This is a new problem. The current episode started yesterday. The onset quality is gradual. The problem has been waxing and waning. The pain is located in the suprapubic region. The pain is at a severity of 1/10. The pain is mild. The quality of the pain is colicky. Associated symptoms include dysuria and frequency. Pertinent negatives include no constipation, diarrhea, fever, hematuria, nausea or vomiting. Nothing aggravates the pain. The pain is relieved by Nothing. She has tried nothing for the symptoms.   Patient is 37 y.o. [id number] 33w3dhere with complaints of cramping in early pregnancy. Patient reports dysuria and increased urinary frequency. She has history SABx3. Reports an abnormally early cycle in December. She has history of diabetes  denies LOF, VB, contractions, vaginal discharge.   OB History     Gravida  7   Para  3   Term  3   Preterm  0   AB  3   Living  3      SAB  3   IAB  0   Ectopic  0   Multiple  0   Live Births  3           Past Medical History:  Diagnosis Date   Depression    Diabetes mellitus without complication (HCC)    diet controlled   Gestational diabetes    Teeth problem    cracked molars, pt unsure if just right side or both sides    Past Surgical History:  Procedure Laterality Date   NO PAST SURGERIES      No family history on file.  Social History   Tobacco Use   Smoking status: Former    Types: Cigarettes   Smokeless tobacco: Never  Vaping Use   Vaping Use: Never used  Substance Use Topics   Alcohol use: No   Drug use: No    Allergies: No Known Allergies  Medications Prior to Admission  Medication Sig Dispense Refill Last Dose   desogestrel-ethinyl estradiol (APRI) 0.15-30  MG-MCG tablet Take 1 tablet by mouth daily. 28 tablet 11    ibuprofen (ADVIL) 600 MG tablet Take 1 tablet (600 mg total) by mouth every 6 (six) hours. 30 tablet 0    nitrofurantoin, macrocrystal-monohydrate, (MACROBID) 100 MG capsule Take 1 tablet by mouth twice a day for seven days and then one tablet by month at bedtime until gone 35 capsule 0    phentermine 30 MG capsule Take 30 mg by mouth every morning.      senna (SENOKOT) 8.6 MG TABS tablet Take 1 tablet (8.6 mg total) by mouth daily. 120 tablet 1     Review of Systems  Constitutional:  Negative for chills and fever.  HENT:  Negative for congestion and sore throat.   Eyes:  Negative for pain and visual disturbance.  Respiratory:  Negative for cough, chest tightness and shortness of breath.   Cardiovascular:  Negative for chest pain.  Gastrointestinal:  Positive for abdominal pain. Negative for constipation, diarrhea, nausea and vomiting.  Endocrine: Negative for cold intolerance and heat intolerance.  Genitourinary:  Positive for dysuria and frequency. Negative for flank pain and hematuria.  Musculoskeletal:  Negative for back pain.  Skin:  Negative for rash.  Allergic/Immunologic: Negative for food allergies.  Neurological:  Negative for dizziness and light-headedness.  Psychiatric/Behavioral:  Negative for agitation.    Physical Exam   Blood pressure 127/78, pulse (!) 103, temperature 98.3 F (36.8 C), temperature source Oral, resp. rate 16, weight 75.6 kg, last menstrual period 07/29/2022, SpO2 97 %, unknown if currently breastfeeding.  Physical Exam Vitals and nursing note reviewed.  Constitutional:      General: She is not in acute distress.    Appearance: She is well-developed.  HENT:     Head: Normocephalic and atraumatic.  Eyes:     General: No scleral icterus.    Conjunctiva/sclera: Conjunctivae normal.  Cardiovascular:     Rate and Rhythm: Normal rate.  Pulmonary:     Effort: Pulmonary effort is normal.   Chest:     Chest wall: No tenderness.  Abdominal:     General: Bowel sounds are normal.     Palpations: Abdomen is soft.     Tenderness: There is abdominal tenderness in the suprapubic area. There is no guarding or rebound.  Genitourinary:    Vagina: Normal.  Musculoskeletal:        General: Normal range of motion.     Cervical back: Normal range of motion and neck supple.  Skin:    General: Skin is warm and dry.     Findings: No rash.  Neurological:     Mental Status: She is alert and oriented to person, place, and time.     MAU Course  Procedures  MDM: moderate  This patient presents to the ED for concern of   Chief Complaint  Patient presents with   Abdominal Pain     These complains involves an extensive number of treatment options, and is a complaint that carries with it a high risk of complications and morbidity.  The differential diagnosis for  1. Abdominal pain INCLUDES UTI, flare of chronic cystitis, miscarriage, ectopic, normal variant.   Co morbidities that complicate the patient evaluation: Type 2 Diabetes, no on medication. Prior SABx3  External records from outside source obtained and reviewed including Scanned media records, CareEverywhere, and Prenatal care records  Lab Tests: UA, Urine Culture, and BHCG  I ordered, and personally interpreted labs.  The pertinent results include:  UA- negative (urine culture sent due to complaints), BHCG is pending but [place] is reassuring.   Imaging Studies ordered:  I ordered imaging studies includingTransvaginal [place] I independently visualized and interpreted imaging which showed live IUP consistent with LMP I agree with the radiologist interpretation  Medicines ordered and prescription drug management:  Medications: Rx for prenatal vitamins   Reevaluation of the patient after these medicines showed that the patient improved  I have reviewed the patients home medicines and have made adjustments as needed  MAU  Course:  Reviewed [place] results with patient Given images from radiology  After the interventions noted above, I reevaluated the patient and found that they have :improved  Dispostion: discharged  Assessment and Plan   1. Viable pregnancy in first trimester   2. Dysuria during pregnancy in first trimester    Establish prenatal care Follow up urine culture results  Caren Macadam 09/19/2022, 7:14 PM

## 2022-09-21 LAB — CULTURE, OB URINE: Culture: NO GROWTH

## 2022-10-17 ENCOUNTER — Ambulatory Visit (INDEPENDENT_AMBULATORY_CARE_PROVIDER_SITE_OTHER): Payer: Medicaid Other

## 2022-10-17 DIAGNOSIS — Z1332 Encounter for screening for maternal depression: Secondary | ICD-10-CM

## 2022-10-17 DIAGNOSIS — Z3689 Encounter for other specified antenatal screening: Secondary | ICD-10-CM | POA: Diagnosis not present

## 2022-10-17 DIAGNOSIS — Z3480 Encounter for supervision of other normal pregnancy, unspecified trimester: Secondary | ICD-10-CM | POA: Insufficient documentation

## 2022-10-17 MED ORDER — BLOOD PRESSURE KIT DEVI: 1.0000 | 0 refills | Status: DC

## 2022-10-17 MED ORDER — VITAFOL GUMMIES 3.33-0.333-34.8 MG PO CHEW: 3.0000 | CHEWABLE_TABLET | Freq: Every day | ORAL | 11 refills | Status: AC

## 2022-10-17 NOTE — Progress Notes (Signed)
New OB Intake  I connected with Tracy [name]  on 10/17/22 at 10:15 AM EDT by telephone Video Visit and verified that I am speaking with the correct person using two identifiers. Nurse is located at [facility] and pt is located at Home.  I discussed the limitations, risks, security and privacy concerns of performing an evaluation and management service by telephone and the availability of in person appointments. I also discussed with the patient that there may be a patient responsible charge related to this service. The patient expressed understanding and agreed to proceed.  I explained I am completing New OB Intake today. We discussed EDD of 05/05/23 that is based on LMP of 07/29/22. Pt is G7/P3033. I reviewed her allergies, medications, Medical/Surgical/OB history, and appropriate screenings. I informed her of [facility] services. [facility] information placed in AVS. Based on history, this is a low risk pregnancy.  Patient Active Problem List   Diagnosis Date Noted   Pelvic pain 12/15/2021   Chronic cystitis 12/15/2021   Encounter for Nexplanon removal 06/16/2021   Contraception management 06/16/2021   History of gestational diabetes 06/23/2016   ALLERGIC RHINITIS 05/31/2007    Concerns addressed today  Delivery Plans Plans to deliver at [facility]. Patient given information for [facility] Baby website for more information about Women's and [place]. Patient is not interested in water birth. Offered upcoming OB visit with CNM to discuss further.  MyChart/Babyscripts MyChart access verified. I explained pt will have some visits in office and some virtually. Babyscripts instructions given and order placed. Patient verifies receipt of registration text/e-mail. Account successfully created and app downloaded.  Blood Pressure Cuff/Weight Scale Blood pressure cuff ordered for patient to pick-up from [employer]. Explained after first prenatal appt pt will check weekly and document  in 7. Patient does not have weight scale; declines to track weight weekly in Babyscripts.  Anatomy US Explained first scheduled [place] will be around 19 weeks. Anatomy US TBD.   Labs Discussed Johnsie Cancel genetic screening with patient. Would like both Panorama and Horizon drawn at new OB visit. Routine prenatal labs needed.  COVID Vaccine Patient has had COVID vaccine.   Is patient a CenteringPregnancy candidate?  Declined Declined due to Group setting Not a candidate due to Language barrier If accepted,   Social Determinants of Health Food Insecurity: Patient denies food insecurity. WIC Referral: Patient is interested in referral to [facility].  Transportation: Patient denies transportation needs. Childcare: Discussed no children allowed at ultrasound appointments. Offered childcare services; patient declines childcare services at this time.  Interested in [place]? If yes, send referral and doula dot phrase.   First visit review I reviewed new OB appt with patient. I explained they will have a provider visit that includes prenatal labs, pap smear, genetic screening, and discuss plan of care for pregnancy. Explained pt will be seen by Johnston Ebbs at first visit; encounter routed to appropriate provider. Explained that patient will be seen by pregnancy navigator following visit with provider.   Lucianne Lei, RN 10/17/2022  10:02 AM

## 2022-10-19 ENCOUNTER — Encounter: Payer: Self-pay | Admitting: Obstetrics

## 2022-10-19 ENCOUNTER — Other Ambulatory Visit: Payer: Self-pay

## 2022-10-19 ENCOUNTER — Other Ambulatory Visit (HOSPITAL_COMMUNITY)
Admission: RE | Admit: 2022-10-19 | Discharge: 2022-10-19 | Disposition: A | Discharge status: Home / Self Care | Payer: Medicaid Other | Source: Ambulatory Visit | Attending: Student | Admitting: Student

## 2022-10-19 ENCOUNTER — Encounter: Payer: Self-pay | Admitting: Student

## 2022-10-19 ENCOUNTER — Ambulatory Visit (INDEPENDENT_AMBULATORY_CARE_PROVIDER_SITE_OTHER): Payer: Medicaid Other | Admitting: Student

## 2022-10-19 VITALS — BP 133/93 | HR 103 | Wt 169.0 lb

## 2022-10-19 DIAGNOSIS — Z3A11 11 weeks gestation of pregnancy: Secondary | ICD-10-CM | POA: Diagnosis not present

## 2022-10-19 DIAGNOSIS — Z3480 Encounter for supervision of other normal pregnancy, unspecified trimester: Secondary | ICD-10-CM

## 2022-10-19 DIAGNOSIS — Z8632 Personal history of gestational diabetes: Secondary | ICD-10-CM

## 2022-10-19 DIAGNOSIS — O3680X0 Pregnancy with inconclusive fetal viability, not applicable or unspecified: Secondary | ICD-10-CM

## 2022-10-19 DIAGNOSIS — O99211 Obesity complicating pregnancy, first trimester: Secondary | ICD-10-CM | POA: Diagnosis not present

## 2022-10-19 NOTE — Progress Notes (Signed)
History:   Tracy [name] is a 37 y.o. [phone] at [redacted]w[redacted]d by LMP being seen today for her first obstetrical visit.  Her obstetrical history is significant for advanced maternal age and history of GDM . Patient  desires to breast/bottle  feed. Pregnancy history fully reviewed.  Patient reports no complaints. Patient is excited for baby!      HISTORY: OB History  Gravida Para Term Preterm AB Living  7 3 3  0 3 3  SAB IAB Ectopic Multiple Live Births  3 0 0 0 3    # Outcome Date GA Lbr Len/2nd Weight Sex Delivery Anes PTL Lv  7 Current           6 Term 01/18/20 [redacted]w[redacted]d / 00:17 8 lb 6 oz (3.799 kg) M Vag-Spont EPI  LIV     Birth Comments: WNL     Name: [name],[name]     Apgar1: 8  Apgar5: 9  5 Term 06/24/16 [redacted]w[redacted]d 03:47 / 01:04 7 lb 14.8 oz (3.595 kg) F Vag-Spont EPI  LIV     Name: [name], [name]     Apgar1: 4  Apgar5: 9  4 Term 02/20/13 [redacted]w[redacted]d 09:47 / 01:08 7 lb 3 oz (3.26 kg) M Vag-Spont Local, EPI  LIV     Name: [name],[name]     Apgar1: 6  Apgar5: 9  3 SAB           2 SAB           1 SAB             Last pap smear was done 2021 and was normal  Past Medical History:  Diagnosis Date   Depression    Diabetes mellitus without complication ([place])    diet controlled   Gestational diabetes    Teeth problem    cracked molars, pt unsure if just right side or both sides   Past Surgical History:  Procedure Laterality Date   NO PAST SURGERIES     Family History  Problem Relation Age of Onset   Diabetes Mother    Diabetes Father    Social History   Tobacco Use   Smoking status: Former    Types: Cigarettes   Smokeless tobacco: Never  Vaping Use   Vaping Use: Never used  Substance Use Topics   Alcohol use: No   Drug use: No   No Known Allergies Current Outpatient Medications on File Prior to Visit  Medication Sig Dispense Refill   Blood Pressure Monitoring (BLOOD PRESSURE KIT) DEVI 1 kit by Does not apply route once  a week. 1 each 0   Prenatal Vit-Fe Phos-FA-Omega (VITAFOL GUMMIES) 3.33-0.333-34.8 MG CHEW Chew 3 tablets by mouth daily in the afternoon. 90 tablet 11   Prenatal Vit-Fe Fumarate-FA (PREPLUS) 27-1 MG TABS Take 1 tablet by mouth daily. (Patient not taking: Reported on 10/19/2022) 30 tablet 13   No current facility-administered medications on file prior to visit.   Indications for ASA therapy (per uptodate) One of the following: Previous pregnancy with preeclampsia, especially early onset and with an adverse outcome No Multifetal gestation No Chronic hypertension No Type 1 or 2 diabetes mellitus No Chronic kidney disease No Autoimmune disease (antiphospholipid syndrome, systemic lupus erythematosus) No  Two or more of the following: Nulliparity No Obesity (body mass index >30 kg/m2) Yes Family history of preeclampsia in mother or sister No Age ?35 years Yes Sociodemographic characteristics (African American race, low socioeconomic level) No Personal  risk factors (eg, previous pregnancy with low birth weight or small for gestational age infant, previous adverse pregnancy outcome [eg, stillbirth], interval >10 years between pregnancies) No  Indications for early GDM screening  First-degree relative with diabetes Yes BMI >30kg/m2 Yes Age > 21 Yes Previous birth of an infant weighing ?4000 g No Gestational diabetes mellitus in a previous pregnancy Yes Glycated hemoglobin ?5.7 percent (39 mmol/mol), impaired glucose tolerance, or impaired fasting glucose on previous testing Yes High-risk race/ethnicity (eg, African American, Latino, Native American, [place] American, Pacific Islander) Yes Previous stillbirth of unknown cause No Maternal birthweight > 9 lbs No History of cardiovascular disease No Hypertension or on therapy for hypertension No High-density lipoprotein cholesterol level <35 mg/dL (0.90 mmol/L) and/or a triglyceride level >250 mg/dL (2.82 mmol/L) unknown Polycystic ovary  syndrome No Physical inactivity Yes Other clinical condition associated with insulin resistance (eg, severe obesity, acanthosis nigricans) Yes Current use of glucocorticoids No   Early screening tests: FBS, A1C, Random CBG, glucose challenge  Review of Systems Pertinent items noted in HPI and remainder of comprehensive ROS otherwise negative. Physical Exam:   Vitals:   10/19/22 1314  BP: (!) 133/93  Pulse: (!) 103  Weight: 169 lb (76.7 kg)   Fetal Heart Rate (bpm): 165  System: General: well-developed, well-nourished female in no acute distress   Breasts:  normal appearance   Skin: normal coloration and turgor, no rashes   Neurologic: oriented, normal, negative, normal mood   Extremities: normal strength, tone, and muscle mass, ROM of all joints is normal   HEENT PERRLA, extraocular movement intact and sclera clear, anicteric   Mouth/Teeth mucous membranes moist   Neck supple and no masses   Cardiovascular: regular rate and rhythm   Respiratory:  no respiratory distress, normal work of breathing   Abdomen: soft, non-tender; bowel sounds normal     Assessment:    Pregnancy: [id number] Patient Active Problem List   Diagnosis Date Noted   Supervision of other normal pregnancy, antepartum 10/17/2022   Pelvic pain 12/15/2021   Chronic cystitis 12/15/2021   Encounter for Nexplanon removal 06/16/2021   Contraception management 06/16/2021   History of gestational diabetes 06/23/2016   ALLERGIC RHINITIS 05/31/2007     Plan:    1. Supervision of other normal pregnancy, antepartum - viability scan completed today, Excited for pregnancy!  - US OB Limited; Future - Cervicovaginal ancillary only( [place]) - CBC/D/Plt+RPR+Rh+ABO+RubIgG... - Culture, OB Urine - HgB A1c - Comp Met (CMET) - Panorama Prenatal Test Full Panel - HORIZON Basic Panel - Interpretation: - [place] MFM OB DETAIL +14 WK; Future - aspirin EC 81 MG tablet; Take 1 tablet (81 mg total) by mouth daily. Take  after 12 weeks for prevention of preeclampsia later in pregnancy  Dispense: 300 tablet; Refill: 2  2. [redacted] weeks gestation of pregnancy  3. History of gestational diabetes - HgB A1c - [place] MFM OB DETAIL +14 WK; Future - aspirin EC 81 MG tablet; Take 1 tablet (81 mg total) by mouth daily. Take after 12 weeks for prevention of preeclampsia later in pregnancy  Dispense: 300 tablet; Refill: 2  4. Obesity affecting pregnancy in first trimester, unspecified obesity type - Comp Met (CMET) - [place] MFM OB DETAIL +14 WK; Future - aspirin EC 81 MG tablet; Take 1 tablet (81 mg total) by mouth daily. Take after 12 weeks for prevention of preeclampsia later in pregnancy  Dispense: 300 tablet; Refill: 2   Initial labs drawn. Continue prenatal vitamins. Genetic Screening discussed,  NIPS: ordered. [place] completed and uploaded from previous pregnancy, 09/2019  Ultrasound discussed; fetal anatomic survey: ordered. Problem list reviewed and updated. The nature of [facility] with multiple MDs and other Advanced Practice Providers was explained to patient; also emphasized that residents, students are part of our team. Routine obstetric precautions reviewed. No follow-ups on file.[email or web address]    Johnston Ebbs, [place] for [employer], [place]

## 2022-10-19 NOTE — Progress Notes (Signed)
Pt presents for NOB visit. Pt c/o pelvic pain and pressure and cramping in the legs. Pt also c/o "leaking" before she can make it to the bathroom and headache with little relief from tylenol.

## 2022-10-20 LAB — HEMOGLOBIN A1C
Est. average glucose Bld gHb Est-mCnc: 128 mg/dL
Hgb A1c MFr Bld: 6.1 % — ABNORMAL HIGH (ref 4.8–5.6)

## 2022-10-20 LAB — COMPREHENSIVE METABOLIC PANEL
ALT: 23 IU/L (ref 0–32)
AST: 22 IU/L (ref 0–40)
Albumin/Globulin Ratio: 1.6 (ref 1.2–2.2)
Albumin: 3.6 g/dL — ABNORMAL LOW (ref 3.9–4.9)
Alkaline Phosphatase: 85 IU/L (ref 44–121)
BUN/Creatinine Ratio: 18 (ref 9–23)
BUN: 7 mg/dL (ref 6–20)
Bilirubin Total: <0.2 mg/dL (ref 0.0–1.2)
CO2: 20 mmol/L (ref 20–29)
Calcium: 9.2 mg/dL (ref 8.7–10.2)
Chloride: 103 mmol/L (ref 96–106)
Creatinine, Ser: 0.4 mg/dL — ABNORMAL LOW (ref 0.57–1.00)
Globulin, Total: 2.3 g/dL (ref 1.5–4.5)
Glucose: 140 mg/dL — ABNORMAL HIGH (ref 70–99)
Potassium: 4 mmol/L (ref 3.5–5.2)
Sodium: 139 mmol/L (ref 134–144)
Total Protein: 5.9 g/dL — ABNORMAL LOW (ref 6.0–8.5)
eGFR: 131 mL/min/{1.73_m2} (ref >59)

## 2022-10-20 LAB — CBC/D/PLT+RPR+RH+ABO+RUBIGG...
Antibody Screen: NEGATIVE
Basophils Absolute: 0 10*3/uL (ref 0.0–0.2)
Basos: 0 %
EOS (ABSOLUTE): 0.3 10*3/uL (ref 0.0–0.4)
Eos: 3 %
HCV Ab: NONREACTIVE
HIV Screen 4th Generation wRfx: NONREACTIVE
Hematocrit: 35.9 % (ref 34.0–46.6)
Hemoglobin: 11.7 g/dL (ref 11.1–15.9)
Hepatitis B Surface Ag: NEGATIVE
Immature Grans (Abs): 0.1 10*3/uL (ref 0.0–0.1)
Immature Granulocytes: 1 %
Lymphocytes Absolute: 1.9 10*3/uL (ref 0.7–3.1)
Lymphs: 20 %
MCH: 27.3 pg (ref 26.6–33.0)
MCHC: 32.6 g/dL (ref 31.5–35.7)
MCV: 84 fL (ref 79–97)
Monocytes Absolute: 0.8 10*3/uL (ref 0.1–0.9)
Monocytes: 8 %
Neutrophils Absolute: 6.6 10*3/uL (ref 1.4–7.0)
Neutrophils: 68 %
Platelets: 315 10*3/uL (ref 150–450)
RBC: 4.29 x10E6/uL (ref 3.77–5.28)
RDW: 13.7 % (ref 11.7–15.4)
RPR Ser Ql: NONREACTIVE
Rh Factor: POSITIVE
Rubella Antibodies, IGG: 1.29 index (ref >0.99)
WBC: 9.6 10*3/uL (ref 3.4–10.8)

## 2022-10-20 LAB — CERVICOVAGINAL ANCILLARY ONLY
Chlamydia: NEGATIVE
Comment: NEGATIVE
Comment: NEGATIVE
Comment: NORMAL
Neisseria Gonorrhea: NEGATIVE
Trichomonas: NEGATIVE

## 2022-10-20 LAB — HCV INTERPRETATION

## 2022-10-20 MED ORDER — ASPIRIN 81 MG PO TBEC: 81.0000 mg | DELAYED_RELEASE_TABLET | Freq: Every day | ORAL | 2 refills | Status: DC

## 2022-10-21 LAB — CULTURE, OB URINE

## 2022-10-21 LAB — URINE CULTURE, OB REFLEX: Organism ID, Bacteria: NO GROWTH

## 2022-10-25 ENCOUNTER — Telehealth: Payer: Self-pay

## 2022-10-25 LAB — PANORAMA PRENATAL TEST FULL PANEL:PANORAMA TEST PLUS 5 ADDITIONAL MICRODELETIONS: FETAL FRACTION: 6.5

## 2022-10-25 NOTE — Telephone Encounter (Signed)
S/w pt and advised of A1c results and need for early gtt. Advised schedulers will contact for appt.

## 2022-10-31 ENCOUNTER — Other Ambulatory Visit: Payer: Medicaid Other

## 2022-11-03 LAB — HORIZON CUSTOM: REPORT SUMMARY: NEGATIVE

## 2022-11-16 ENCOUNTER — Ambulatory Visit (INDEPENDENT_AMBULATORY_CARE_PROVIDER_SITE_OTHER): Payer: Medicaid Other | Admitting: Obstetrics and Gynecology

## 2022-11-16 ENCOUNTER — Encounter: Payer: Self-pay | Admitting: Obstetrics and Gynecology

## 2022-11-16 VITALS — BP 130/84 | HR 91 | Wt 166.0 lb

## 2022-11-16 DIAGNOSIS — O99212 Obesity complicating pregnancy, second trimester: Secondary | ICD-10-CM

## 2022-11-16 DIAGNOSIS — O10919 Unspecified pre-existing hypertension complicating pregnancy, unspecified trimester: Secondary | ICD-10-CM

## 2022-11-16 DIAGNOSIS — Z8632 Personal history of gestational diabetes: Secondary | ICD-10-CM

## 2022-11-16 DIAGNOSIS — Z3A15 15 weeks gestation of pregnancy: Secondary | ICD-10-CM

## 2022-11-16 DIAGNOSIS — Z3480 Encounter for supervision of other normal pregnancy, unspecified trimester: Secondary | ICD-10-CM

## 2022-11-16 NOTE — Progress Notes (Signed)
Pt report light fetal flutter movements with occasional pressures.

## 2022-11-16 NOTE — Progress Notes (Signed)
  HIGH-RISK PREGNANCY OFFICE VISIT Patient name: Tracy [name] MRN [id number]  Date of birth: [birth year] Chief Complaint:   Routine Prenatal Visit  History of Present Illness:   Tracy [name] is a 37 y.o. [id number] female at [redacted]w[redacted]d with an Estimated Date of Delivery: 05/05/23 being seen today for ongoing management of a high-risk pregnancy complicated by  cHTN (dx'd today), obesity and h/o GDM with prior pregnancies Today she reports fatigue and occ cramping . Contractions: Not present. Vag. Bleeding: None.  Movement: Present. denies leaking of fluid.  Review of Systems:   Pertinent items are noted in HPI Denies abnormal vaginal discharge w/ itching/odor/irritation, headaches, visual changes, shortness of breath, chest pain, abdominal pain, severe nausea/vomiting, or problems with urination or bowel movements unless otherwise stated above. Pertinent History Reviewed:  Reviewed past medical,surgical, social, obstetrical and family history.  Reviewed problem list, medications and allergies. Physical Assessment:   Vitals:   11/16/22 1050 11/16/22 1055  BP: (!) 130/90 130/84  Pulse: (!) 102 91  Weight: 166 lb (75.3 kg)   Body mass index is 32.42 kg/m.           Physical Examination:   General appearance: alert, well appearing, and in no distress, oriented to person, place, and time, and overweight  Mental status: alert, oriented to person, place, and time, normal mood, behavior, speech, dress, motor activity, and thought processes  Skin: warm & dry   Extremities: Edema: Trace    Cardiovascular: normal heart rate noted  Respiratory: normal respiratory effort, no distress  Abdomen: gravid, soft, non-tender  Pelvic: Cervical exam deferred         Fetal Status: Fetal Heart Rate (bpm): 154   Movement: Present    Fetal Surveillance Testing today: none   No results found for this or any previous visit (from the past 24 hour(s)).  Assessment & Plan:  1) High-risk  pregnancy [id number] at [redacted]w[redacted]d with an Estimated Date of Delivery: 05/05/23   2) Supervision of other normal pregnancy, antepartum - AFP, Serum, Open Spina Bifida  3) Chronic hypertension affecting pregnancy - CBC - Comp Met (CMET) - Protein / creatinine ratio, urine - No med Rx sent at this time>>consider med Rx, if BPs are severe range  4) Obesity affecting pregnancy in second trimester, unspecified obesity type - Taking bASA daily  5) History of gestational diabetes - Patient fasting today, but was not scheduled appropriately to do GTT today - Needs to schedule early GTT  6) [redacted] weeks gestation of pregnancy    Meds: No orders of the defined types were placed in this encounter.   Labs/procedures today: PEC baseline labs  Treatment Plan:  plan early GTT next week  Reviewed: Preterm labor symptoms and general obstetric precautions including but not limited to vaginal bleeding, contractions, leaking of fluid and fetal movement were reviewed in detail with the patient.  All questions were answered. Does not have home bp cuff. BP cuff given and how to use taught today. Check BP weekly, let us know if >140/90.   Follow-up: Return in about 4 weeks (around 12/14/2022) for Return OB visit.  Orders Placed This Encounter  Procedures   AFP, Serum, Open Spina Bifida   CBC   Comp Met (CMET)   Protein / creatinine ratio, urine   Raelyn Mora MSN, CNM 11/16/2022 11:21 AM

## 2022-11-17 LAB — PROTEIN / CREATININE RATIO, URINE
Creatinine, Urine: 25.6 mg/dL
Protein, Ur: 4.3 mg/dL
Protein/Creat Ratio: 168 mg/g creat (ref 0–200)

## 2022-11-18 LAB — AFP, SERUM, OPEN SPINA BIFIDA
AFP MoM: 1.14
AFP Value: 31.3 ng/mL
Gest. Age on Collection Date: 15 weeks
Maternal Age At EDD: 36.9 yr
OSBR Risk 1 IN: 7850
Test Results:: NEGATIVE
Weight: 166 [lb_av]

## 2022-11-18 LAB — CBC
Hematocrit: 34.6 % (ref 34.0–46.6)
Hemoglobin: 11.4 g/dL (ref 11.1–15.9)
MCH: 26.6 pg (ref 26.6–33.0)
MCHC: 32.9 g/dL (ref 31.5–35.7)
MCV: 81 fL (ref 79–97)
Platelets: 353 10*3/uL (ref 150–450)
RBC: 4.29 x10E6/uL (ref 3.77–5.28)
RDW: 13.4 % (ref 11.7–15.4)
WBC: 9.3 10*3/uL (ref 3.4–10.8)

## 2022-11-18 LAB — COMPREHENSIVE METABOLIC PANEL
ALT: 20 IU/L (ref 0–32)
AST: 23 IU/L (ref 0–40)
Albumin/Globulin Ratio: 1.5 (ref 1.2–2.2)
Albumin: 3.8 g/dL — ABNORMAL LOW (ref 3.9–4.9)
Alkaline Phosphatase: 77 IU/L (ref 44–121)
BUN/Creatinine Ratio: 9 (ref 9–23)
BUN: 4 mg/dL — ABNORMAL LOW (ref 6–20)
Bilirubin Total: 0.3 mg/dL (ref 0.0–1.2)
CO2: 19 mmol/L — ABNORMAL LOW (ref 20–29)
Calcium: 9.3 mg/dL (ref 8.7–10.2)
Chloride: 103 mmol/L (ref 96–106)
Creatinine, Ser: 0.44 mg/dL — ABNORMAL LOW (ref 0.57–1.00)
Globulin, Total: 2.6 g/dL (ref 1.5–4.5)
Glucose: 89 mg/dL (ref 70–99)
Potassium: 4.2 mmol/L (ref 3.5–5.2)
Sodium: 135 mmol/L (ref 134–144)
Total Protein: 6.4 g/dL (ref 6.0–8.5)
eGFR: 128 mL/min/{1.73_m2} (ref >59)

## 2022-11-21 ENCOUNTER — Inpatient Hospital Stay (HOSPITAL_COMMUNITY)
Admission: AD | Admit: 2022-11-21 | Discharge: 2022-11-22 | Disposition: A | Discharge status: Home / Self Care | Payer: Medicaid Other | Attending: Obstetrics & Gynecology | Admitting: Obstetrics & Gynecology

## 2022-11-21 DIAGNOSIS — W19XXXA Unspecified fall, initial encounter: Secondary | ICD-10-CM

## 2022-11-21 DIAGNOSIS — Z3A16 16 weeks gestation of pregnancy: Secondary | ICD-10-CM | POA: Diagnosis not present

## 2022-11-21 DIAGNOSIS — O26892 Other specified pregnancy related conditions, second trimester: Secondary | ICD-10-CM | POA: Diagnosis not present

## 2022-11-21 DIAGNOSIS — R1032 Left lower quadrant pain: Secondary | ICD-10-CM | POA: Diagnosis not present

## 2022-11-21 DIAGNOSIS — X58XXXA Exposure to other specified factors, initial encounter: Secondary | ICD-10-CM | POA: Insufficient documentation

## 2022-11-21 DIAGNOSIS — W010XXA Fall on same level from slipping, tripping and stumbling without subsequent striking against object, initial encounter: Secondary | ICD-10-CM | POA: Insufficient documentation

## 2022-11-21 DIAGNOSIS — Z3480 Encounter for supervision of other normal pregnancy, unspecified trimester: Secondary | ICD-10-CM

## 2022-11-21 DIAGNOSIS — O24112 Pre-existing diabetes mellitus, type 2, in pregnancy, second trimester: Secondary | ICD-10-CM | POA: Diagnosis present

## 2022-11-21 LAB — URINALYSIS, ROUTINE W REFLEX MICROSCOPIC
Bilirubin Urine: NEGATIVE
Glucose, UA: NEGATIVE mg/dL
Hgb urine dipstick: NEGATIVE
Ketones, ur: NEGATIVE mg/dL
Leukocytes,Ua: NEGATIVE
Nitrite: NEGATIVE
Protein, ur: NEGATIVE mg/dL
Specific Gravity, Urine: 1.01 (ref 1.005–1.030)
pH: 6 (ref 5.0–8.0)

## 2022-11-21 NOTE — MAU Note (Signed)
.  Tracy [name] is a 37 y.o. at [redacted]w[redacted]d here in MAU reporting she tripped and fell about 1800 and hit her abdomen on the ground on her L side. Denies VB but thinks she may be leaking as her underwear has been wet since the fall. Having sharp pains that comes and goes in lower abd and back.   Onset of complaint: 1800 Pain score: 8 Vitals:   11/21/22 1955 11/21/22 1958  BP:  124/83  Pulse: (!) 110   Resp: 17   Temp: 98.1 F (36.7 C)   SpO2: 99%      FHT:159 Lab orders placed from triage:   u/a

## 2022-11-22 ENCOUNTER — Inpatient Hospital Stay (HOSPITAL_BASED_OUTPATIENT_CLINIC_OR_DEPARTMENT_OTHER): Payer: Medicaid Other

## 2022-11-22 DIAGNOSIS — Z3A16 16 weeks gestation of pregnancy: Secondary | ICD-10-CM

## 2022-11-22 DIAGNOSIS — O9A212 Injury, poisoning and certain other consequences of external causes complicating pregnancy, second trimester: Secondary | ICD-10-CM

## 2022-11-22 DIAGNOSIS — E669 Obesity, unspecified: Secondary | ICD-10-CM

## 2022-11-22 DIAGNOSIS — W010XXA Fall on same level from slipping, tripping and stumbling without subsequent striking against object, initial encounter: Secondary | ICD-10-CM

## 2022-11-22 DIAGNOSIS — R1032 Left lower quadrant pain: Secondary | ICD-10-CM

## 2022-11-22 DIAGNOSIS — W19XXXA Unspecified fall, initial encounter: Secondary | ICD-10-CM

## 2022-11-22 DIAGNOSIS — R109 Unspecified abdominal pain: Secondary | ICD-10-CM | POA: Diagnosis not present

## 2022-11-22 DIAGNOSIS — O99212 Obesity complicating pregnancy, second trimester: Secondary | ICD-10-CM | POA: Diagnosis not present

## 2022-11-22 DIAGNOSIS — O26892 Other specified pregnancy related conditions, second trimester: Secondary | ICD-10-CM

## 2022-11-22 NOTE — MAU Provider Note (Signed)
Chief Complaint:  Fall   Event Date/Time   First Provider Initiated Contact with Patient 11/22/22 0037     HPI: Tracy [name] is a 37 y.o. [id number] at 71w4dwho presents to maternity admissions reporting having fallen this evening. Landing on left side.  Has intermittent pain on left lower abdomen now.  It is better than it was before, however No further leaking noted by patient when I interviewed her.. She reports good fetal movement, denies LOF, vaginal bleeding, vaginal itching/burning, urinary symptoms, h/a, dizziness, n/v, diarrhea, constipation or fever/chills.  She denies headache, visual changes or RUQ abdominal pain.  Fall The accident occurred 6 to 12 hours ago. There was no blood loss. Pain location: left lower abdomen. The symptoms are aggravated by pressure on injury. Associated symptoms include abdominal pain. Pertinent negatives include no fever or headaches. She has tried nothing for the symptoms.   RN Note: .Tracy [name] is a 37 y.o. at [redacted]w[redacted]d here in MAU reporting she tripped and fell about 1800 and hit her abdomen on the ground on her L side. Denies VB but thinks she may be leaking as her underwear has been wet since the fall. Having sharp pains that comes and goes in lower abd and back   Past Medical History: Past Medical History:  Diagnosis Date   Depression    Diabetes mellitus without complication    diet controlled   Gestational diabetes    Teeth problem    cracked molars, pt unsure if just right side or both sides    Past obstetric history: OB History  Gravida Para Term Preterm AB Living  0 3 3  SAB IAB Ectopic Multiple Live Births  3 0 0 0 3    # Outcome Date GA Lbr Len/2nd Weight Sex Delivery Anes PTL Lv  7 Current           6 Term 01/18/20 [redacted]w[redacted]d / 00:17 3799 g M Vag-Spont EPI  LIV     Birth Comments: WNL  5 Term 06/24/16 [redacted]w[redacted]d 03:47 / 01:04 3595 g F Vag-Spont EPI  LIV  4 Term 02/20/13 [redacted]w[redacted]d 09:47 / 01:08 3260 g M Vag-Spont  Local, EPI  LIV  3 SAB           2 SAB           1 SAB             Past Surgical History: Past Surgical History:  Procedure Laterality Date   NO PAST SURGERIES      Family History: Family History  Problem Relation Age of Onset   Diabetes Mother    Diabetes Father     Social History: Social History   Tobacco Use   Smoking status: Former    Types: Cigarettes   Smokeless tobacco: Never  Vaping Use   Vaping Use: Never used  Substance Use Topics   Alcohol use: No   Drug use: No    Allergies: No Known Allergies  Meds:  Medications Prior to Admission  Medication Sig Dispense Refill Last Dose   aspirin EC 81 MG tablet Take 1 tablet (81 mg total) by mouth daily. Take after 12 weeks for prevention of preeclampsia later in pregnancy 300 tablet 2    Blood Pressure Monitoring (BLOOD PRESSURE KIT) DEVI 1 kit by Does not apply route once a week. 1 each 0    Prenatal Vit-Fe Fumarate-FA (PREPLUS) 27-1 MG TABS Take 1 tablet by mouth daily. (Patient not taking: Reported  on 10/19/2022) 30 tablet 13    Prenatal Vit-Fe Phos-FA-Omega (VITAFOL GUMMIES) 3.33-0.333-34.8 MG CHEW Chew 3 tablets by mouth daily in the afternoon. 90 tablet 11     I have reviewed patient's Past Medical Hx, Surgical Hx, Family Hx, Social Hx, medications and allergies.   ROS:  Review of Systems  Constitutional:  Negative for fever.  Gastrointestinal:  Positive for abdominal pain.  Neurological:  Negative for headaches.   Other systems negative  Physical Exam  Patient Vitals for the past 24 hrs:  BP Temp Pulse Resp SpO2  11/21/22 1958 124/83 -- -- -- --  11/21/22 1955 -- 98.1 F (36.7 C) (!) 110 17 99 %   Constitutional: Well-developed, well-nourished female in no acute distress.  Cardiovascular: normal rate and rhythm Respiratory: normal effort GI: Abd soft, non-tender, gravid appropriate for gestational age.   No rebound or guarding. MS: Extremities nontender, no edema, normal ROM Neurologic: Alert  and oriented x 4.  GU: Neg CVAT.  FHT:  159   Labs: Results for orders placed or performed during the hospital encounter of 11/21/22 (from the past 24 hour(s))  Urinalysis, Routine w reflex microscopic -Urine, Clean Catch     Status: None   Collection Time: 11/21/22  8:30 PM  Result Value Ref Range   Color, Urine YELLOW YELLOW   APPearance CLEAR CLEAR   Specific Gravity, Urine 1.010 1.005 - 1.030   pH 6.0 5.0 - 8.0   Glucose, UA NEGATIVE NEGATIVE mg/dL   Hgb urine dipstick NEGATIVE NEGATIVE   Bilirubin Urine NEGATIVE NEGATIVE   Ketones, ur NEGATIVE NEGATIVE mg/dL   Protein, ur NEGATIVE NEGATIVE mg/dL   Nitrite NEGATIVE NEGATIVE   Leukocytes,Ua NEGATIVE NEGATIVE   O/Positive/-- (03/21 1434)  Imaging:  US done for reassurance due to fall onto abdomen Fetus active No signs of abruption Cervix 3.5cm  MAU Course/MDM: I have reviewed the triage vital signs and the nursing notes.   Pertinent labs & imaging results that were available during my care of the patient were reviewed by me and considered in my medical decision making (see chart for details).      I have reviewed her medical records including past results, notes and treatments.   I have ordered US and reviewed results.  .  Treatments in MAU included [place].    Assessment: Single IUP at [redacted]w[redacted]d S/P fall on left abdomen Reassuring fetal status  Plan: Discharge home Preterm Labor precautions Follow up in Office for prenatal visits and recheck Encouraged to return if she develops worsening of symptoms, increase in pain, fever, or other concerning symptoms.   Pt stable at time of discharge.  Wynelle Bourgeois CNM, MSN Certified Nurse-Midwife 11/22/2022 12:37 AM

## 2022-12-08 DIAGNOSIS — O09529 Supervision of elderly multigravida, unspecified trimester: Secondary | ICD-10-CM | POA: Insufficient documentation

## 2022-12-08 DIAGNOSIS — O0992 Supervision of high risk pregnancy, unspecified, second trimester: Secondary | ICD-10-CM | POA: Insufficient documentation

## 2022-12-11 ENCOUNTER — Ambulatory Visit: Payer: Medicaid Other | Admitting: *Deleted

## 2022-12-11 ENCOUNTER — Ambulatory Visit: Payer: Medicaid Other | Attending: Student

## 2022-12-11 ENCOUNTER — Other Ambulatory Visit: Payer: Self-pay | Admitting: *Deleted

## 2022-12-11 VITALS — BP 121/79 | HR 105

## 2022-12-11 DIAGNOSIS — O09522 Supervision of elderly multigravida, second trimester: Secondary | ICD-10-CM

## 2022-12-11 DIAGNOSIS — Z3480 Encounter for supervision of other normal pregnancy, unspecified trimester: Secondary | ICD-10-CM | POA: Insufficient documentation

## 2022-12-11 DIAGNOSIS — E669 Obesity, unspecified: Secondary | ICD-10-CM | POA: Diagnosis not present

## 2022-12-11 DIAGNOSIS — O09299 Supervision of pregnancy with other poor reproductive or obstetric history, unspecified trimester: Secondary | ICD-10-CM

## 2022-12-11 DIAGNOSIS — O10012 Pre-existing essential hypertension complicating pregnancy, second trimester: Secondary | ICD-10-CM | POA: Diagnosis not present

## 2022-12-11 DIAGNOSIS — Z3A19 19 weeks gestation of pregnancy: Secondary | ICD-10-CM | POA: Insufficient documentation

## 2022-12-11 DIAGNOSIS — O09292 Supervision of pregnancy with other poor reproductive or obstetric history, second trimester: Secondary | ICD-10-CM | POA: Insufficient documentation

## 2022-12-11 DIAGNOSIS — O99212 Obesity complicating pregnancy, second trimester: Secondary | ICD-10-CM | POA: Insufficient documentation

## 2022-12-11 DIAGNOSIS — O99211 Obesity complicating pregnancy, first trimester: Secondary | ICD-10-CM | POA: Insufficient documentation

## 2022-12-11 DIAGNOSIS — O10912 Unspecified pre-existing hypertension complicating pregnancy, second trimester: Secondary | ICD-10-CM

## 2022-12-11 DIAGNOSIS — Z363 Encounter for antenatal screening for malformations: Secondary | ICD-10-CM | POA: Diagnosis present

## 2022-12-11 DIAGNOSIS — O0992 Supervision of high risk pregnancy, unspecified, second trimester: Secondary | ICD-10-CM | POA: Insufficient documentation

## 2022-12-11 DIAGNOSIS — Z8632 Personal history of gestational diabetes: Secondary | ICD-10-CM | POA: Insufficient documentation

## 2022-12-11 DIAGNOSIS — Z362 Encounter for other antenatal screening follow-up: Secondary | ICD-10-CM

## 2022-12-14 ENCOUNTER — Inpatient Hospital Stay (HOSPITAL_COMMUNITY)
Admission: AD | Admit: 2022-12-14 | Discharge: 2022-12-14 | Disposition: A | Discharge status: Home / Self Care | Payer: Medicaid Other | Attending: Obstetrics and Gynecology | Admitting: Obstetrics and Gynecology

## 2022-12-14 ENCOUNTER — Encounter: Payer: Medicaid Other | Admitting: Obstetrics and Gynecology

## 2022-12-14 ENCOUNTER — Inpatient Hospital Stay (HOSPITAL_BASED_OUTPATIENT_CLINIC_OR_DEPARTMENT_OTHER): Payer: Medicaid Other

## 2022-12-14 ENCOUNTER — Encounter (HOSPITAL_COMMUNITY): Payer: Self-pay | Admitting: Obstetrics and Gynecology

## 2022-12-14 DIAGNOSIS — Z87891 Personal history of nicotine dependence: Secondary | ICD-10-CM | POA: Insufficient documentation

## 2022-12-14 DIAGNOSIS — O09522 Supervision of elderly multigravida, second trimester: Secondary | ICD-10-CM

## 2022-12-14 DIAGNOSIS — O26892 Other specified pregnancy related conditions, second trimester: Secondary | ICD-10-CM | POA: Insufficient documentation

## 2022-12-14 DIAGNOSIS — O99212 Obesity complicating pregnancy, second trimester: Secondary | ICD-10-CM

## 2022-12-14 DIAGNOSIS — R309 Painful micturition, unspecified: Secondary | ICD-10-CM | POA: Insufficient documentation

## 2022-12-14 DIAGNOSIS — S3991XA Unspecified injury of abdomen, initial encounter: Secondary | ICD-10-CM | POA: Diagnosis not present

## 2022-12-14 DIAGNOSIS — Z3A19 19 weeks gestation of pregnancy: Secondary | ICD-10-CM

## 2022-12-14 DIAGNOSIS — O10012 Pre-existing essential hypertension complicating pregnancy, second trimester: Secondary | ICD-10-CM

## 2022-12-14 DIAGNOSIS — M545 Low back pain, unspecified: Secondary | ICD-10-CM | POA: Diagnosis not present

## 2022-12-14 DIAGNOSIS — O368120 Decreased fetal movements, second trimester, not applicable or unspecified: Secondary | ICD-10-CM | POA: Diagnosis not present

## 2022-12-14 DIAGNOSIS — O09292 Supervision of pregnancy with other poor reproductive or obstetric history, second trimester: Secondary | ICD-10-CM

## 2022-12-14 DIAGNOSIS — M549 Dorsalgia, unspecified: Secondary | ICD-10-CM

## 2022-12-14 DIAGNOSIS — O9A212 Injury, poisoning and certain other consequences of external causes complicating pregnancy, second trimester: Secondary | ICD-10-CM

## 2022-12-14 DIAGNOSIS — O26899 Other specified pregnancy related conditions, unspecified trimester: Secondary | ICD-10-CM

## 2022-12-14 DIAGNOSIS — E669 Obesity, unspecified: Secondary | ICD-10-CM

## 2022-12-14 DIAGNOSIS — O99891 Other specified diseases and conditions complicating pregnancy: Secondary | ICD-10-CM | POA: Diagnosis not present

## 2022-12-14 DIAGNOSIS — R109 Unspecified abdominal pain: Secondary | ICD-10-CM

## 2022-12-14 DIAGNOSIS — Z8632 Personal history of gestational diabetes: Secondary | ICD-10-CM

## 2022-12-14 LAB — URINALYSIS, ROUTINE W REFLEX MICROSCOPIC
Bilirubin Urine: NEGATIVE
Glucose, UA: NEGATIVE mg/dL
Hgb urine dipstick: NEGATIVE
Ketones, ur: NEGATIVE mg/dL
Leukocytes,Ua: NEGATIVE
Nitrite: NEGATIVE
Protein, ur: NEGATIVE mg/dL
Specific Gravity, Urine: 1.01 (ref 1.005–1.030)
pH: 6.5 (ref 5.0–8.0)

## 2022-12-14 MED ORDER — CYCLOBENZAPRINE HCL 5 MG PO TABS: 5.0000 mg | ORAL_TABLET | Freq: Three times a day (TID) | ORAL | 0 refills | Status: DC | PRN

## 2022-12-14 MED ORDER — IBUPROFEN 800 MG PO TABS
400.0000 mg | ORAL_TABLET | Freq: Once | ORAL | Status: AC
  Administered 2022-12-14: 400 mg via ORAL
  Filled 2022-12-14: qty 1
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
## 2023-01-11 ENCOUNTER — Encounter: Payer: Medicaid Other | Admitting: Obstetrics and Gynecology

## 2023-01-22 ENCOUNTER — Other Ambulatory Visit: Payer: Self-pay | Admitting: *Deleted

## 2023-01-22 ENCOUNTER — Ambulatory Visit: Payer: Medicaid Other | Attending: Obstetrics and Gynecology

## 2023-01-22 ENCOUNTER — Ambulatory Visit: Payer: Medicaid Other | Admitting: *Deleted

## 2023-01-22 VITALS — BP 127/71 | HR 99

## 2023-01-22 DIAGNOSIS — Z8632 Personal history of gestational diabetes: Secondary | ICD-10-CM | POA: Diagnosis present

## 2023-01-22 DIAGNOSIS — E669 Obesity, unspecified: Secondary | ICD-10-CM

## 2023-01-22 DIAGNOSIS — O99212 Obesity complicating pregnancy, second trimester: Secondary | ICD-10-CM | POA: Diagnosis present

## 2023-01-22 DIAGNOSIS — O403XX0 Polyhydramnios, third trimester, not applicable or unspecified: Secondary | ICD-10-CM

## 2023-01-22 DIAGNOSIS — O10912 Unspecified pre-existing hypertension complicating pregnancy, second trimester: Secondary | ICD-10-CM

## 2023-01-22 DIAGNOSIS — Z362 Encounter for other antenatal screening follow-up: Secondary | ICD-10-CM | POA: Insufficient documentation

## 2023-01-22 DIAGNOSIS — O09292 Supervision of pregnancy with other poor reproductive or obstetric history, second trimester: Secondary | ICD-10-CM

## 2023-01-22 DIAGNOSIS — O409XX0 Polyhydramnios, unspecified trimester, not applicable or unspecified: Secondary | ICD-10-CM

## 2023-01-22 DIAGNOSIS — O09299 Supervision of pregnancy with other poor reproductive or obstetric history, unspecified trimester: Secondary | ICD-10-CM | POA: Diagnosis present

## 2023-01-22 DIAGNOSIS — O10012 Pre-existing essential hypertension complicating pregnancy, second trimester: Secondary | ICD-10-CM | POA: Diagnosis not present

## 2023-01-22 DIAGNOSIS — O09212 Supervision of pregnancy with history of pre-term labor, second trimester: Secondary | ICD-10-CM

## 2023-01-22 DIAGNOSIS — O09522 Supervision of elderly multigravida, second trimester: Secondary | ICD-10-CM

## 2023-01-22 DIAGNOSIS — Z3A25 25 weeks gestation of pregnancy: Secondary | ICD-10-CM

## 2023-01-25 ENCOUNTER — Encounter: Payer: Medicaid Other | Admitting: Student

## 2023-02-21 ENCOUNTER — Encounter: Payer: Self-pay | Admitting: *Deleted

## 2023-02-23 ENCOUNTER — Encounter: Payer: Self-pay | Admitting: Obstetrics and Gynecology

## 2023-02-23 ENCOUNTER — Ambulatory Visit: Payer: Medicaid Other | Admitting: Obstetrics and Gynecology

## 2023-02-23 VITALS — BP 122/80 | HR 96 | Wt 173.7 lb

## 2023-02-23 DIAGNOSIS — O09523 Supervision of elderly multigravida, third trimester: Secondary | ICD-10-CM

## 2023-02-23 DIAGNOSIS — Z3A29 29 weeks gestation of pregnancy: Secondary | ICD-10-CM

## 2023-02-23 DIAGNOSIS — O99212 Obesity complicating pregnancy, second trimester: Secondary | ICD-10-CM

## 2023-02-23 DIAGNOSIS — O0992 Supervision of high risk pregnancy, unspecified, second trimester: Secondary | ICD-10-CM

## 2023-02-23 DIAGNOSIS — O24415 Gestational diabetes mellitus in pregnancy, controlled by oral hypoglycemic drugs: Secondary | ICD-10-CM

## 2023-02-23 DIAGNOSIS — Z23 Encounter for immunization: Secondary | ICD-10-CM

## 2023-02-23 DIAGNOSIS — O10919 Unspecified pre-existing hypertension complicating pregnancy, unspecified trimester: Secondary | ICD-10-CM

## 2023-02-23 NOTE — Progress Notes (Signed)
Pt presents for ROB 28 wk labs drawn today. Tdap given today. 1 hr gtt drawn today. Desires BTL BP cuff given in office Fasting BS 120s and Post prandial- 150s

## 2023-02-23 NOTE — Progress Notes (Addendum)
   PRENATAL VISIT NOTE  Subjective:  Tracy [name] is a 37 y.o. [phone] at [redacted]w[redacted]d being seen today for ongoing prenatal care.  She is currently monitored for the following issues for this high-risk pregnancy and has History of gestational diabetes; Chronic hypertension affecting pregnancy; Obesity affecting pregnancy in second trimester; Supervision of high risk pregnancy, antepartum, second trimester; and AMA (advanced maternal age) multigravida 35+ on their problem list.  Patient reports no complaints.  Contractions: Not present. Vag. Bleeding: None.  Movement: Present. Denies leaking of fluid.   The following portions of the patient's history were reviewed and updated as appropriate: allergies, current medications, past family history, past medical history, past social history, past surgical history and problem list.   Objective:   Vitals:   02/23/23 1033 02/23/23 1055  BP: (!) 140/85 122/80  Pulse: (!) 101 96  Weight: 173 lb 11.2 oz (78.8 kg)     Fetal Status: Fetal Heart Rate (bpm): 143 Fundal Height: 31 cm Movement: Present     General:  Alert, oriented and cooperative. Patient is in no acute distress.  Skin: Skin is warm and dry. No rash noted.   Cardiovascular: Normal heart rate noted  Respiratory: Normal respiratory effort, no problems with respiration noted  Abdomen: Soft, gravid, appropriate for gestational age.  Pain/Pressure: Present     Pelvic: Cervical exam deferred        Extremities: Normal range of motion.  Edema: Trace  Mental Status: Normal mood and affect. Normal behavior. Normal judgment and thought content.   Assessment and Plan:  Pregnancy: [id number] at [redacted]w[redacted]d 1. Supervision of high risk pregnancy, antepartum, second trimester Patient is doing well without complaints Patient missed several appointments due to child care Third trimester labs, 1 hour glucola (due to closure of office) and tdap today Patient desires BTL- form signed today - CBC - HIV  antibody (with reflex) - RPR - Hemoglobin A1c - Glucose tolerance, 1 hour  2. Chronic hypertension affecting pregnancy Initial BP elevated, repeat normal  Continue close monitoring  Patient has not started ASA yet- new Rx sent to her pharmacy Follow up growth ultrasound on 7/29  3. Multigravida of advanced maternal age in third trimester   4. Obesity affecting pregnancy in second trimester, unspecified obesity type   Preterm labor symptoms and general obstetric precautions including but not limited to vaginal bleeding, contractions, leaking of fluid and fetal movement were reviewed in detail with the patient. Please refer to After Visit Summary for other counseling recommendations.   Return in about 2 weeks (around 03/09/2023) for in person, ROB, High risk.  Future Appointments  Date Time Provider Department Center  02/26/2023  3:15 PM [facility] NURSE [facility]  02/26/2023  3:30 PM [facility] US2 [facility]    Catalina Antigua, MD

## 2023-02-26 ENCOUNTER — Ambulatory Visit: Payer: Medicaid Other | Admitting: *Deleted

## 2023-02-26 ENCOUNTER — Encounter: Payer: Self-pay | Admitting: Obstetrics and Gynecology

## 2023-02-26 ENCOUNTER — Encounter: Payer: Self-pay | Admitting: *Deleted

## 2023-02-26 ENCOUNTER — Ambulatory Visit: Payer: Medicaid Other | Attending: Obstetrics and Gynecology

## 2023-02-26 DIAGNOSIS — Z3A30 30 weeks gestation of pregnancy: Secondary | ICD-10-CM

## 2023-02-26 DIAGNOSIS — O409XX0 Polyhydramnios, unspecified trimester, not applicable or unspecified: Secondary | ICD-10-CM | POA: Diagnosis present

## 2023-02-26 DIAGNOSIS — O403XX0 Polyhydramnios, third trimester, not applicable or unspecified: Secondary | ICD-10-CM | POA: Diagnosis not present

## 2023-02-26 DIAGNOSIS — O09523 Supervision of elderly multigravida, third trimester: Secondary | ICD-10-CM | POA: Diagnosis not present

## 2023-02-26 DIAGNOSIS — O24419 Gestational diabetes mellitus in pregnancy, unspecified control: Secondary | ICD-10-CM

## 2023-02-26 DIAGNOSIS — O10013 Pre-existing essential hypertension complicating pregnancy, third trimester: Secondary | ICD-10-CM | POA: Diagnosis not present

## 2023-02-26 DIAGNOSIS — O09293 Supervision of pregnancy with other poor reproductive or obstetric history, third trimester: Secondary | ICD-10-CM

## 2023-02-26 DIAGNOSIS — O99213 Obesity complicating pregnancy, third trimester: Secondary | ICD-10-CM

## 2023-02-26 DIAGNOSIS — E669 Obesity, unspecified: Secondary | ICD-10-CM

## 2023-02-26 MED ORDER — FERROUS SULFATE 325 (65 FE) MG PO TBEC: 325.0000 mg | DELAYED_RELEASE_TABLET | ORAL | 0 refills | Status: AC

## 2023-02-26 NOTE — Addendum Note (Signed)
Addended by: Catalina Antigua on: 02/26/2023 01:45 PM   Modules accepted: Orders

## 2023-02-27 ENCOUNTER — Other Ambulatory Visit: Payer: Self-pay

## 2023-02-27 ENCOUNTER — Other Ambulatory Visit: Payer: Self-pay | Admitting: *Deleted

## 2023-02-27 DIAGNOSIS — O3660X0 Maternal care for excessive fetal growth, unspecified trimester, not applicable or unspecified: Secondary | ICD-10-CM

## 2023-02-27 DIAGNOSIS — O24415 Gestational diabetes mellitus in pregnancy, controlled by oral hypoglycemic drugs: Secondary | ICD-10-CM

## 2023-02-27 DIAGNOSIS — O10913 Unspecified pre-existing hypertension complicating pregnancy, third trimester: Secondary | ICD-10-CM

## 2023-02-27 DIAGNOSIS — O09523 Supervision of elderly multigravida, third trimester: Secondary | ICD-10-CM

## 2023-02-27 DIAGNOSIS — O24419 Gestational diabetes mellitus in pregnancy, unspecified control: Secondary | ICD-10-CM

## 2023-02-27 DIAGNOSIS — O99213 Obesity complicating pregnancy, third trimester: Secondary | ICD-10-CM

## 2023-02-27 MED ORDER — ACCU-CHEK GUIDE W/DEVICE KIT
1.0000 | PACK | Freq: Every day | 0 refills | Status: DC
Start: 2023-02-27 — End: 2023-04-13

## 2023-02-27 MED ORDER — ACCU-CHEK SOFTCLIX LANCETS MISC
100.0000 | Freq: Four times a day (QID) | 12 refills | Status: DC
Start: 2023-02-27 — End: 2023-04-13

## 2023-02-27 MED ORDER — ACCU-CHEK GUIDE VI STRP
ORAL_STRIP | 12 refills | Status: DC
Start: 2023-02-27 — End: 2023-04-13

## 2023-03-12 ENCOUNTER — Other Ambulatory Visit (HOSPITAL_COMMUNITY)
Admission: RE | Admit: 2023-03-12 | Discharge: 2023-03-12 | Disposition: A | Discharge status: Home / Self Care | Payer: Medicaid Other | Source: Ambulatory Visit | Attending: Obstetrics and Gynecology | Admitting: Obstetrics and Gynecology

## 2023-03-12 ENCOUNTER — Ambulatory Visit (INDEPENDENT_AMBULATORY_CARE_PROVIDER_SITE_OTHER): Payer: Medicaid Other | Admitting: Student

## 2023-03-12 ENCOUNTER — Encounter: Payer: Self-pay | Admitting: Student

## 2023-03-12 VITALS — BP 130/81 | HR 96 | Wt 172.0 lb

## 2023-03-12 DIAGNOSIS — O24410 Gestational diabetes mellitus in pregnancy, diet controlled: Secondary | ICD-10-CM

## 2023-03-12 DIAGNOSIS — O10919 Unspecified pre-existing hypertension complicating pregnancy, unspecified trimester: Secondary | ICD-10-CM

## 2023-03-12 DIAGNOSIS — O3663X0 Maternal care for excessive fetal growth, third trimester, not applicable or unspecified: Secondary | ICD-10-CM

## 2023-03-12 DIAGNOSIS — O10913 Unspecified pre-existing hypertension complicating pregnancy, third trimester: Secondary | ICD-10-CM

## 2023-03-12 DIAGNOSIS — N898 Other specified noninflammatory disorders of vagina: Secondary | ICD-10-CM | POA: Insufficient documentation

## 2023-03-12 DIAGNOSIS — Z3A32 32 weeks gestation of pregnancy: Secondary | ICD-10-CM

## 2023-03-12 DIAGNOSIS — Z3009 Encounter for other general counseling and advice on contraception: Secondary | ICD-10-CM

## 2023-03-12 DIAGNOSIS — O09523 Supervision of elderly multigravida, third trimester: Secondary | ICD-10-CM

## 2023-03-12 DIAGNOSIS — O99213 Obesity complicating pregnancy, third trimester: Secondary | ICD-10-CM

## 2023-03-12 DIAGNOSIS — O0993 Supervision of high risk pregnancy, unspecified, third trimester: Secondary | ICD-10-CM

## 2023-03-12 NOTE — Progress Notes (Signed)
Pt presents for ROB visit. Pt c/o nose bleeds, rash on the arms, swelling and numbness in the hands, headaches, rectal pressure and white vaginal discharge.

## 2023-03-12 NOTE — Progress Notes (Signed)
PRENATAL VISIT NOTE  Subjective:  Tracy [name] is a 37 y.o. [phone] at [redacted]w[redacted]d being seen today for ongoing prenatal care.  She is currently monitored for the following issues for this high-risk pregnancy and has Gestational diabetes; History of gestational diabetes; Chronic hypertension affecting pregnancy; Obesity affecting pregnancy in second trimester; Supervision of high risk pregnancy, antepartum, second trimester; and AMA (advanced maternal age) multigravida 35+ on their problem list.  Patient reports  nose bleeds, rash on arms, swelling and numbness in her hands, headaches, rectal pressure, and white discharge that started since last visit . Reports nose bleeds 1-2x/week that resolve on their own with applied pressure. Not associated with Headaches, but has had 2-3 headaches since her last visit that go away with tylenol or vicks vapor rub. Denies any blurred vision or dizziness. Also notes increased rectal pressure with change in vaginal discharge and burning with urination. States discharge is white and thick, but not itchy/ Bowel movements every other day, w/out strain. Also- noticed a rash on her left elbow that is itchy without a topical cream she has been applying (patient unsure of ingredients).  Contractions: Irritability. Vag. Bleeding: None, Scant.  Movement: Present. Denies leaking of fluid.   The following portions of the patient's history were reviewed and updated as appropriate: allergies, current medications, past family history, past medical history, past social history, past surgical history and problem list.   Objective:   Vitals:   03/12/23 0811  BP: 130/81  Pulse: 96  Weight: 172 lb (78 kg)    Fetal Status: Fetal Heart Rate (bpm): 145 Fundal Height: 35 cm Movement: Present     General:  Alert, oriented and cooperative. Patient is in no acute distress.  Skin: Skin is warm and dry. Rash contained to left elbow. Reddened tiny papules present at different  healing stages.   Cardiovascular: Normal heart rate noted  Respiratory: Normal respiratory effort, no problems with respiration noted  Abdomen: Soft, gravid, appropriate for gestational age.  Pain/Pressure: Present     Pelvic: Cervical exam deferred        Extremities: Normal range of motion.  Edema: Trace, Hands and face w/out swelling today  Mental Status: Normal mood and affect. Normal behavior. Normal judgment and thought content.   Assessment and Plan:  Pregnancy: [id number] at [redacted]w[redacted]d 1. Supervision of high risk pregnancy in third trimester - fetal movement present  - Recommend continued use of topical skin cream and discussed options for topical steroid cream or antihistamine cream. Reassurance provided that skin irritation appears to be acute allergic reaction or inflammation  - Recommend support belt to decrease pelvic floor discomfort. Discussed benefit of changing positions often - Discussed precautions for worsening headaches, more frequent nose bleeds, onset of dizziness, blurred vision, n/v requiring follow-up  2. [redacted] weeks gestation of pregnancy - continue routine follow-up  3. Diet controlled gestational diabetes mellitus (GDM) in third trimester - growth scan is scheduled - GDM class scheduled - log provided to patient today to start tracking BS (Pt knows how to take blood sugars)  4. Chronic hypertension affecting pregnancy - BP stable in office and at home - ASA recommended  5. Obesity affecting pregnancy in third trimester, unspecified obesity type - follow-up growth scan is scheduled  6. Multigravida of advanced maternal age in third trimester - ASA recommended - Normal NIPS  7. Unwanted fertility - desires BTL, consent on file  8. Excessive fetal growth affecting management of pregnancy in third trimester, single or unspecified fetus -  follow-up growth scan is scheduled  - 97th percentile - FH > GA, discussed with patient   9. Vaginal irritation -  Cervicovaginal ancillary only - Culture, OB Urine   Preterm labor symptoms and general obstetric precautions including but not limited to vaginal bleeding, contractions, leaking of fluid and fetal movement were reviewed in detail with the patient. Please refer to After Visit Summary for other counseling recommendations.   Return in about 2 weeks (around 03/26/2023) for [facility], IN-PERSON.  Future Appointments  Date Time Provider Department Center  03/14/2023  2:45 PM [facility] GDM CLASS [facility]  03/27/2023  3:30 PM [facility] NURSE [facility]  03/27/2023  3:45 PM [facility] US5 [facility]    Corlis Hove, NP

## 2023-03-14 ENCOUNTER — Other Ambulatory Visit: Payer: Self-pay | Admitting: Student

## 2023-03-14 ENCOUNTER — Ambulatory Visit: Payer: Medicaid Other

## 2023-03-14 ENCOUNTER — Encounter (HOSPITAL_COMMUNITY): Payer: Self-pay | Admitting: Obstetrics and Gynecology

## 2023-03-14 ENCOUNTER — Inpatient Hospital Stay (HOSPITAL_COMMUNITY)
Admission: AD | Admit: 2023-03-14 | Discharge: 2023-03-15 | Disposition: A | Discharge status: Home / Self Care | Payer: Medicaid Other | Attending: Obstetrics and Gynecology | Admitting: Obstetrics and Gynecology

## 2023-03-14 DIAGNOSIS — B3731 Acute candidiasis of vulva and vagina: Secondary | ICD-10-CM

## 2023-03-14 DIAGNOSIS — O98513 Other viral diseases complicating pregnancy, third trimester: Secondary | ICD-10-CM | POA: Diagnosis not present

## 2023-03-14 DIAGNOSIS — R0981 Nasal congestion: Secondary | ICD-10-CM

## 2023-03-14 DIAGNOSIS — Z3A32 32 weeks gestation of pregnancy: Secondary | ICD-10-CM | POA: Diagnosis not present

## 2023-03-14 DIAGNOSIS — N898 Other specified noninflammatory disorders of vagina: Secondary | ICD-10-CM

## 2023-03-14 DIAGNOSIS — R051 Acute cough: Secondary | ICD-10-CM

## 2023-03-14 DIAGNOSIS — U071 COVID-19: Secondary | ICD-10-CM | POA: Insufficient documentation

## 2023-03-14 DIAGNOSIS — O4700 False labor before 37 completed weeks of gestation, unspecified trimester: Secondary | ICD-10-CM

## 2023-03-14 DIAGNOSIS — O4703 False labor before 37 completed weeks of gestation, third trimester: Secondary | ICD-10-CM | POA: Insufficient documentation

## 2023-03-14 DIAGNOSIS — O0993 Supervision of high risk pregnancy, unspecified, third trimester: Secondary | ICD-10-CM

## 2023-03-14 LAB — URINALYSIS, ROUTINE W REFLEX MICROSCOPIC
Bilirubin Urine: NEGATIVE
Glucose, UA: NEGATIVE mg/dL
Hgb urine dipstick: NEGATIVE
Ketones, ur: 5 mg/dL — AB
Nitrite: NEGATIVE
Protein, ur: NEGATIVE mg/dL
Specific Gravity, Urine: 1.01 (ref 1.005–1.030)
pH: 6 (ref 5.0–8.0)

## 2023-03-14 LAB — FETAL FIBRONECTIN: Fetal Fibronectin: NEGATIVE

## 2023-03-14 MED ORDER — PAXLOVID (300/100) 20 X 150 MG & 10 X 100MG PO TBPK: 3.0000 | ORAL_TABLET | Freq: Two times a day (BID) | ORAL | 0 refills | Status: AC

## 2023-03-14 MED ORDER — LACTATED RINGERS IV SOLN: Freq: Once | INTRAVENOUS | Status: AC

## 2023-03-14 MED ORDER — MICONAZOLE NITRATE 2 % VA CREA
1.0000 | TOPICAL_CREAM | Freq: Every day | VAGINAL | 2 refills | Status: DC
Start: 2023-03-14 — End: 2023-04-13

## 2023-03-14 MED ORDER — FLUTICASONE PROPIONATE 50 MCG/ACT NA SUSP
2.0000 | Freq: Once | NASAL | Status: AC
  Administered 2023-03-14: 2 via NASAL
  Filled 2023-03-14: qty 16

## 2023-03-14 MED ORDER — ACETAMINOPHEN 500 MG PO TABS
1000.0000 mg | ORAL_TABLET | Freq: Once | ORAL | Status: AC
  Administered 2023-03-14: 1000 mg via ORAL
  Filled 2023-03-14: qty 2

## 2023-03-14 MED ORDER — LACTATED RINGERS IV BOLUS
1000.0000 mL | Freq: Once | INTRAVENOUS | Status: AC
  Administered 2023-03-14: 1000 mL via INTRAVENOUS
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
## 2023-03-14 NOTE — MAU Provider Note (Signed)
Chief Complaint:  Constipation and Shortness of Breath   Event Date/Time   First Provider Initiated Contact with Patient 03/14/23 2050   HPI: Tracy [name] is a 37 y.o. [id number] at 29w4dwho presents to maternity admissions reporting contractions, congestion, body aches and cough.  Diagnosed with Covid yesterday . She reports good fetal movement, denies LOF, vaginal bleeding, vaginal itching/burning, urinary symptoms, h/a, dizziness, diarrhea, constipation or fever/chills.    Emesis  This is a new problem. The current episode started today. The problem occurs less than 2 times per day. Associated symptoms include a fever, myalgias and URI. Pertinent negatives include no diarrhea, dizziness or headaches. She has tried acetaminophen for the symptoms. The treatment provided mild relief.   RN Note: Tracy [name] is a 37 y.o. at [redacted]w[redacted]d here in MAU via EMS reporting:  contractions since last night that are now every 2 minutes. Denies vaginal bleeding or leaking of fluid. +FM  Tested positive for COVID yesterday. Took a test because she started having shortness of breath, which is now resolved. Now reports congestion, body aches, and cough.  Took 325 mg of tylenol around 3 pm Patient reports that she vomited twice today after eating food.   Per EMS: Blood sugar on truck was 102  Pain score: 7/10  Past Medical History: Past Medical History:  Diagnosis Date   Chronic cystitis 12/15/2021   Contraception management 06/16/2021   Depression    Diabetes mellitus without complication (HCC)    diet controlled   Encounter for Nexplanon removal 06/16/2021   Gestational diabetes    Pelvic pain 12/15/2021   Teeth problem    cracked molars, pt unsure if just right side or both sides    Past obstetric history: OB History  Gravida Para Term Preterm AB Living  7 3 3  0 3 3  SAB IAB Ectopic Multiple Live Births  3 0 0 0 3    # Outcome Date GA Lbr Len/2nd Weight Sex Type Anes PTL Lv   7 Current           6 Term 01/18/20 [redacted]w[redacted]d / 00:17 3799 g M Vag-Spont EPI  LIV     Birth Comments: WNL  5 Term 06/24/16 [redacted]w[redacted]d 03:47 / 01:04 3595 g F Vag-Spont EPI  LIV  4 Term 02/20/13 [redacted]w[redacted]d 09:47 / 01:08 3260 g M Vag-Spont Local, EPI  LIV  3 SAB           2 SAB           1 SAB             Past Surgical History: Past Surgical History:  Procedure Laterality Date   NO PAST SURGERIES      Family History: Family History  Problem Relation Age of Onset   Diabetes Mother    Diabetes Father     Social History: Social History   Tobacco Use   Smoking status: Former    Types: Cigarettes   Smokeless tobacco: Never  Vaping Use   Vaping status: Never Used  Substance Use Topics   Alcohol use: No   Drug use: No    Allergies: No Known Allergies  Meds:  Medications Prior to Admission  Medication Sig Dispense Refill Last Dose   aspirin EC 81 MG tablet Take 1 tablet (81 mg total) by mouth daily. Take after 12 weeks for prevention of preeclampsia later in pregnancy 300 tablet 2 03/14/2023   ferrous sulfate 325 (65 FE) MG EC tablet Take 1 tablet (  325 mg total) by mouth every other day. 30 tablet 0 03/14/2023   Prenatal Vit-Fe Fumarate-FA (PREPLUS) 27-1 MG TABS Take 1 tablet by mouth daily. 30 tablet 13 03/14/2023   Accu-Chek Softclix Lancets lancets 100 each by Other route 4 (four) times daily. 100 each 12    Blood Glucose Monitoring Suppl (ACCU-CHEK GUIDE) w/Device KIT 1 kit by Does not apply route daily. 1 kit 0    Blood Pressure Monitoring (BLOOD PRESSURE KIT) DEVI 1 kit by Does not apply route once a week. 1 each 0    cyclobenzaprine (FLEXERIL) 5 MG tablet Take 1 tablet (5 mg total) by mouth every 8 (eight) hours as needed for muscle spasms. (Patient not taking: Reported on 01/22/2023) 30 tablet 0    glucose blood (ACCU-CHEK GUIDE) test strip Use as instructed 100 each 12    Prenatal Vit-Fe Phos-FA-Omega (VITAFOL GUMMIES) 3.33-0.333-34.8 MG CHEW Chew 3 tablets by mouth daily in the  afternoon. 90 tablet 11     I have reviewed patient's Past Medical Hx, Surgical Hx, Family Hx, Social Hx, medications and allergies.   ROS:  Review of Systems  Constitutional:  Positive for fever.  Gastrointestinal:  Positive for vomiting. Negative for diarrhea.  Musculoskeletal:  Positive for myalgias.  Neurological:  Negative for dizziness and headaches.   Other systems negative  Physical Exam  Patient Vitals for the past 24 hrs:  BP Temp Temp src Pulse Resp SpO2 Weight  03/14/23 2105 -- 98.5 F (36.9 C) Oral -- -- -- --  03/14/23 2042 (!) 138/95 -- -- (!) 123 -- -- --  03/14/23 2018 (!) 141/79 100 F (37.8 C) Oral (!) 122 20 98 % 78 kg   Constitutional: Well-developed, well-nourished female in no acute distress.  Cardiovascular: normal rate and rhythm Respiratory: normal effort, clear to auscultation bilaterally GI: Abd soft, non-tender, gravid appropriate for gestational age.   No rebound or guarding. MS: Extremities nontender, no edema, normal ROM Neurologic: Alert and oriented x 4.  GU: Neg CVAT.  PELVIC EXAM:  Dilation: Closed Effacement (%): Thick Exam by:: Wynelle Bourgeois, CNM  FHT:  Baseline 150 , moderate variability, accelerations present, no decelerations Contractions: q 2-3 mins Irregular     Labs: Results for orders placed or performed during the hospital encounter of 03/14/23 (from the past 24 hour(s))  Urinalysis, Routine w reflex microscopic -Urine, Clean Catch     Status: Abnormal   Collection Time: 03/14/23  8:14 PM  Result Value Ref Range   Color, Urine YELLOW YELLOW   APPearance HAZY (A) CLEAR   Specific Gravity, Urine 1.010 1.005 - 1.030   pH 6.0 5.0 - 8.0   Glucose, UA NEGATIVE NEGATIVE mg/dL   Hgb urine dipstick NEGATIVE NEGATIVE   Bilirubin Urine NEGATIVE NEGATIVE   Ketones, ur 5 (A) NEGATIVE mg/dL   Protein, ur NEGATIVE NEGATIVE mg/dL   Nitrite NEGATIVE NEGATIVE   Leukocytes,Ua TRACE (A) NEGATIVE   RBC / HPF 0-5 0 - 5 RBC/hpf   WBC, UA  0-5 0 - 5 WBC/hpf   Bacteria, UA RARE (A) NONE SEEN   Squamous Epithelial / HPF 6-10 0 - 5 /HPF   Mucus PRESENT   Fetal fibronectin     Status: None   Collection Time: 03/14/23  8:46 PM  Result Value Ref Range   Fetal Fibronectin NEGATIVE NEGATIVE    O/Positive/-- (03/21 1434)  Imaging:    MAU Course/MDM: I have reviewed the triage vital signs and the nursing notes.   Pertinent labs &  imaging results that were available during my care of the patient were reviewed by me and considered in my medical decision making (see chart for details).      I have reviewed her medical records including past results, notes and treatments.   I have ordered labs and reviewed results. Urine is normal   Fetal fibronectin is negative NST reviewed  Treatments in MAU included Tylenol for fever and aches, Flonase for congestion, LR infusion for hydration x 2 liters.  This helped diminish contractions and patient felt better..  FHR reactive throughout, and baseline FHR came down with hydration.   Assessment: Single IUP at [redacted]w[redacted]d Preterm uterine contractions due to Covid infections, stopped with hydration Covid infection  Plan: Discharge home Rx Paxlovid for covid Supportive care Flonase prn congestion.  Preterm Labor precautions and fetal kick counts Follow up in Office for prenatal visits and recheck Encouraged to return if she develops worsening of symptoms, increase in pain, fever, or other concerning symptoms.   Pt stable at time of discharge.  Wynelle Bourgeois CNM, MSN Certified Nurse-Midwife 03/14/2023 9:20 PM

## 2023-03-26 ENCOUNTER — Encounter: Payer: Self-pay | Admitting: Obstetrics and Gynecology

## 2023-03-26 ENCOUNTER — Telehealth (INDEPENDENT_AMBULATORY_CARE_PROVIDER_SITE_OTHER): Payer: Medicaid Other | Admitting: Obstetrics and Gynecology

## 2023-03-26 VITALS — Wt 171.0 lb

## 2023-03-26 DIAGNOSIS — O0993 Supervision of high risk pregnancy, unspecified, third trimester: Secondary | ICD-10-CM

## 2023-03-26 DIAGNOSIS — O99213 Obesity complicating pregnancy, third trimester: Secondary | ICD-10-CM

## 2023-03-26 DIAGNOSIS — O0992 Supervision of high risk pregnancy, unspecified, second trimester: Secondary | ICD-10-CM

## 2023-03-26 DIAGNOSIS — O10919 Unspecified pre-existing hypertension complicating pregnancy, unspecified trimester: Secondary | ICD-10-CM

## 2023-03-26 DIAGNOSIS — Z3A34 34 weeks gestation of pregnancy: Secondary | ICD-10-CM

## 2023-03-26 DIAGNOSIS — O09523 Supervision of elderly multigravida, third trimester: Secondary | ICD-10-CM

## 2023-03-26 DIAGNOSIS — E669 Obesity, unspecified: Secondary | ICD-10-CM

## 2023-03-26 DIAGNOSIS — O99212 Obesity complicating pregnancy, second trimester: Secondary | ICD-10-CM

## 2023-03-26 DIAGNOSIS — O10913 Unspecified pre-existing hypertension complicating pregnancy, third trimester: Secondary | ICD-10-CM

## 2023-03-26 DIAGNOSIS — O24415 Gestational diabetes mellitus in pregnancy, controlled by oral hypoglycemic drugs: Secondary | ICD-10-CM

## 2023-03-26 MED ORDER — METFORMIN HCL 1000 MG PO TABS: 1000.0000 mg | ORAL_TABLET | Freq: Two times a day (BID) | ORAL | 3 refills | Status: DC

## 2023-03-26 NOTE — Progress Notes (Signed)
OBSTETRICS PRENATAL VIRTUAL VISIT ENCOUNTER NOTE  Provider location: [facility]   Patient location: Home  I connected with [name] on 03/26/23 at  2:10 PM EDT by MyChart Video Encounter and verified that I am speaking with the correct person using two identifiers. I discussed the limitations, risks, security and privacy concerns of performing an evaluation and management service virtually and the availability of in person appointments. I also discussed with the patient that there may be a patient responsible charge related to this service. The patient expressed understanding and agreed to proceed. Subjective:  Tracy [name] is a 37 y.o. [phone] at [redacted]w[redacted]d being seen today for ongoing prenatal care.  She is currently monitored for the following issues for this high-risk pregnancy and has Gestational diabetes; History of gestational diabetes; Chronic hypertension affecting pregnancy; Obesity affecting pregnancy in second trimester; Supervision of high risk pregnancy, antepartum, second trimester; and AMA (advanced maternal age) multigravida 35+ on their problem list.  Patient reports no complaints.  Contractions: Regular. Vag. Bleeding: None.  Movement: Present. Denies any leaking of fluid.   The following portions of the patient's history were reviewed and updated as appropriate: allergies, current medications, past family history, past medical history, past social history, past surgical history and problem list.   Objective:   Vitals:   03/26/23 1325  Weight: 171 lb (77.6 kg)    Fetal Status:     Movement: Present     General:  Alert, oriented and cooperative. Patient is in no acute distress.  Respiratory: Normal respiratory effort, no problems with respiration noted  Mental Status: Normal mood and affect. Normal behavior. Normal judgment and thought content.  Rest of physical exam deferred due to type of encounter  Imaging: [place]  MFM OB FOLLOW UP  Result Date: 02/26/2023 ----------------------------------------------------------------------  OBSTETRICS REPORT                       (Signed Final 02/26/2023 04:17 pm) ---------------------------------------------------------------------- Patient Info  ID #:       [id number]                          D.O.B.:  [birth year] (36 yrs)(F)  Name:       Tracy [name]                 Visit Date: 02/26/2023 02:34 pm              [name] ---------------------------------------------------------------------- Performed By  Attending:        Ma Rings MD         Ref. Address:     [address]                                                             [phone]  [place]                                                             [address]  Performed By:     Tommie Raymond BS,       [facility]  Referred By:      [facility] ---------------------------------------------------------------------- Orders  #  Description                           Code        Ordered By  1  [place] MFM OB FOLLOW UP                   60454.09    Noralee Space ----------------------------------------------------------------------  #  Order #                     Accession #                Episode #  1  [id number]                   [id number]                 [id number] ---------------------------------------------------------------------- Indications  Polyhydramnios, third trimester, antepartum    O40.3XX0  condition or complication, unspecified fetus  Gestational diabetes in pregnancy,             O24.419  unspecified control  Advanced maternal age primigravida 26+,        O3.513   third trimester  Hypertension - Chronic/Pre-existing            O10.019  Obesity complicating pregnancy, third          O99.213  trimester (BMI 31)  Poor obstetric history: Previous gestational   O09.299  diabetes (Elevated A1c 10/19/22)  LR NIPS - Female, Negative Horizon,  Negative AFP  Encounter for other antenatal screening        Z36.2  follow-up  [redacted] weeks gestation of pregnancy                Z3A.30 ---------------------------------------------------------------------- Fetal Evaluation  Num Of Fetuses:  1  Fetal Heart Rate(bpm):  117  Cardiac Activity:       Observed  Presentation:           Variable  Placenta:               Anterior  P. Cord Insertion:      Previously visualized  Amniotic Fluid  AFI FV:      Subjectively upper-normal  AFI Sum(cm)     %Tile       Largest Pocket(cm)  20.5            80          7.63  RUQ(cm)       RLQ(cm)       LUQ(cm)        LLQ(cm)  3.63          6.7           2.55           7.63 ---------------------------------------------------------------------- Biometry  BPD:      78.3  mm     G. Age:  31w 3d         73  %    CI:        71.69   %    70 - 86                                                          FL/HC:      18.6   %    19.2 - 21.4  HC:      294.4  mm     G. Age:  32w 4d         77  %    HC/AC:      0.96        0.99 - 1.21  AC:      306.9  mm     G. Age:  34w 4d       > 99  %    FL/BPD:     69.9   %    71 - 87  FL:       54.7  mm     G. Age:  28w 6d          7  %    FL/AC:      17.8   %    20 - 24  LV:        5.3  mm  Est. FW:    1994  gm      4 lb 6 oz     97  % ---------------------------------------------------------------------- OB History  Gravidity:    7         Term:   3        Prem:   0        SAB:   3  TOP:          0       Ectopic:  0        Living: 3 ---------------------------------------------------------------------- Gestational Age  LMP:           30w 2d        Date:  07/29/22                 EDD:   05/05/23  U/S Today:     31w 6d                                         EDD:   04/24/23  Best:          30w 2d     Det. By:  LMP  (07/29/22)          EDD:   05/05/23 ---------------------------------------------------------------------- Anatomy  Cranium:               Appears normal         Aortic Arch:            Previously seen  Cavum:                 Appears normal         Ductal Arch:            Previously seen  Ventricles:            Appears normal         Diaphragm:              Appears normal  Choroid Plexus:        Previously seen        Stomach:                Appears normal, left                                                                        sided  Cerebellum:            Previously seen        Abdomen:                Previously seen  Posterior Fossa:       Previously seen        Abdominal Wall:         Previously seen  Nuchal Fold:           Previously seen        Cord Vessels:           Previously seen  Face:                  Orbits and profile     Kidneys:                Appear normal                         previously seen  Lips:                  Previously seen        Bladder:                Appears normal  Thoracic:              Appears normal         Spine:                  Previously seen  Heart:  Previously seen        Upper Extremities:      Previously seen  RVOT:                  Previously seen        Lower Extremities:      Previously seen  LVOT:                  Previously seen  Other:  Female gender, Heels/feet, open hands/5th digits, nasal bone,          Mandible, maxilla, lenses, VC, 3VV, and 3VTV previously visualized.          Technically difficult due to advanced GA and fetal position. ---------------------------------------------------------------------- Cervix Uterus Adnexa  Cervix  Length:            4.9  cm.  Normal appearance by transabdominal scan  Uterus  No abnormality visualized.  Right Ovary  Within normal limits.  Left Ovary  Within normal limits.  Adnexa  No abnormality visualized  ---------------------------------------------------------------------- Comments  This patient was seen for a follow up growth scan due to  advanced maternal age and polyhydramnios noted on her  last exam.  The patient was recently diagnosed with  gestational diabetes.  She was informed that the fetal growth measures large for  her gestational age (97th percentile).  There was normal  amniotic fluid noted today with a total AFI of 20.5 cm.  A follow-up exam was scheduled in 4 weeks to assess the  fetal growth.  Weekly fetal testing is recommended starting at 32 weeks  should she require insulin or metformin for treatment of  gestational diabetes. ----------------------------------------------------------------------                   Ma Rings, MD Electronically Signed Final Report   02/26/2023 04:17 pm ----------------------------------------------------------------------    Assessment and Plan:  Pregnancy: [id number] at [redacted]w[redacted]d 1. Supervision of high risk pregnancy, antepartum, second trimester Patient is doing well without complaints  2. Gestational diabetes mellitus (GDM) in third trimester controlled on oral hypoglycemic drug CBGs reviewed and all values elevated Reviewed importance of euglycemia with the patient and to comply to diet Rx metformin 1000 mg BID provided  3. Chronic hypertension affecting pregnancy Patient unable to check BP today Follow up growth ultrasound on 8/27  4. Obesity affecting pregnancy in second trimester, unspecified obesity type Continue ASA  5. Multigravida of advanced maternal age in third trimester   Preterm labor symptoms and general obstetric precautions including but not limited to vaginal bleeding, contractions, leaking of fluid and fetal movement were reviewed in detail with the patient. I discussed the assessment and treatment plan with the patient. The patient was provided an opportunity to ask questions and all were answered. The patient agreed with the  plan and demonstrated an understanding of the instructions. The patient was advised to call back or seek an in-person office evaluation/go to MAU at [facility] for any urgent or concerning symptoms. Please refer to After Visit Summary for other counseling recommendations.   I provided 15 minutes of face-to-face time during this encounter.  Return in about 2 weeks (around 04/09/2023) for in person, ROB, High risk.  Future Appointments  Date Time Provider Department Center  03/26/2023  2:10 PM Zyon Rosser, Gigi Gin, MD [facility] None  03/27/2023  3:30 PM [facility] NURSE [facility]  03/27/2023  3:45 PM [facility] US5 [facility]    Catalina Antigua, MD [facility] [employer]

## 2023-03-26 NOTE — Progress Notes (Signed)
MyChart OB, Pt is unable to check her BP now, because she will have to search for the BP cuff.  Her fasting BS 100-124, 2 hrs after meal 130.

## 2023-03-27 ENCOUNTER — Encounter: Payer: Medicaid Other | Admitting: Obstetrics and Gynecology

## 2023-03-27 ENCOUNTER — Encounter: Payer: Self-pay | Admitting: *Deleted

## 2023-03-27 ENCOUNTER — Other Ambulatory Visit: Payer: Self-pay | Admitting: Obstetrics

## 2023-03-27 ENCOUNTER — Ambulatory Visit: Payer: Medicaid Other | Attending: Obstetrics

## 2023-03-27 ENCOUNTER — Ambulatory Visit: Payer: Medicaid Other | Admitting: *Deleted

## 2023-03-27 DIAGNOSIS — O09523 Supervision of elderly multigravida, third trimester: Secondary | ICD-10-CM

## 2023-03-27 DIAGNOSIS — O403XX0 Polyhydramnios, third trimester, not applicable or unspecified: Secondary | ICD-10-CM

## 2023-03-27 DIAGNOSIS — O10913 Unspecified pre-existing hypertension complicating pregnancy, third trimester: Secondary | ICD-10-CM | POA: Insufficient documentation

## 2023-03-27 DIAGNOSIS — O24415 Gestational diabetes mellitus in pregnancy, controlled by oral hypoglycemic drugs: Secondary | ICD-10-CM

## 2023-03-27 DIAGNOSIS — O3660X0 Maternal care for excessive fetal growth, unspecified trimester, not applicable or unspecified: Secondary | ICD-10-CM

## 2023-03-27 DIAGNOSIS — O99213 Obesity complicating pregnancy, third trimester: Secondary | ICD-10-CM

## 2023-03-27 DIAGNOSIS — O09293 Supervision of pregnancy with other poor reproductive or obstetric history, third trimester: Secondary | ICD-10-CM

## 2023-03-27 DIAGNOSIS — O24419 Gestational diabetes mellitus in pregnancy, unspecified control: Secondary | ICD-10-CM | POA: Diagnosis present

## 2023-03-27 DIAGNOSIS — O3663X0 Maternal care for excessive fetal growth, third trimester, not applicable or unspecified: Secondary | ICD-10-CM

## 2023-03-27 DIAGNOSIS — O10013 Pre-existing essential hypertension complicating pregnancy, third trimester: Secondary | ICD-10-CM

## 2023-03-27 DIAGNOSIS — Z3A34 34 weeks gestation of pregnancy: Secondary | ICD-10-CM

## 2023-03-27 DIAGNOSIS — E669 Obesity, unspecified: Secondary | ICD-10-CM

## 2023-03-28 ENCOUNTER — Other Ambulatory Visit: Payer: Self-pay | Admitting: *Deleted

## 2023-03-28 DIAGNOSIS — O3663X0 Maternal care for excessive fetal growth, third trimester, not applicable or unspecified: Secondary | ICD-10-CM

## 2023-03-28 DIAGNOSIS — O24419 Gestational diabetes mellitus in pregnancy, unspecified control: Secondary | ICD-10-CM

## 2023-03-28 DIAGNOSIS — O10913 Unspecified pre-existing hypertension complicating pregnancy, third trimester: Secondary | ICD-10-CM

## 2023-03-28 DIAGNOSIS — O09523 Supervision of elderly multigravida, third trimester: Secondary | ICD-10-CM

## 2023-03-31 ENCOUNTER — Encounter (HOSPITAL_COMMUNITY): Payer: Self-pay | Admitting: Obstetrics and Gynecology

## 2023-03-31 ENCOUNTER — Other Ambulatory Visit: Payer: Self-pay

## 2023-03-31 ENCOUNTER — Inpatient Hospital Stay (HOSPITAL_COMMUNITY)
Admission: AD | Admit: 2023-03-31 | Discharge: 2023-04-01 | Disposition: A | Discharge status: Home / Self Care | Payer: Medicaid Other | Attending: Obstetrics and Gynecology | Admitting: Obstetrics and Gynecology

## 2023-03-31 DIAGNOSIS — O212 Late vomiting of pregnancy: Secondary | ICD-10-CM | POA: Insufficient documentation

## 2023-03-31 DIAGNOSIS — O99891 Other specified diseases and conditions complicating pregnancy: Secondary | ICD-10-CM

## 2023-03-31 DIAGNOSIS — O09523 Supervision of elderly multigravida, third trimester: Secondary | ICD-10-CM | POA: Diagnosis not present

## 2023-03-31 DIAGNOSIS — O4700 False labor before 37 completed weeks of gestation, unspecified trimester: Secondary | ICD-10-CM

## 2023-03-31 DIAGNOSIS — Z0371 Encounter for suspected problem with amniotic cavity and membrane ruled out: Secondary | ICD-10-CM | POA: Diagnosis not present

## 2023-03-31 DIAGNOSIS — O219 Vomiting of pregnancy, unspecified: Secondary | ICD-10-CM

## 2023-03-31 DIAGNOSIS — Z3A35 35 weeks gestation of pregnancy: Secondary | ICD-10-CM | POA: Diagnosis not present

## 2023-03-31 DIAGNOSIS — M549 Dorsalgia, unspecified: Secondary | ICD-10-CM

## 2023-03-31 DIAGNOSIS — O4703 False labor before 37 completed weeks of gestation, third trimester: Secondary | ICD-10-CM | POA: Diagnosis not present

## 2023-03-31 LAB — CBC
HCT: 29.9 % — ABNORMAL LOW (ref 36.0–46.0)
Hemoglobin: 9.2 g/dL — ABNORMAL LOW (ref 12.0–15.0)
MCH: 23.7 pg — ABNORMAL LOW (ref 26.0–34.0)
MCHC: 30.8 g/dL (ref 30.0–36.0)
MCV: 76.9 fL — ABNORMAL LOW (ref 80.0–100.0)
Platelets: 353 10*3/uL (ref 150–400)
RBC: 3.89 MIL/uL (ref 3.87–5.11)
RDW: 14.3 % (ref 11.5–15.5)
WBC: 8.9 10*3/uL (ref 4.0–10.5)
nRBC: 0 % (ref 0.0–0.2)

## 2023-03-31 LAB — BASIC METABOLIC PANEL
Anion gap: 12 (ref 5–15)
BUN: 6 mg/dL (ref 6–20)
CO2: 17 mmol/L — ABNORMAL LOW (ref 22–32)
Calcium: 9.3 mg/dL (ref 8.9–10.3)
Chloride: 104 mmol/L (ref 98–111)
Creatinine, Ser: 0.56 mg/dL (ref 0.44–1.00)
GFR, Estimated: >60 mL/min (ref >60)
Glucose, Bld: 102 mg/dL — ABNORMAL HIGH (ref 70–99)
Potassium: 3.8 mmol/L (ref 3.5–5.1)
Sodium: 133 mmol/L — ABNORMAL LOW (ref 135–145)

## 2023-03-31 LAB — URINALYSIS, ROUTINE W REFLEX MICROSCOPIC
Bilirubin Urine: NEGATIVE
Glucose, UA: NEGATIVE mg/dL
Hgb urine dipstick: NEGATIVE
Ketones, ur: NEGATIVE mg/dL
Nitrite: NEGATIVE
Protein, ur: NEGATIVE mg/dL
Specific Gravity, Urine: 1.012 (ref 1.005–1.030)
pH: 7 (ref 5.0–8.0)

## 2023-03-31 MED ORDER — ONDANSETRON HCL 4 MG/2ML IJ SOLN
4.0000 mg | Freq: Once | INTRAMUSCULAR | Status: AC
  Administered 2023-03-31: 4 mg via INTRAVENOUS
  Filled 2023-03-31: qty 2

## 2023-03-31 MED ORDER — LACTATED RINGERS IV SOLN: Freq: Once | INTRAVENOUS | Status: AC

## 2023-03-31 MED ORDER — CYCLOBENZAPRINE HCL 5 MG PO TABS
5.0000 mg | ORAL_TABLET | Freq: Once | ORAL | Status: AC
  Administered 2023-03-31: 5 mg via ORAL
  Filled 2023-03-31: qty 1

## 2023-03-31 NOTE — MAU Provider Note (Signed)
Chief Complaint:  Shortness of Breath, Contractions, Emesis, Pelvic Pain, Back Pain, and Rupture of Membranes   Event Date/Time   First Provider Initiated Contact with Patient 03/31/23 2052     HPI: Tracy [name] is a 37 y.o. [id number] at 7w0dwho presents to maternity admissions reporting contractions which hurt in abdomen and back.  Also has wetness in underwear. Vomited 3 times at home  Told RN she had shortness of breath but does not report this to me or appear dyspneic.Marland Kitchen She reports good fetal movement, denies vaginal bleeding, vaginal itching/burning, urinary symptoms, h/a, dizziness, diarrhea, constipation or fever/chills.    Emesis  This is a new problem. The current episode started today. The problem occurs 2 to 4 times per day. The problem has been resolved. There has been no fever. Associated symptoms include abdominal pain. Pertinent negatives include no diarrhea or fever. She has tried nothing for the symptoms.  Pelvic Pain The patient's primary symptoms include pelvic pain. The patient's pertinent negatives include no vaginal bleeding. The current episode started today. Associated symptoms include abdominal pain, back pain and vomiting. Pertinent negatives include no diarrhea or fever. Nothing aggravates the symptoms. She has tried nothing for the symptoms.  Back Pain The current episode started today. The quality of the pain is described as cramping. Associated symptoms include abdominal pain and pelvic pain. Pertinent negatives include no fever. She has tried nothing for the symptoms.   RN Note: Tracy [name] is a 37 y.o. at [redacted]w[redacted]d here in MAU reporting: pelvic pain, back pain, contractions every 2 minutes that started last night, possibly ruptured membranes (underwear is wet), denies vaginal bleeding. Patient feels fetal movement. Patient reports that she has vomited 2 or 3 times in the last 24 hours. She is also c/o shortness of breath. LMP: 07/29/2022 Onset of  complaint: 03/29/2022  Past Medical History: Past Medical History:  Diagnosis Date   Chronic cystitis 12/15/2021   Contraception management 06/16/2021   Depression    Diabetes mellitus without complication (HCC)    diet controlled   Encounter for Nexplanon removal 06/16/2021   Gestational diabetes    Pelvic pain 12/15/2021   Teeth problem    cracked molars, pt unsure if just right side or both sides    Past obstetric history: OB History  Gravida Para Term Preterm AB Living  7 3 3  0 3 3  SAB IAB Ectopic Multiple Live Births  3 0 0 0 3    # Outcome Date GA Lbr Len/2nd Weight Sex Type Anes PTL Lv  7 Current           6 Term 01/18/20 [redacted]w[redacted]d / 00:17 3799 g M Vag-Spont EPI  LIV     Birth Comments: WNL  5 Term 06/24/16 [redacted]w[redacted]d 03:47 / 01:04 3595 g F Vag-Spont EPI  LIV  4 Term 02/20/13 [redacted]w[redacted]d 09:47 / 01:08 3260 g M Vag-Spont Local, EPI  LIV  3 SAB           2 SAB           1 SAB             Past Surgical History: Past Surgical History:  Procedure Laterality Date   NO PAST SURGERIES      Family History: Family History  Problem Relation Age of Onset   Diabetes Mother    Diabetes Father     Social History: Social History   Tobacco Use   Smoking status: Former    Types: Cigarettes  Smokeless tobacco: Never  Vaping Use   Vaping status: Never Used  Substance Use Topics   Alcohol use: No   Drug use: No    Allergies: No Known Allergies  Meds:  Medications Prior to Admission  Medication Sig Dispense Refill Last Dose   aspirin EC 81 MG tablet Take 1 tablet (81 mg total) by mouth daily. Take after 12 weeks for prevention of preeclampsia later in pregnancy 300 tablet 2 03/30/2023   ferrous sulfate 325 (65 FE) MG EC tablet Take 1 tablet (325 mg total) by mouth every other day. 30 tablet 0 03/31/2023   metFORMIN (GLUCOPHAGE) 1000 MG tablet Take 1 tablet (1,000 mg total) by mouth 2 (two) times daily with a meal. 60 tablet 3 03/31/2023   Prenatal Vit-Fe Phos-FA-Omega (VITAFOL  GUMMIES) 3.33-0.333-34.8 MG CHEW Chew 3 tablets by mouth daily in the afternoon. 90 tablet 11 03/31/2023   Accu-Chek Softclix Lancets lancets 100 each by Other route 4 (four) times daily. 100 each 12    Blood Glucose Monitoring Suppl (ACCU-CHEK GUIDE) w/Device KIT 1 kit by Does not apply route daily. 1 kit 0    Blood Pressure Monitoring (BLOOD PRESSURE KIT) DEVI 1 kit by Does not apply route once a week. 1 each 0    cyclobenzaprine (FLEXERIL) 5 MG tablet Take 1 tablet (5 mg total) by mouth every 8 (eight) hours as needed for muscle spasms. (Patient not taking: Reported on 01/22/2023) 30 tablet 0    glucose blood (ACCU-CHEK GUIDE) test strip Use as instructed 100 each 12    miconazole (MONISTAT 7) 2 % vaginal cream Place 1 Applicatorful vaginally at bedtime. Apply for seven nights 30 g 2    Prenatal Vit-Fe Fumarate-FA (PREPLUS) 27-1 MG TABS Take 1 tablet by mouth daily. 30 tablet 13     I have reviewed patient's Past Medical Hx, Surgical Hx, Family Hx, Social Hx, medications and allergies.   ROS:  Review of Systems  Constitutional:  Negative for fever.  Gastrointestinal:  Positive for abdominal pain and vomiting. Negative for diarrhea.  Genitourinary:  Positive for pelvic pain.  Musculoskeletal:  Positive for back pain.   Other systems negative  Physical Exam  Patient Vitals for the past 24 hrs:  BP Temp Temp src Pulse Resp SpO2 Height Weight  03/31/23 1936 131/82 -- -- (!) 103 18 96 % -- --  03/31/23 1914 133/84 97.9 F (36.6 C) Oral (!) 102 18 -- -- --  03/31/23 1909 -- -- -- -- -- 96 % -- --  03/31/23 1859 -- -- -- -- -- -- 4\' 11"  (1.499 m) 78.5 kg   Constitutional: Well-developed, well-nourished female in no acute distress.  Cardiovascular: normal rate and rhythm Respiratory: normal effort, clear to auscultation bilaterally GI: Abd soft, non-tender, gravid appropriate for gestational age.   No rebound or guarding. MS: Extremities nontender, no edema, normal ROM Neurologic: Alert  and oriented x 4.  GU: Neg CVAT.  PELVIC EXAM:  Dilation: Fingertip Effacement (%): 50 Station: Ballotable Exam by:: Wynelle Bourgeois CNM  FHT:  Baseline 140 , moderate variability, accelerations present, no decelerations Contractions: q 4-6 mins Irregular    Labs: Results for orders placed or performed during the hospital encounter of 03/31/23 (from the past 24 hour(s))  Urinalysis, Routine w reflex microscopic -Urine, Clean Catch     Status: Abnormal   Collection Time: 03/31/23  7:32 PM  Result Value Ref Range   Color, Urine YELLOW YELLOW   APPearance HAZY (A) CLEAR   Specific  Gravity, Urine 1.012 1.005 - 1.030   pH 7.0 5.0 - 8.0   Glucose, UA NEGATIVE NEGATIVE mg/dL   Hgb urine dipstick NEGATIVE NEGATIVE   Bilirubin Urine NEGATIVE NEGATIVE   Ketones, ur NEGATIVE NEGATIVE mg/dL   Protein, ur NEGATIVE NEGATIVE mg/dL   Nitrite NEGATIVE NEGATIVE   Leukocytes,Ua TRACE (A) NEGATIVE   RBC / HPF 0-5 0 - 5 RBC/hpf   WBC, UA 6-10 0 - 5 WBC/hpf   Bacteria, UA RARE (A) NONE SEEN   Squamous Epithelial / HPF 0-5 0 - 5 /HPF   Mucus PRESENT   CBC     Status: Abnormal   Collection Time: 03/31/23  9:07 PM  Result Value Ref Range   WBC 8.9 4.0 - 10.5 K/uL   RBC 3.89 3.87 - 5.11 MIL/uL   Hemoglobin 9.2 (L) 12.0 - 15.0 g/dL   HCT 33.2 (L) 95.1 - 88.4 %   MCV 76.9 (L) 80.0 - 100.0 fL   MCH 23.7 (L) 26.0 - 34.0 pg   MCHC 30.8 30.0 - 36.0 g/dL   RDW 16.6 06.3 - 01.6 %   Platelets 353 150 - 400 K/uL   nRBC 0.0 0.0 - 0.2 %  Basic metabolic panel     Status: Abnormal   Collection Time: 03/31/23  9:07 PM  Result Value Ref Range   Sodium 133 (L) 135 - 145 mmol/L   Potassium 3.8 3.5 - 5.1 mmol/L   Chloride 104 98 - 111 mmol/L   CO2 17 (L) 22 - 32 mmol/L   Glucose, Bld 102 (H) 70 - 99 mg/dL   BUN 6 6 - 20 mg/dL   Creatinine, Ser 0.10 0.44 - 1.00 mg/dL   Calcium 9.3 8.9 - 93.2 mg/dL   GFR, Estimated >35 >57 mL/min   Anion gap 12 5 - 15    O/Positive/-- (03/21 1434)  Imaging:    MAU  Course/MDM: I have reviewed the triage vital signs and the nursing notes.   Pertinent labs & imaging results that were available during my care of the patient were reviewed by me and considered in my medical decision making (see chart for details).      I have reviewed her medical records including past results, notes and treatments.   I have ordered labs and reviewed results.  NST reviewed   Treatments in MAU included IV fluids and zofran for nausea.   Flexeril given for back pain with good relief.  . Contractions diminished with fluids.    Assessment: Single iUP at [redacted]w[redacted]d Nausea and vomiting Threatened preterm labor Back pain   Plan: Discharge home Preterm Labor precautions and fetal kick counts Follow up in Office for prenatal visits and recheck Encouraged to return if she develops worsening of symptoms, increase in pain, fever, or other concerning symptoms.   Pt stable at time of discharge.  Wynelle Bourgeois CNM, MSN Certified Nurse-Midwife 03/31/2023 8:53 PM

## 2023-03-31 NOTE — MAU Note (Signed)
Tracy [name] Tracy [name] is a 37 y.o. at [redacted]w[redacted]d here in MAU reporting: pelvic pain, back pain, contractions every 2 minutes that started last night, possibly ruptured membranes (underwear is wet), denies vaginal bleeding. Patient feels fetal movement. Patient reports that she has vomited 2 or 3 times in the last 24 hours. She is also c/o shortness of breath. LMP: 07/29/2022 Onset of complaint: 03/29/2022  Pain score: 8/10-back pain, 9/10-pelvic pain Vitals:   03/31/23 1909 03/31/23 1914  BP:  133/84  Pulse:  (!) 102  Resp:  18  Temp:  97.9 F (36.6 C)  SpO2: 96%      FHT: 130 Lab orders placed from triage: u/a

## 2023-04-01 DIAGNOSIS — O4703 False labor before 37 completed weeks of gestation, third trimester: Secondary | ICD-10-CM

## 2023-04-01 DIAGNOSIS — O219 Vomiting of pregnancy, unspecified: Secondary | ICD-10-CM

## 2023-04-01 DIAGNOSIS — Z3A35 35 weeks gestation of pregnancy: Secondary | ICD-10-CM

## 2023-04-01 DIAGNOSIS — O99891 Other specified diseases and conditions complicating pregnancy: Secondary | ICD-10-CM

## 2023-04-01 DIAGNOSIS — M549 Dorsalgia, unspecified: Secondary | ICD-10-CM

## 2023-04-03 ENCOUNTER — Encounter: Payer: Self-pay | Admitting: *Deleted

## 2023-04-03 ENCOUNTER — Ambulatory Visit: Payer: Medicaid Other | Attending: Obstetrics and Gynecology | Admitting: *Deleted

## 2023-04-03 ENCOUNTER — Ambulatory Visit: Payer: Medicaid Other | Admitting: *Deleted

## 2023-04-03 DIAGNOSIS — Z3A35 35 weeks gestation of pregnancy: Secondary | ICD-10-CM | POA: Insufficient documentation

## 2023-04-03 DIAGNOSIS — O10913 Unspecified pre-existing hypertension complicating pregnancy, third trimester: Secondary | ICD-10-CM | POA: Insufficient documentation

## 2023-04-03 NOTE — Procedures (Signed)
Tracy [name] [birth year] [redacted]w[redacted]d  Fetus A Non-Stress Test Interpretation for 04/03/23-NST only  Indication: Chronic Hypertenstion and Advanced Maternal Age >40 years  Fetal Heart Rate A Mode: External Baseline Rate (A): 135 bpm Variability: Moderate Accelerations: 15 x 15 Decelerations: None Multiple birth?: No  Uterine Activity Mode: Toco Contraction Frequency (min): 1 u/c during NST Contraction Duration (sec): 100 Contraction Quality: Mild Resting Tone Palpated: Relaxed  Interpretation (Fetal Testing) Nonstress Test Interpretation: Reactive Comments: Tracing reviewed by dR. Judeth Cornfield

## 2023-04-10 ENCOUNTER — Other Ambulatory Visit (HOSPITAL_COMMUNITY)
Admission: RE | Admit: 2023-04-10 | Discharge: 2023-04-10 | Disposition: A | Discharge status: Home / Self Care | Payer: Medicaid Other | Source: Ambulatory Visit | Attending: Advanced Practice Midwife | Admitting: Advanced Practice Midwife

## 2023-04-10 ENCOUNTER — Inpatient Hospital Stay (HOSPITAL_COMMUNITY)
Admission: AD | Admit: 2023-04-10 | Discharge: 2023-04-10 | Disposition: A | Discharge status: Home / Self Care | Payer: Medicaid Other | Source: Home / Self Care | Attending: Obstetrics and Gynecology | Admitting: Obstetrics and Gynecology

## 2023-04-10 ENCOUNTER — Ambulatory Visit (INDEPENDENT_AMBULATORY_CARE_PROVIDER_SITE_OTHER): Payer: Medicaid Other | Admitting: Advanced Practice Midwife

## 2023-04-10 ENCOUNTER — Other Ambulatory Visit: Payer: Self-pay

## 2023-04-10 ENCOUNTER — Encounter (HOSPITAL_COMMUNITY): Payer: Self-pay | Admitting: Obstetrics and Gynecology

## 2023-04-10 VITALS — BP 140/95 | HR 102 | Wt 177.0 lb

## 2023-04-10 DIAGNOSIS — O26893 Other specified pregnancy related conditions, third trimester: Secondary | ICD-10-CM | POA: Insufficient documentation

## 2023-04-10 DIAGNOSIS — Z3A36 36 weeks gestation of pregnancy: Secondary | ICD-10-CM | POA: Insufficient documentation

## 2023-04-10 DIAGNOSIS — R0602 Shortness of breath: Secondary | ICD-10-CM

## 2023-04-10 DIAGNOSIS — O0992 Supervision of high risk pregnancy, unspecified, second trimester: Secondary | ICD-10-CM | POA: Diagnosis present

## 2023-04-10 DIAGNOSIS — O10919 Unspecified pre-existing hypertension complicating pregnancy, unspecified trimester: Secondary | ICD-10-CM | POA: Diagnosis not present

## 2023-04-10 DIAGNOSIS — O10913 Unspecified pre-existing hypertension complicating pregnancy, third trimester: Secondary | ICD-10-CM | POA: Insufficient documentation

## 2023-04-10 DIAGNOSIS — O99891 Other specified diseases and conditions complicating pregnancy: Secondary | ICD-10-CM

## 2023-04-10 DIAGNOSIS — Z3689 Encounter for other specified antenatal screening: Secondary | ICD-10-CM | POA: Insufficient documentation

## 2023-04-10 DIAGNOSIS — O09523 Supervision of elderly multigravida, third trimester: Secondary | ICD-10-CM

## 2023-04-10 DIAGNOSIS — R519 Headache, unspecified: Secondary | ICD-10-CM | POA: Insufficient documentation

## 2023-04-10 DIAGNOSIS — M549 Dorsalgia, unspecified: Secondary | ICD-10-CM | POA: Insufficient documentation

## 2023-04-10 DIAGNOSIS — O24414 Gestational diabetes mellitus in pregnancy, insulin controlled: Secondary | ICD-10-CM

## 2023-04-10 DIAGNOSIS — O133 Gestational [pregnancy-induced] hypertension without significant proteinuria, third trimester: Secondary | ICD-10-CM | POA: Diagnosis not present

## 2023-04-10 DIAGNOSIS — O24415 Gestational diabetes mellitus in pregnancy, controlled by oral hypoglycemic drugs: Secondary | ICD-10-CM | POA: Diagnosis not present

## 2023-04-10 DIAGNOSIS — Z0371 Encounter for suspected problem with amniotic cavity and membrane ruled out: Secondary | ICD-10-CM

## 2023-04-10 DIAGNOSIS — O99013 Anemia complicating pregnancy, third trimester: Secondary | ICD-10-CM

## 2023-04-10 DIAGNOSIS — D649 Anemia, unspecified: Secondary | ICD-10-CM

## 2023-04-10 LAB — CBC WITH DIFFERENTIAL/PLATELET
Abs Immature Granulocytes: 0.06 10*3/uL (ref 0.00–0.07)
Basophils Absolute: 0 10*3/uL (ref 0.0–0.1)
Basophils Relative: 0 %
Eosinophils Absolute: 0.1 10*3/uL (ref 0.0–0.5)
Eosinophils Relative: 1 %
HCT: 29.6 % — ABNORMAL LOW (ref 36.0–46.0)
Hemoglobin: 9.3 g/dL — ABNORMAL LOW (ref 12.0–15.0)
Immature Granulocytes: 1 %
Lymphocytes Relative: 23 %
Lymphs Abs: 2 10*3/uL (ref 0.7–4.0)
MCH: 24.5 pg — ABNORMAL LOW (ref 26.0–34.0)
MCHC: 31.4 g/dL (ref 30.0–36.0)
MCV: 77.9 fL — ABNORMAL LOW (ref 80.0–100.0)
Monocytes Absolute: 0.7 10*3/uL (ref 0.1–1.0)
Monocytes Relative: 8 %
Neutro Abs: 5.9 10*3/uL (ref 1.7–7.7)
Neutrophils Relative %: 67 %
Platelets: 308 10*3/uL (ref 150–400)
RBC: 3.8 MIL/uL — ABNORMAL LOW (ref 3.87–5.11)
RDW: 14.3 % (ref 11.5–15.5)
WBC: 8.7 10*3/uL (ref 4.0–10.5)
nRBC: 0.2 % (ref 0.0–0.2)

## 2023-04-10 LAB — COMPREHENSIVE METABOLIC PANEL
ALT: 16 U/L (ref 0–44)
AST: 22 U/L (ref 15–41)
Albumin: 2.3 g/dL — ABNORMAL LOW (ref 3.5–5.0)
Alkaline Phosphatase: 157 U/L — ABNORMAL HIGH (ref 38–126)
Anion gap: 11 (ref 5–15)
BUN: 9 mg/dL (ref 6–20)
CO2: 17 mmol/L — ABNORMAL LOW (ref 22–32)
Calcium: 9.8 mg/dL (ref 8.9–10.3)
Chloride: 106 mmol/L (ref 98–111)
Creatinine, Ser: 0.54 mg/dL (ref 0.44–1.00)
GFR, Estimated: >60 mL/min (ref >60)
Glucose, Bld: 93 mg/dL (ref 70–99)
Potassium: 3.8 mmol/L (ref 3.5–5.1)
Sodium: 134 mmol/L — ABNORMAL LOW (ref 135–145)
Total Bilirubin: 0.3 mg/dL (ref 0.3–1.2)
Total Protein: 6.4 g/dL — ABNORMAL LOW (ref 6.5–8.1)

## 2023-04-10 LAB — PROTEIN / CREATININE RATIO, URINE
Creatinine, Urine: 34 mg/dL
Total Protein, Urine: <6 mg/dL

## 2023-04-10 MED ORDER — HYDRALAZINE HCL 20 MG/ML IJ SOLN: 10.0000 mg | INTRAMUSCULAR | Status: DC | PRN

## 2023-04-10 MED ORDER — LABETALOL HCL 5 MG/ML IV SOLN: 80.0000 mg | INTRAVENOUS | Status: DC | PRN

## 2023-04-10 MED ORDER — LABETALOL HCL 5 MG/ML IV SOLN: 20.0000 mg | INTRAVENOUS | Status: DC | PRN

## 2023-04-10 MED ORDER — LABETALOL HCL 5 MG/ML IV SOLN: 40.0000 mg | INTRAVENOUS | Status: DC | PRN

## 2023-04-10 MED ORDER — ACETAMINOPHEN-CAFFEINE 500-65 MG PO TABS
2.0000 | ORAL_TABLET | Freq: Once | ORAL | Status: AC
  Administered 2023-04-10: 2 via ORAL
  Filled 2023-04-10: qty 2

## 2023-04-10 NOTE — MAU Provider Note (Signed)
History     CSN: [id number]  Arrival date and time: 04/10/23 2440   Event Date/Time   First Provider Initiated Contact with Patient 04/10/23 [phone]      Chief Complaint  Patient presents with   Abdominal Pain   Back Pain   Rupture of Membranes   Vaginal Bleeding    Tracy [name] is a 37 y.o. [id number] at [redacted]w[redacted]d who receives care at [facility].  She presents today for various complaints.  She states she has had a HA since last night that feels like "pain."  She rates it a 8/10 and reports it causes dizziness.  She also reports some SOB last night, but none currently.   Patient endorses fetal movement. She states she started having contractions last night, 0400, with onset of watery discharge and bloody show.    OB History     Gravida  7   Para  3   Term  3   Preterm  0   AB  3   Living  3      SAB  3   IAB  0   Ectopic  0   Multiple  0   Live Births  3           Past Medical History:  Diagnosis Date   Chronic cystitis 12/15/2021   Contraception management 06/16/2021   Depression    Diabetes mellitus without complication (HCC)    diet controlled   Encounter for Nexplanon removal 06/16/2021   Gestational diabetes    Pelvic pain 12/15/2021   Teeth problem    cracked molars, pt unsure if just right side or both sides    Past Surgical History:  Procedure Laterality Date   NO PAST SURGERIES      Family History  Problem Relation Age of Onset   Diabetes Mother    Diabetes Father     Social History   Tobacco Use   Smoking status: Former    Types: Cigarettes   Smokeless tobacco: Never  Vaping Use   Vaping status: Never Used  Substance Use Topics   Alcohol use: No   Drug use: No    Allergies: No Known Allergies  Medications Prior to Admission  Medication Sig Dispense Refill Last Dose   Accu-Chek Softclix Lancets lancets 100 each by Other route 4 (four) times daily. 100 each 12 04/09/2023   aspirin EC 81 MG tablet Take 1 tablet  (81 mg total) by mouth daily. Take after 12 weeks for prevention of preeclampsia later in pregnancy 300 tablet 2 04/09/2023   ferrous sulfate 325 (65 FE) MG EC tablet Take 1 tablet (325 mg total) by mouth every other day. 30 tablet 0 04/09/2023   metFORMIN (GLUCOPHAGE) 1000 MG tablet Take 1 tablet (1,000 mg total) by mouth 2 (two) times daily with a meal. 60 tablet 3 04/09/2023   Prenatal Vit-Fe Fumarate-FA (PREPLUS) 27-1 MG TABS Take 1 tablet by mouth daily. 30 tablet 13 04/09/2023   Prenatal Vit-Fe Phos-FA-Omega (VITAFOL GUMMIES) 3.33-0.333-34.8 MG CHEW Chew 3 tablets by mouth daily in the afternoon. 90 tablet 11 04/09/2023   Blood Glucose Monitoring Suppl (ACCU-CHEK GUIDE) w/Device KIT 1 kit by Does not apply route daily. 1 kit 0    Blood Pressure Monitoring (BLOOD PRESSURE KIT) DEVI 1 kit by Does not apply route once a week. 1 each 0    cyclobenzaprine (FLEXERIL) 5 MG tablet Take 1 tablet (5 mg total) by mouth every 8 (eight) hours as needed for muscle spasms. (  Patient not taking: Reported on 01/22/2023) 30 tablet 0 Unknown   glucose blood (ACCU-CHEK GUIDE) test strip Use as instructed 100 each 12    miconazole (MONISTAT 7) 2 % vaginal cream Place 1 Applicatorful vaginally at bedtime. Apply for seven nights 30 g 2 Unknown    Review of Systems  Respiratory:  Positive for shortness of breath (Last night).   Gastrointestinal:  Positive for nausea (Prior to arrival) and vomiting (Prior to arrival).  Genitourinary:  Positive for vaginal discharge (Mucous). Negative for difficulty urinating, dysuria and vaginal bleeding.  Neurological:  Positive for dizziness and headaches. Negative for light-headedness.   Physical Exam   Blood pressure 133/82, pulse 94, temperature 98.4 F (36.9 C), temperature source Oral, resp. rate 17, height 4\' 10"  (1.473 m), weight 79.9 kg, last menstrual period 07/29/2022, SpO2 97%, unknown if currently breastfeeding.  Vitals:   04/10/23 0747 04/10/23 0801 04/10/23 0816 04/10/23  0823  BP: (!) 148/94 (!) 162/98 133/81 133/82     Physical Exam Vitals reviewed. Exam conducted with a chaperone present Vikki Ports, NT).  Constitutional:      Appearance: Normal appearance. She is well-developed.  HENT:     Head: Normocephalic and atraumatic.  Cardiovascular:     Rate and Rhythm: Normal rate.  Pulmonary:     Effort: Pulmonary effort is normal. No respiratory distress.     Breath sounds: Normal breath sounds. No wheezing.  Abdominal:     Palpations: Abdomen is soft.     Comments: Gravid--fundal height appears LGA, Soft, NT   Genitourinary:    General: Normal vulva.     Comments: Sterile Speculum Exam: -Normal External Genitalia: Non tender, no apparent discharge at introitus.  -Vaginal Vault: Pink mucosa with good rugae. Negative pooling. Minimal white discharge - fern collected -Cervix: Unable to visualize d/t posterior position -Bimanual Exam: Dilation: 1 Effacement (%): 60 Cervical Position: Posterior Exam by:: Gerrit Heck, CNM   Musculoskeletal:        General: Normal range of motion.     Cervical back: Normal range of motion.  Skin:    General: Skin is warm and dry.  Neurological:     Mental Status: She is alert and oriented to person, place, and time.  Psychiatric:        Mood and Affect: Mood normal.        Behavior: Behavior normal.     Fetal Assessment 145 bpm, Mod Var, -Decels, +15x15 Accels Toco: Irregular  MAU Course   Results for orders placed or performed during the hospital encounter of 04/10/23 (from the past 24 hour(s))  Protein / creatinine ratio, urine     Status: None   Collection Time: 04/10/23  8:25 AM  Result Value Ref Range   Creatinine, Urine 34 mg/dL   Total Protein, Urine <6 mg/dL   Protein Creatinine Ratio        0.00 - 0.15 mg/mg[Cre]  CBC with Differential/Platelet     Status: Abnormal   Collection Time: 04/10/23  8:31 AM  Result Value Ref Range   WBC 8.7 4.0 - 10.5 K/uL   RBC 3.80 (L) 3.87 - 5.11 MIL/uL    Hemoglobin 9.3 (L) 12.0 - 15.0 g/dL   HCT 16.1 (L) 09.6 - 04.5 %   MCV 77.9 (L) 80.0 - 100.0 fL   MCH 24.5 (L) 26.0 - 34.0 pg   MCHC 31.4 30.0 - 36.0 g/dL   RDW 40.9 81.1 - 91.4 %   Platelets 308 150 - 400 K/uL  nRBC 0.2 0.0 - 0.2 %   Neutrophils Relative % 67 %   Neutro Abs 5.9 1.7 - 7.7 K/uL   Lymphocytes Relative 23 %   Lymphs Abs 2.0 0.7 - 4.0 K/uL   Monocytes Relative 8 %   Monocytes Absolute 0.7 0.1 - 1.0 K/uL   Eosinophils Relative 1 %   Eosinophils Absolute 0.1 0.0 - 0.5 K/uL   Basophils Relative 0 %   Basophils Absolute 0.0 0.0 - 0.1 K/uL   Immature Granulocytes 1 %   Abs Immature Granulocytes 0.06 0.00 - 0.07 K/uL  Comprehensive metabolic panel     Status: Abnormal   Collection Time: 04/10/23  8:31 AM  Result Value Ref Range   Sodium 134 (L) 135 - 145 mmol/L   Potassium 3.8 3.5 - 5.1 mmol/L   Chloride 106 98 - 111 mmol/L   CO2 17 (L) 22 - 32 mmol/L   Glucose, Bld 93 70 - 99 mg/dL   BUN 9 6 - 20 mg/dL   Creatinine, Ser 1.61 0.44 - 1.00 mg/dL   Calcium 9.8 8.9 - 09.6 mg/dL   Total Protein 6.4 (L) 6.5 - 8.1 g/dL   Albumin 2.3 (L) 3.5 - 5.0 g/dL   AST 22 15 - 41 U/L   ALT 16 0 - 44 U/L   Alkaline Phosphatase 157 (H) 38 - 126 U/L   Total Bilirubin 0.3 0.3 - 1.2 mg/dL   GFR, Estimated >04 >54 mL/min   Anion gap 11 5 - 15   No results found.  MDM Physical Exam Labs: CBC, CMP, PC Ratio Measure BPQ15 min EFM Pain Management Assessment and Plan  37 year old [id number]  SIUP at 36.3 weeks Cat I FT Headache CHTN R/O ROM   -PreE labs ordered by previous provider; pending -POC Reviewed -Exam performed and findings discussed. -Patient offered and accepts pain medication.  -Will give Excedrin HA and reassess. -NST reactive.  -Fern collected negative. -Monitor and await results.    Cherre Robins MSN, CNM 04/10/2023, 8:32 AM   Reassessment (10:15 AM) -Results as above. -Provider to bedside. -Patient reporting vaginal pressure and pain that feels like  "shocking."  -Reassured that normal pregnancy complaint and discussed lightening crotch. -Cervical exam performed with chaperone, Whitney, RN, at bedside. -Informed that cervical exam unchanged and no presenting part appreciated. Leopolds suggestive of transverse presentation. -Instructed to go to afternoon appt as she needs to be scheduled for IOL as appropriate. -Patient reports HA has improved and now 4-5/10.   -Precautions reviewed. -BP normotensive. -Encouraged to call primary office or return to MAU if symptoms worsen or with the onset of new symptoms. -Discharged to home in stable condition.  Cherre Robins MSN, CNM Advanced Practice Provider, [facility] [employer]

## 2023-04-10 NOTE — Progress Notes (Signed)
Pt. Present for Rob. Went to the ER this morning for bloody mucus. Having trouble breathing at night. Having headaches and feeling weak.

## 2023-04-10 NOTE — MAU Note (Signed)
Tracy [name] is a 37 y.o. at [redacted]w[redacted]d here in MAU reporting: ongoing back pain that worsened yesterday that is constant. Reports lower abdominal pain that comes and goes. Reports contractions but has not been timing them. Reports leaking clear watery fluid at 0400 patient not currently wearing a pad. Also reports bloody mucus discharge.  Endorses +FM   Took Tyelnol 325mg  at 0400 States last time she was in MAU her cervix was closed.   Onset of complaint: 0400 Pain score: 8 back  8 abdominal Vitals:   04/10/23 0747  BP: (!) 148/94  Pulse: 87  Resp: 17  Temp: 98.4 F (36.9 C)     FHT:132 Lab orders placed from triage:  Crist Fat, Labor eval

## 2023-04-10 NOTE — Progress Notes (Signed)
PRENATAL VISIT NOTE  Subjective:  Tracy [name] is a 37 y.o. [phone] at [redacted]w[redacted]d being seen today for ongoing prenatal care.  She is currently monitored for the following issues for this high-risk pregnancy and has Gestational diabetes-Metformin; History of gestational diabetes; LGA (large for gestational age) fetus affecting management of mother; Chronic hypertension affecting pregnancy; Obesity affecting pregnancy in second trimester; Supervision of high risk pregnancy, antepartum, second trimester; AMA (advanced maternal age) multigravida 35+; and Symptomatic anemia on their problem list.  Patient reports  worsening shortness of breath. She reports she cannot lie down to sleep because of the SOB she has, even at rest. She feels dizzy and like she may pass out at times.   Contractions: Irritability. Vag. Bleeding: Bloody Show (Bloody mucus twice last night).  Movement: Present. Denies leaking of fluid.   The following portions of the patient's history were reviewed and updated as appropriate: allergies, current medications, past family history, past medical history, past social history, past surgical history and problem list.   Objective:   Vitals:   04/10/23 1512 04/10/23 1520  BP: (!) 145/96 (!) 140/95  Pulse: 98 (!) 102  Weight: 177 lb (80.3 kg)     Fetal Status: Fetal Heart Rate (bpm): 139   Movement: Present     General:  Alert, oriented and cooperative. Patient is in no acute distress.  Skin: Skin is warm and dry. No rash noted.   Cardiovascular: Normal heart rate noted  Respiratory: Normal respiratory effort, no problems with respiration noted  Abdomen: Soft, gravid, appropriate for gestational age.  Pain/Pressure: Present     Pelvic: Cervical exam deferred        Extremities: Normal range of motion.  Edema: Trace  Mental Status: Normal mood and affect. Normal behavior. Normal judgment and thought content.   Assessment and Plan:  Pregnancy: [id number] at [redacted]w[redacted]d 1.  Supervision of high risk pregnancy, antepartum, second trimester --Anticipatory guidance about next visits/weeks of pregnancy given.   2. Gestational diabetes mellitus (GDM) in third trimester controlled on oral hypoglycemic drug --Reviewed glucose log. Fasting values all elevated, mostly in the 120s. PP values also mostly above 120, highest in the 130s.   --Pt taking metformin 1000 mg BID as prescribed --Consult Dr Jolayne Panther and IOL at 37 for uncontrolled GDM vs starting insulin is reasonable --IOL scheduled at [redacted]w[redacted]d on 04/15/23, orders placed  3. Chronic hypertension affecting pregnancy --Not on medications --No s/sx of PEC  4. Shortness of breath during pregnancy --Pt has reported SOB for weeks, but gradually worsening --HR is wnl, lung sounds clear --Anemia is likely cause of dizziness and SOB --Orders placed for IV iron infusion and message left to get pt in this week --Reviewed reasons to seek care in MAU/ED  5. Anemia affecting pregnancy in third trimester --Hgb 9.3  6. [redacted] weeks gestation of pregnancy   Term labor symptoms and general obstetric precautions including but not limited to vaginal bleeding, contractions, leaking of fluid and fetal movement were reviewed in detail with the patient. Please refer to After Visit Summary for other counseling recommendations.   Return for As scheduled.  Future Appointments  Date Time Provider Department Center  04/11/2023  2:45 PM [facility] US2 [facility]  04/17/2023  2:50 PM Warden Fillers, MD [facility] None  04/18/2023  3:15 PM [facility] NURSE [facility]  04/18/2023  3:30 PM [facility] US6 [facility]  04/24/2023  2:30 PM Adam Phenix, MD [facility] None  05/01/2023  2:50 PM Lennart Pall,  MD [facility] None  05/08/2023  2:50 PM Adam Phenix, MD [facility] None    Sharen Counter, CNM

## 2023-04-11 ENCOUNTER — Inpatient Hospital Stay (HOSPITAL_COMMUNITY): Payer: Medicaid Other

## 2023-04-11 ENCOUNTER — Encounter (HOSPITAL_COMMUNITY): Payer: Self-pay | Admitting: Obstetrics and Gynecology

## 2023-04-11 ENCOUNTER — Inpatient Hospital Stay (HOSPITAL_COMMUNITY): Payer: Medicaid Other | Admitting: Anesthesiology

## 2023-04-11 ENCOUNTER — Other Ambulatory Visit: Payer: Medicaid Other

## 2023-04-11 ENCOUNTER — Inpatient Hospital Stay (HOSPITAL_COMMUNITY)
Admission: AD | Admit: 2023-04-11 | Discharge: 2023-04-11 | Disposition: A | Discharge status: Home / Self Care | Payer: Medicaid Other | Source: Home / Self Care | Attending: Obstetrics and Gynecology | Admitting: Obstetrics and Gynecology

## 2023-04-11 ENCOUNTER — Telehealth: Payer: Self-pay

## 2023-04-11 ENCOUNTER — Inpatient Hospital Stay (HOSPITAL_COMMUNITY)
Admission: AD | Admit: 2023-04-11 | Discharge: 2023-04-13 | DRG: 797 | Disposition: A | Discharge status: Home / Self Care | Payer: Medicaid Other | Attending: Obstetrics and Gynecology | Admitting: Obstetrics and Gynecology

## 2023-04-11 DIAGNOSIS — O24414 Gestational diabetes mellitus in pregnancy, insulin controlled: Secondary | ICD-10-CM | POA: Diagnosis not present

## 2023-04-11 DIAGNOSIS — O24415 Gestational diabetes mellitus in pregnancy, controlled by oral hypoglycemic drugs: Secondary | ICD-10-CM | POA: Diagnosis present

## 2023-04-11 DIAGNOSIS — O321XX0 Maternal care for breech presentation, not applicable or unspecified: Secondary | ICD-10-CM | POA: Diagnosis present

## 2023-04-11 DIAGNOSIS — O403XX0 Polyhydramnios, third trimester, not applicable or unspecified: Secondary | ICD-10-CM | POA: Diagnosis present

## 2023-04-11 DIAGNOSIS — O26893 Other specified pregnancy related conditions, third trimester: Secondary | ICD-10-CM | POA: Insufficient documentation

## 2023-04-11 DIAGNOSIS — O134 Gestational [pregnancy-induced] hypertension without significant proteinuria, complicating childbirth: Secondary | ICD-10-CM | POA: Diagnosis not present

## 2023-04-11 DIAGNOSIS — Z3A36 36 weeks gestation of pregnancy: Secondary | ICD-10-CM

## 2023-04-11 DIAGNOSIS — O3660X0 Maternal care for excessive fetal growth, unspecified trimester, not applicable or unspecified: Secondary | ICD-10-CM | POA: Diagnosis present

## 2023-04-11 DIAGNOSIS — O99214 Obesity complicating childbirth: Secondary | ICD-10-CM | POA: Diagnosis present

## 2023-04-11 DIAGNOSIS — O24425 Gestational diabetes mellitus in childbirth, controlled by oral hypoglycemic drugs: Secondary | ICD-10-CM | POA: Diagnosis present

## 2023-04-11 DIAGNOSIS — R0789 Other chest pain: Secondary | ICD-10-CM | POA: Insufficient documentation

## 2023-04-11 DIAGNOSIS — O3663X0 Maternal care for excessive fetal growth, third trimester, not applicable or unspecified: Secondary | ICD-10-CM | POA: Diagnosis present

## 2023-04-11 DIAGNOSIS — O0992 Supervision of high risk pregnancy, unspecified, second trimester: Secondary | ICD-10-CM

## 2023-04-11 DIAGNOSIS — O42919 Preterm premature rupture of membranes, unspecified as to length of time between rupture and onset of labor, unspecified trimester: Principal | ICD-10-CM

## 2023-04-11 DIAGNOSIS — O321XX1 Maternal care for breech presentation, fetus 1: Secondary | ICD-10-CM

## 2023-04-11 DIAGNOSIS — O42913 Preterm premature rupture of membranes, unspecified as to length of time between rupture and onset of labor, third trimester: Principal | ICD-10-CM | POA: Diagnosis present

## 2023-04-11 DIAGNOSIS — O09529 Supervision of elderly multigravida, unspecified trimester: Secondary | ICD-10-CM

## 2023-04-11 DIAGNOSIS — Z87891 Personal history of nicotine dependence: Secondary | ICD-10-CM

## 2023-04-11 DIAGNOSIS — Z23 Encounter for immunization: Secondary | ICD-10-CM

## 2023-04-11 DIAGNOSIS — Z833 Family history of diabetes mellitus: Secondary | ICD-10-CM

## 2023-04-11 DIAGNOSIS — D649 Anemia, unspecified: Secondary | ICD-10-CM | POA: Diagnosis present

## 2023-04-11 DIAGNOSIS — O4202 Full-term premature rupture of membranes, onset of labor within 24 hours of rupture: Secondary | ICD-10-CM | POA: Diagnosis not present

## 2023-04-11 DIAGNOSIS — O1092 Unspecified pre-existing hypertension complicating childbirth: Secondary | ICD-10-CM | POA: Diagnosis present

## 2023-04-11 DIAGNOSIS — Z302 Encounter for sterilization: Secondary | ICD-10-CM | POA: Diagnosis not present

## 2023-04-11 DIAGNOSIS — O9902 Anemia complicating childbirth: Secondary | ICD-10-CM | POA: Diagnosis present

## 2023-04-11 DIAGNOSIS — R0602 Shortness of breath: Secondary | ICD-10-CM | POA: Diagnosis present

## 2023-04-11 DIAGNOSIS — O99212 Obesity complicating pregnancy, second trimester: Secondary | ICD-10-CM | POA: Diagnosis present

## 2023-04-11 DIAGNOSIS — O09523 Supervision of elderly multigravida, third trimester: Secondary | ICD-10-CM | POA: Diagnosis not present

## 2023-04-11 DIAGNOSIS — O24424 Gestational diabetes mellitus in childbirth, insulin controlled: Secondary | ICD-10-CM | POA: Diagnosis not present

## 2023-04-11 DIAGNOSIS — O133 Gestational [pregnancy-induced] hypertension without significant proteinuria, third trimester: Secondary | ICD-10-CM | POA: Diagnosis not present

## 2023-04-11 DIAGNOSIS — O10919 Unspecified pre-existing hypertension complicating pregnancy, unspecified trimester: Secondary | ICD-10-CM | POA: Diagnosis present

## 2023-04-11 LAB — URINALYSIS, ROUTINE W REFLEX MICROSCOPIC
Bilirubin Urine: NEGATIVE
Glucose, UA: 50 mg/dL — AB
Hgb urine dipstick: NEGATIVE
Ketones, ur: NEGATIVE mg/dL
Nitrite: NEGATIVE
Protein, ur: NEGATIVE mg/dL
Specific Gravity, Urine: 1.006 (ref 1.005–1.030)
pH: 7 (ref 5.0–8.0)

## 2023-04-11 LAB — GLUCOSE, CAPILLARY
Glucose-Capillary: 114 mg/dL — ABNORMAL HIGH (ref 70–99)
Glucose-Capillary: 74 mg/dL (ref 70–99)
Glucose-Capillary: 146 mg/dL — ABNORMAL HIGH (ref 70–99)
Glucose-Capillary: 76 mg/dL (ref 70–99)

## 2023-04-11 LAB — CBC
HCT: 29.7 % — ABNORMAL LOW (ref 36.0–46.0)
HCT: 30.7 % — ABNORMAL LOW (ref 36.0–46.0)
Hemoglobin: 9 g/dL — ABNORMAL LOW (ref 12.0–15.0)
Hemoglobin: 9.3 g/dL — ABNORMAL LOW (ref 12.0–15.0)
MCH: 23.7 pg — ABNORMAL LOW (ref 26.0–34.0)
MCH: 23.2 pg — ABNORMAL LOW (ref 26.0–34.0)
MCHC: 30.3 g/dL (ref 30.0–36.0)
MCHC: 30.3 g/dL (ref 30.0–36.0)
MCV: 78.4 fL — ABNORMAL LOW (ref 80.0–100.0)
MCV: 76.6 fL — ABNORMAL LOW (ref 80.0–100.0)
Platelets: 315 10*3/uL (ref 150–400)
Platelets: 303 10*3/uL (ref 150–400)
RBC: 3.79 MIL/uL — ABNORMAL LOW (ref 3.87–5.11)
RBC: 4.01 MIL/uL (ref 3.87–5.11)
RDW: 14.5 % (ref 11.5–15.5)
RDW: 14.5 % (ref 11.5–15.5)
WBC: 8.7 10*3/uL (ref 4.0–10.5)
WBC: 9.2 10*3/uL (ref 4.0–10.5)
nRBC: 0 % (ref 0.0–0.2)
nRBC: 0 % (ref 0.0–0.2)

## 2023-04-11 LAB — COMPREHENSIVE METABOLIC PANEL
ALT: 17 U/L (ref 0–44)
AST: 21 U/L (ref 15–41)
Albumin: 2.3 g/dL — ABNORMAL LOW (ref 3.5–5.0)
Alkaline Phosphatase: 136 U/L — ABNORMAL HIGH (ref 38–126)
Anion gap: 14 (ref 5–15)
BUN: 10 mg/dL (ref 6–20)
CO2: 17 mmol/L — ABNORMAL LOW (ref 22–32)
Calcium: 9 mg/dL (ref 8.9–10.3)
Chloride: 103 mmol/L (ref 98–111)
Creatinine, Ser: 0.61 mg/dL (ref 0.44–1.00)
GFR, Estimated: >60 mL/min (ref >60)
Glucose, Bld: 104 mg/dL — ABNORMAL HIGH (ref 70–99)
Potassium: 4 mmol/L (ref 3.5–5.1)
Sodium: 134 mmol/L — ABNORMAL LOW (ref 135–145)
Total Bilirubin: 0.3 mg/dL (ref 0.3–1.2)
Total Protein: 5.8 g/dL — ABNORMAL LOW (ref 6.5–8.1)

## 2023-04-11 LAB — PROTEIN / CREATININE RATIO, URINE
Creatinine, Urine: 23 mg/dL
Total Protein, Urine: <6 mg/dL

## 2023-04-11 LAB — GROUP B STREP BY PCR: Group B strep by PCR: NEGATIVE

## 2023-04-11 LAB — TROPONIN I (HIGH SENSITIVITY): Troponin I (High Sensitivity): 6 ng/L (ref <18)

## 2023-04-11 LAB — POCT FERN TEST: POCT Fern Test: POSITIVE

## 2023-04-11 LAB — TYPE AND SCREEN
ABO/RH(D): O POS
Antibody Screen: NEGATIVE

## 2023-04-11 LAB — RPR: RPR Ser Ql: NONREACTIVE

## 2023-04-11 LAB — BRAIN NATRIURETIC PEPTIDE: B Natriuretic Peptide: 20 pg/mL (ref 0.0–100.0)

## 2023-04-11 MED ORDER — PRENATAL MULTIVITAMIN CH
1.0000 | ORAL_TABLET | Freq: Every day | ORAL | Status: DC
  Administered 2023-04-13: 1 via ORAL
  Filled 2023-04-11: qty 1

## 2023-04-11 MED ORDER — BENZOCAINE-MENTHOL 20-0.5 % EX AERO
1.0000 | INHALATION_SPRAY | CUTANEOUS | Status: DC | PRN
  Administered 2023-04-12: 1 via TOPICAL
  Filled 2023-04-11: qty 56

## 2023-04-11 MED ORDER — PENICILLIN G POT IN DEXTROSE 60000 UNIT/ML IV SOLN
3.0000 10*6.[IU] | INTRAVENOUS | Status: DC
  Administered 2023-04-11: 3 10*6.[IU] via INTRAVENOUS
  Filled 2023-04-11: qty 50

## 2023-04-11 MED ORDER — EPHEDRINE 5 MG/ML INJ: 10.0000 mg | INTRAVENOUS | Status: DC | PRN

## 2023-04-11 MED ORDER — ALUM & MAG HYDROXIDE-SIMETH 200-200-20 MG/5ML PO SUSP
30.0000 mL | Freq: Once | ORAL | Status: AC
  Administered 2023-04-11: 30 mL via ORAL
  Filled 2023-04-11: qty 30

## 2023-04-11 MED ORDER — SOD CITRATE-CITRIC ACID 500-334 MG/5ML PO SOLN: 30.0000 mL | ORAL | Status: DC | PRN

## 2023-04-11 MED ORDER — ONDANSETRON HCL 4 MG/2ML IJ SOLN: 4.0000 mg | INTRAMUSCULAR | Status: DC | PRN

## 2023-04-11 MED ORDER — OXYCODONE-ACETAMINOPHEN 5-325 MG PO TABS: 1.0000 | ORAL_TABLET | ORAL | Status: DC | PRN

## 2023-04-11 MED ORDER — SODIUM CHLORIDE 0.9 % IV SOLN
5.0000 10*6.[IU] | Freq: Once | INTRAVENOUS | Status: AC
  Administered 2023-04-11: 5 10*6.[IU] via INTRAVENOUS
  Filled 2023-04-11: qty 5

## 2023-04-11 MED ORDER — IBUPROFEN 600 MG PO TABS
600.0000 mg | ORAL_TABLET | Freq: Four times a day (QID) | ORAL | Status: DC
  Administered 2023-04-12 – 2023-04-13 (×5): 600 mg via ORAL
  Filled 2023-04-11 (×6): qty 1

## 2023-04-11 MED ORDER — ACETAMINOPHEN 325 MG PO TABS
650.0000 mg | ORAL_TABLET | ORAL | Status: DC | PRN
  Administered 2023-04-12 – 2023-04-13 (×4): 650 mg via ORAL
  Filled 2023-04-11 (×5): qty 2

## 2023-04-11 MED ORDER — MISOPROSTOL 25 MCG QUARTER TABLET: 25.0000 ug | ORAL_TABLET | Freq: Once | ORAL | Status: DC

## 2023-04-11 MED ORDER — ZOLPIDEM TARTRATE 5 MG PO TABS: 5.0000 mg | ORAL_TABLET | Freq: Every evening | ORAL | Status: DC | PRN

## 2023-04-11 MED ORDER — LACTATED RINGERS IV SOLN: INTRAVENOUS | Status: DC

## 2023-04-11 MED ORDER — ACETAMINOPHEN 325 MG PO TABS
650.0000 mg | ORAL_TABLET | ORAL | Status: DC | PRN
  Administered 2023-04-11 (×2): 650 mg via ORAL
  Filled 2023-04-11 (×2): qty 2

## 2023-04-11 MED ORDER — TETANUS-DIPHTH-ACELL PERTUSSIS 5-2.5-18.5 LF-MCG/0.5 IM SUSY: 0.5000 mL | PREFILLED_SYRINGE | Freq: Once | INTRAMUSCULAR | Status: DC

## 2023-04-11 MED ORDER — LIDOCAINE HCL (PF) 1 % IJ SOLN
INTRAMUSCULAR | Status: DC | PRN
  Administered 2023-04-11 (×2): 4 mL via EPIDURAL

## 2023-04-11 MED ORDER — SIMETHICONE 80 MG PO CHEW: 80.0000 mg | CHEWABLE_TABLET | ORAL | Status: DC | PRN

## 2023-04-11 MED ORDER — TERBUTALINE SULFATE 1 MG/ML IJ SOLN: 0.2500 mg | Freq: Once | INTRAMUSCULAR | Status: DC | PRN

## 2023-04-11 MED ORDER — PHENYLEPHRINE 80 MCG/ML (10ML) SYRINGE FOR IV PUSH (FOR BLOOD PRESSURE SUPPORT): 80.0000 ug | PREFILLED_SYRINGE | INTRAVENOUS | Status: DC | PRN

## 2023-04-11 MED ORDER — WITCH HAZEL-GLYCERIN EX PADS: 1.0000 | MEDICATED_PAD | CUTANEOUS | Status: DC | PRN

## 2023-04-11 MED ORDER — OXYTOCIN-SODIUM CHLORIDE 30-0.9 UT/500ML-% IV SOLN
1.0000 m[IU]/min | INTRAVENOUS | Status: DC
  Administered 2023-04-11: 32 m[IU]/min via INTRAVENOUS
  Filled 2023-04-11: qty 500

## 2023-04-11 MED ORDER — SENNOSIDES-DOCUSATE SODIUM 8.6-50 MG PO TABS
2.0000 | ORAL_TABLET | Freq: Every day | ORAL | Status: DC
  Administered 2023-04-13: 2 via ORAL
  Filled 2023-04-11: qty 2

## 2023-04-11 MED ORDER — DIPHENHYDRAMINE HCL 50 MG/ML IJ SOLN
12.5000 mg | INTRAMUSCULAR | Status: DC | PRN
  Administered 2023-04-11: 12.5 mg via INTRAVENOUS
  Filled 2023-04-11: qty 1

## 2023-04-11 MED ORDER — OXYTOCIN-SODIUM CHLORIDE 30-0.9 UT/500ML-% IV SOLN
1.0000 m[IU]/min | INTRAVENOUS | Status: DC
  Administered 2023-04-11: 2 m[IU]/min via INTRAVENOUS

## 2023-04-11 MED ORDER — OXYTOCIN-SODIUM CHLORIDE 30-0.9 UT/500ML-% IV SOLN
1.0000 m[IU]/min | INTRAVENOUS | Status: DC
  Administered 2023-04-11: 18 m[IU]/min via INTRAVENOUS
  Administered 2023-04-11: 14 m[IU]/min via INTRAVENOUS

## 2023-04-11 MED ORDER — ONDANSETRON HCL 4 MG/2ML IJ SOLN: 4.0000 mg | Freq: Four times a day (QID) | INTRAMUSCULAR | Status: DC | PRN

## 2023-04-11 MED ORDER — COCONUT OIL OIL: 1.0000 | TOPICAL_OIL | Status: DC | PRN

## 2023-04-11 MED ORDER — OXYTOCIN BOLUS FROM INFUSION
333.0000 mL | Freq: Once | INTRAVENOUS | Status: AC
  Administered 2023-04-11: 333 mL via INTRAVENOUS

## 2023-04-11 MED ORDER — LACTATED RINGERS IV SOLN
500.0000 mL | Freq: Once | INTRAVENOUS | Status: AC
  Administered 2023-04-11: 500 mL via INTRAVENOUS

## 2023-04-11 MED ORDER — DIBUCAINE (PERIANAL) 1 % EX OINT: 1.0000 | TOPICAL_OINTMENT | CUTANEOUS | Status: DC | PRN

## 2023-04-11 MED ORDER — FENTANYL-BUPIVACAINE-NACL 0.5-0.125-0.9 MG/250ML-% EP SOLN
12.0000 mL/h | EPIDURAL | Status: DC | PRN
  Administered 2023-04-11: 12 mL/h via EPIDURAL
  Filled 2023-04-11: qty 250

## 2023-04-11 MED ORDER — FUROSEMIDE 20 MG PO TABS
40.0000 mg | ORAL_TABLET | Freq: Every day | ORAL | Status: DC
  Administered 2023-04-12 – 2023-04-13 (×2): 40 mg via ORAL
  Filled 2023-04-11 (×2): qty 2

## 2023-04-11 MED ORDER — ONDANSETRON HCL 4 MG PO TABS: 4.0000 mg | ORAL_TABLET | ORAL | Status: DC | PRN

## 2023-04-11 MED ORDER — OXYTOCIN 10 UNIT/ML IJ SOLN
10.0000 [IU] | Freq: Once | INTRAMUSCULAR | Status: AC
  Administered 2023-04-11: 10 [IU] via INTRAMUSCULAR

## 2023-04-11 MED ORDER — OXYTOCIN-SODIUM CHLORIDE 30-0.9 UT/500ML-% IV SOLN
2.5000 [IU]/h | INTRAVENOUS | Status: DC
  Filled 2023-04-11: qty 500

## 2023-04-11 MED ORDER — OXYCODONE-ACETAMINOPHEN 5-325 MG PO TABS: 2.0000 | ORAL_TABLET | ORAL | Status: DC | PRN

## 2023-04-11 MED ORDER — OXYTOCIN 10 UNIT/ML IJ SOLN
INTRAMUSCULAR | Status: AC
  Filled 2023-04-11: qty 1

## 2023-04-11 MED ORDER — MISOPROSTOL 50MCG HALF TABLET: 50.0000 ug | ORAL_TABLET | Freq: Once | ORAL | Status: DC

## 2023-04-11 MED ORDER — DIPHENHYDRAMINE HCL 25 MG PO CAPS: 25.0000 mg | ORAL_CAPSULE | Freq: Four times a day (QID) | ORAL | Status: DC | PRN

## 2023-04-11 MED ORDER — LIDOCAINE HCL (PF) 1 % IJ SOLN: 30.0000 mL | INTRAMUSCULAR | Status: DC | PRN

## 2023-04-11 MED ORDER — LACTATED RINGERS IV SOLN: 500.0000 mL | INTRAVENOUS | Status: DC | PRN

## 2023-04-11 NOTE — MAU Note (Signed)
.  Tracy [name] is a 37 y.o. at [redacted]w[redacted]d here in MAU reporting difficulty breathing since 1230. Pt was lying down and was hard to get her breath. Pt states she has anemia and is to get iron infusion. Reports good FM and some bloody mucousy d/c. Was checked Tues and 1cm. For IOL SUn. Some lower back pain  Onset of complaint: 1230 Pain score: 9 Vitals:   04/11/23 0105  BP: 130/87  Pulse: 99  Resp: 17  Temp: 97.7 F (36.5 C)  SpO2: 100%     FHT:125 Lab orders placed from triage:  none

## 2023-04-11 NOTE — Progress Notes (Signed)
Patient ID: Tracy [name], female   DOB: Nov 17, 1985, 37 y.o.   MRN: [id number]  Patient complete at 1915 with initial station -3. Started pushing with good maternal effort. Continues to have good effort after an hour of pushing -currently at 0 station.  Anticipate breech vaginal.  Levie Heritage, DO

## 2023-04-11 NOTE — MAU Provider Note (Signed)
Pt informed that the ultrasound is considered a limited OB ultrasound and is not intended to be a complete ultrasound exam.  Patient also informed that the ultrasound is not being completed with the intent of assessing for fetal or placental anomalies or any pelvic abnormalities.  Explained that the purpose of today's ultrasound is to assess for presentation, BPP and amniotic fluid volume.  Patient acknowledges the purpose of the exam and the limitations of the study.    Fetal head is oblique in left lower quadrant.

## 2023-04-11 NOTE — Progress Notes (Signed)
Labor Progress Note Tracy [name] is a 37 y.o. [id number] at [redacted]w[redacted]d presented for IOL for SROM S: Coping well, strongly desires vaginal delivery  O:  BP 125/77   Pulse (!) 107   Temp (!) 97.5 F (36.4 C)   Resp 16   LMP 07/29/2022   SpO2 97%  EFM: 120/moderate variability/accels present/intermittent   CVE: Dilation: 3.5 Effacement (%): 60 Cervical Position: Posterior Station: Ballotable Presentation: [place] Exam by:: Dr. Adrian Blackwater   A&P: 37 y.o. [id number] [redacted]w[redacted]d admitted for IOL for SROM #Labor: Progressing well. Discussed risks and benefits of breech vaginal delivery, patient agreeable. 4x4 pitocin. #Pain: Epidural #FWB: Cat I #GBS negative  #GDMA2: q4hr CBG in latent labor, q2h in active labor. Last CBG 146 #cHTN: mild range BP, Urine pro/cr <6 mg/dL, Plt 130, AST 21, AL 17  Wyn Forster, MD 2:30 PM

## 2023-04-11 NOTE — MAU Provider Note (Signed)
Chief Complaint:  Shortness of Breath   Event Date/Time   First Provider Initiated Contact with Patient 04/11/23 0114     HPI: Tracy [name] is a 37 y.o. [id number] at 55w4dwho presents to maternity admissions reporting shortness of breath since noon.  Per record review, has complained of this for weeks. No cough or fever. Has some tightness/pain over upper chest. This SOB causes her to wake up.  . She reports good fetal movement, denies LOF, vaginal bleeding, urinary symptoms, h/a, dizziness, n/v, or fever/chills.    Shortness of Breath This is a recurrent problem. The current episode started 1 to 4 weeks ago. The problem occurs constantly. The problem has been unchanged. Associated symptoms include chest pain. Pertinent negatives include no abdominal pain, fever, headaches, hemoptysis, leg swelling, rhinorrhea, sputum production, syncope or wheezing. Nothing aggravates the symptoms. She has tried nothing for the symptoms. There is no history of asthma.   RN Note: Tracy [name] is a 37 y.o. at [redacted]w[redacted]d here in MAU reporting difficulty breathing since 1230. Pt was lying down and was hard to get her breath. Pt states she has anemia and is to get iron infusion. Reports good FM and some bloody mucousy d/c. Was checked Tues and 1cm. For IOL SUn. Some lower back pain  Onset of complaint: 1230  Past Medical History: Past Medical History:  Diagnosis Date   Chronic cystitis 12/15/2021   Contraception management 06/16/2021   Depression    Diabetes mellitus without complication (HCC)    diet controlled   Encounter for Nexplanon removal 06/16/2021   Gestational diabetes    Pelvic pain 12/15/2021   Teeth problem    cracked molars, pt unsure if just right side or both sides    Past obstetric history: OB History  Gravida Para Term Preterm AB Living  7 3 3  0 3 3  SAB IAB Ectopic Multiple Live Births  3 0 0 0 3    # Outcome Date GA Lbr Len/2nd Weight Sex Type Anes PTL Lv  7  Current           6 Term 01/18/20 [redacted]w[redacted]d / 00:17 3799 g M Vag-Spont EPI  LIV     Birth Comments: WNL  5 Term 06/24/16 [redacted]w[redacted]d 03:47 / 01:04 3595 g F Vag-Spont EPI  LIV  4 Term 02/20/13 [redacted]w[redacted]d 09:47 / 01:08 3260 g M Vag-Spont Local, EPI  LIV  3 SAB           2 SAB           1 SAB             Past Surgical History: Past Surgical History:  Procedure Laterality Date   NO PAST SURGERIES      Family History: Family History  Problem Relation Age of Onset   Diabetes Mother    Diabetes Father     Social History: Social History   Tobacco Use   Smoking status: Former    Types: Cigarettes   Smokeless tobacco: Never  Vaping Use   Vaping status: Never Used  Substance Use Topics   Alcohol use: No   Drug use: No    Allergies: No Known Allergies  Meds:  Medications Prior to Admission  Medication Sig Dispense Refill Last Dose   aspirin EC 81 MG tablet Take 1 tablet (81 mg total) by mouth daily. Take after 12 weeks for prevention of preeclampsia later in pregnancy 300 tablet 2 04/10/2023   ferrous sulfate 325 (65 FE) MG EC tablet  Take 1 tablet (325 mg total) by mouth every other day. 30 tablet 0 04/10/2023   metFORMIN (GLUCOPHAGE) 1000 MG tablet Take 1 tablet (1,000 mg total) by mouth 2 (two) times daily with a meal. 60 tablet 3 04/10/2023   Prenatal Vit-Fe Phos-FA-Omega (VITAFOL GUMMIES) 3.33-0.333-34.8 MG CHEW Chew 3 tablets by mouth daily in the afternoon. 90 tablet 11 04/10/2023   Accu-Chek Softclix Lancets lancets 100 each by Other route 4 (four) times daily. 100 each 12    Blood Glucose Monitoring Suppl (ACCU-CHEK GUIDE) w/Device KIT 1 kit by Does not apply route daily. 1 kit 0    Blood Pressure Monitoring (BLOOD PRESSURE KIT) DEVI 1 kit by Does not apply route once a week. 1 each 0    cyclobenzaprine (FLEXERIL) 5 MG tablet Take 1 tablet (5 mg total) by mouth every 8 (eight) hours as needed for muscle spasms. (Patient not taking: Reported on 01/22/2023) 30 tablet 0    glucose blood  (ACCU-CHEK GUIDE) test strip Use as instructed 100 each 12    miconazole (MONISTAT 7) 2 % vaginal cream Place 1 Applicatorful vaginally at bedtime. Apply for seven nights 30 g 2    Prenatal Vit-Fe Fumarate-FA (PREPLUS) 27-1 MG TABS Take 1 tablet by mouth daily. (Patient not taking: Reported on 04/10/2023) 30 tablet 13     I have reviewed patient's Past Medical Hx, Surgical Hx, Family Hx, Social Hx, medications and allergies.   ROS:  Review of Systems  Constitutional:  Negative for fever.  HENT:  Negative for rhinorrhea.   Respiratory:  Positive for shortness of breath. Negative for hemoptysis, sputum production and wheezing.   Cardiovascular:  Positive for chest pain. Negative for leg swelling and syncope.  Gastrointestinal:  Negative for abdominal pain.  Neurological:  Negative for headaches.   Other systems negative  Physical Exam  Patient Vitals for the past 24 hrs:  BP Temp Pulse Resp SpO2 Height Weight  04/11/23 0105 130/87 97.7 F (36.5 C) 99 17 100 % 4\' 10"  (1.473 m) 80.2 kg   Constitutional: Well-developed, well-nourished female in no acute distress.  Cardiovascular: normal rate and rhythm Respiratory: normal effort, clear to auscultation bilaterally GI: Abd soft, non-tender, gravid appropriate for gestational age.   No rebound or guarding. MS: Extremities nontender, no edema, normal ROM Neurologic: Alert and oriented x 4.  GU: Neg CVAT.    FHT:  Baseline 140 , moderate variability, accelerations present, no decelerations Contractions: Irregular     Labs: Results for orders placed or performed during the hospital encounter of 04/11/23 (from the past 24 hour(s))  Urinalysis, Routine w reflex microscopic -Urine, Clean Catch     Status: Abnormal   Collection Time: 04/11/23  1:55 AM  Result Value Ref Range   Color, Urine YELLOW YELLOW   APPearance CLEAR CLEAR   Specific Gravity, Urine 1.006 1.005 - 1.030   pH 7.0 5.0 - 8.0   Glucose, UA 50 (A) NEGATIVE mg/dL   Hgb  urine dipstick NEGATIVE NEGATIVE   Bilirubin Urine NEGATIVE NEGATIVE   Ketones, ur NEGATIVE NEGATIVE mg/dL   Protein, ur NEGATIVE NEGATIVE mg/dL   Nitrite NEGATIVE NEGATIVE   Leukocytes,Ua TRACE (A) NEGATIVE   RBC / HPF 0-5 0 - 5 RBC/hpf   WBC, UA 0-5 0 - 5 WBC/hpf   Bacteria, UA RARE (A) NONE SEEN   Squamous Epithelial / HPF 6-10 0 - 5 /HPF  CBC     Status: Abnormal   Collection Time: 04/11/23  2:02 AM  Result  Value Ref Range   WBC 8.7 4.0 - 10.5 K/uL   RBC 3.79 (L) 3.87 - 5.11 MIL/uL   Hemoglobin 9.0 (L) 12.0 - 15.0 g/dL   HCT 45.4 (L) 09.8 - 11.9 %   MCV 78.4 (L) 80.0 - 100.0 fL   MCH 23.7 (L) 26.0 - 34.0 pg   MCHC 30.3 30.0 - 36.0 g/dL   RDW 14.7 82.9 - 56.2 %   Platelets 315 150 - 400 K/uL   nRBC 0.0 0.0 - 0.2 %  Comprehensive metabolic panel     Status: Abnormal   Collection Time: 04/11/23  2:02 AM  Result Value Ref Range   Sodium 134 (L) 135 - 145 mmol/L   Potassium 4.0 3.5 - 5.1 mmol/L   Chloride 103 98 - 111 mmol/L   CO2 17 (L) 22 - 32 mmol/L   Glucose, Bld 104 (H) 70 - 99 mg/dL   BUN 10 6 - 20 mg/dL   Creatinine, Ser 1.30 0.44 - 1.00 mg/dL   Calcium 9.0 8.9 - 86.5 mg/dL   Total Protein 5.8 (L) 6.5 - 8.1 g/dL   Albumin 2.3 (L) 3.5 - 5.0 g/dL   AST 21 15 - 41 U/L   ALT 17 0 - 44 U/L   Alkaline Phosphatase 136 (H) 38 - 126 U/L   Total Bilirubin 0.3 0.3 - 1.2 mg/dL   GFR, Estimated >78 >46 mL/min   Anion gap 14 5 - 15  Brain natriuretic peptide     Status: None   Collection Time: 04/11/23  2:02 AM  Result Value Ref Range   B Natriuretic Peptide 20.0 0.0 - 100.0 pg/mL  Troponin I (High Sensitivity)     Status: None   Collection Time: 04/11/23  2:02 AM  Result Value Ref Range   Troponin I (High Sensitivity) 6 <18 ng/L    O/Positive/-- (03/21 1434)  Imaging:  DG CHEST PORT 1 VIEW  Result Date: 04/11/2023 CLINICAL DATA:  Chest pain, shortness of breath. EXAM: PORTABLE CHEST 1 VIEW COMPARISON:  06/26/2021. FINDINGS: The heart is enlarged and mediastinal  contours are within normal limits. No consolidation, effusion, or pneumothorax. No acute osseous abnormality. IMPRESSION: No active disease. Electronically Signed   By: Thornell Sartorius M.D.   On: 04/11/2023 01:58      MAU Course/MDM: I have reviewed the triage vital signs and the nursing notes.   Pertinent labs & imaging results that were available during my care of the patient were reviewed by me and considered in my medical decision making (see chart for details).      I have reviewed her medical records including past results, notes and treatments.   I have ordered labs and reviewed results. Troponin and BNP normal    Chest xray negative. Maalox helped chest feel better.   Oxygen sats 98-100% throughout  NST reviewed Consult Dr Vergie Living with presentation, exam findings and test results.  Treatments in MAU included Maalox, EFM Patient was able to rest and was much improved over time.   Discussed SOB was likely related to fundus under diaphragm. .    Assessment: Single IUP at [redacted]w[redacted]d Shortness of breath due to advancing fundal height. Reassuring fetal heart rate pattern.   Plan: Discharge home Labor precautions and fetal kick counts Encouraged to return if she develops worsening of symptoms, increase in pain, fever, or other concerning symptoms.   Follow up in Office for prenatal visits and recheck   Pt stable at time of discharge.  Wynelle Bourgeois CNM,  MSN Certified Nurse-Midwife 04/11/2023 1:14 AM

## 2023-04-11 NOTE — Progress Notes (Signed)
Patient ID: Tracy [name], female   DOB: Mar 30, 1986, 37 y.o.   MRN: [id number]  Bedside US done. Baby confirmed frank breech. I had a long conversation with her and her partner regarding baby's position and mode of delivery. As patient has already SROM, unable to do ECV. We discussed cesarean delivery - pros and cons. Also discussed possibility of breech vaginal delivery. We discussed possibility of head entrapment, poor transition, etc. Patient would like to proceed with breech vaginal delivery. We discussed that there is a possibility that breech position may not put correct pressure on cervix and that we may need to proceed with cesarean delivery anyway.   Will continue to titrate pitocin.  Levie Heritage, DO

## 2023-04-11 NOTE — Anesthesia Preprocedure Evaluation (Signed)
Anesthesia Evaluation  Patient identified by MRN, date of birth, ID band Patient awake    Reviewed: Allergy & Precautions, NPO status , Patient's Chart, lab work & pertinent test results  History of Anesthesia Complications Negative for: history of anesthetic complications  Airway Mallampati: III  TM Distance: >3 FB Neck ROM: Full    Dental   Pulmonary former smoker   Pulmonary exam normal breath sounds clear to auscultation       Cardiovascular hypertension,  Rhythm:Regular Rate:Normal     Neuro/Psych  PSYCHIATRIC DISORDERS  Depression       GI/Hepatic negative GI ROS, Neg liver ROS,,,  Endo/Other  diabetes, Gestational, Oral Hypoglycemic Agents    Renal/GU negative Renal ROS     Musculoskeletal   Abdominal   Peds  Hematology  (+) Blood dyscrasia, anemia Lab Results      Component                Value               Date                      WBC                      9.2                 04/11/2023                HGB                      9.3 (L)             04/11/2023                HCT                      30.7 (L)            04/11/2023                MCV                      76.6 (L)            04/11/2023                PLT                      303                 04/11/2023              Anesthesia Other Findings   Reproductive/Obstetrics (+) Pregnancy                             Anesthesia Physical Anesthesia Plan  ASA: 2  Anesthesia Plan: Epidural   Post-op Pain Management:    Induction:   PONV Risk Score and Plan:   Airway Management Planned:   Additional Equipment:   Intra-op Plan:   Post-operative Plan:   Informed Consent: I have reviewed the patients History and Physical, chart, labs and discussed the procedure including the risks, benefits and alternatives for the proposed anesthesia with the patient or authorized representative who has indicated his/her  understanding and acceptance.       Plan Discussed with: Anesthesiologist  Anesthesia Plan Comments: (I  have discussed risks of neuraxial anesthesia including but not limited to infection, bleeding, nerve injury, back pain, headache, seizures, and failure of block. Patient denies bleeding disorders and is not currently anticoagulated. Labs have been reviewed. Risks and benefits discussed. All patient's questions answered.  )       Anesthesia Quick Evaluation

## 2023-04-11 NOTE — Discharge Summary (Signed)
Postpartum Discharge Summary   Patient Name: Tracy [name] DOB: Jan 02, 1986 MRN: [id number]  Date of admission: 04/11/2023 Delivery date:04/11/2023 Delivering provider: Levie Heritage Date of discharge: 04/13/2023  Admitting diagnosis: Gestational diabetes mellitus (GDM) controlled on oral hypoglycemic drug [O24.415] Intrauterine pregnancy: [redacted]w[redacted]d     Secondary diagnosis:  Principal Problem:   SVD (spontaneous vaginal delivery) Active Problems:   LGA (large for gestational age) fetus affecting management of mother   Chronic hypertension affecting pregnancy   Obesity affecting pregnancy in second trimester   Supervision of high risk pregnancy, antepartum, second trimester   AMA (advanced maternal age) multigravida 35+   Symptomatic anemia   Gestational diabetes mellitus (GDM) controlled on oral hypoglycemic drug   Breech birth  Additional problems: None    Discharge diagnosis: Preterm Pregnancy Delivered                                              Post partum procedures: BTL Augmentation: Pitocin Complications: None  Hospital course: Onset of Labor With Vaginal Delivery      37 y.o. yo [id number] at [redacted]w[redacted]d was admitted in Latent Labor after SROM on 04/11/2023. Labor course was complicated by none. Membrane Rupture Time/Date: 5:00 AM,04/11/2023  Delivery Method:Vaginal, Breech Operative Delivery:N/A Episiotomy: None Lacerations:  None Patient had a postpartum course complicated by symptomatic anemia. Venofer given.  She is ambulating, tolerating a regular diet, passing flatus, and urinating well. Patient is discharged home in stable condition on 04/13/23.  Newborn Data: Birth date:04/11/2023 Birth time:9:18 PM Gender:Female Living status:Living Apgars:7 ,8  Weight:3544 g  Magnesium Sulfate received: No BMZ received: No Rhophylac:No MMR:No T-DaP:Given prenatally Flu: N/A Transfusion:No  Physical exam  Vitals:   04/12/23 1838 04/12/23 1955 04/12/23 2114  04/13/23 0533  BP: 127/80 139/88 (!) 138/92 132/89  Pulse: 79 94 81 85  Resp:  18 18 18   Temp: 98.2 F (36.8 C) 98.6 F (37 C) 98.6 F (37 C) 98 F (36.7 C)  TempSrc:  Oral Oral Oral  SpO2: 98%  100%    General: alert, cooperative, and no distress Lochia: appropriate Uterine Fundus: firm Incision: Dressing is clean, dry, and intact DVT Evaluation: No evidence of DVT seen on physical exam. Labs: Lab Results  Component Value Date   WBC 9.2 04/11/2023   HGB 9.3 (L) 04/11/2023   HCT 30.7 (L) 04/11/2023   MCV 76.6 (L) 04/11/2023   PLT 303 04/11/2023      Latest Ref Rng & Units 04/11/2023    2:02 AM  CMP  Glucose 70 - 99 mg/dL 440   BUN 6 - 20 mg/dL 10   Creatinine 1.02 - 1.00 mg/dL 7.25   Sodium 366 - 440 mmol/L 134   Potassium 3.5 - 5.1 mmol/L 4.0   Chloride 98 - 111 mmol/L 103   CO2 22 - 32 mmol/L 17   Calcium 8.9 - 10.3 mg/dL 9.0   Total Protein 6.5 - 8.1 g/dL 5.8   Total Bilirubin 0.3 - 1.2 mg/dL 0.3   Alkaline Phos 38 - 126 U/L 136   AST 15 - 41 U/L 21   ALT 0 - 44 U/L 17    Edinburgh Score:    03/03/2020    8:33 AM  Edinburgh Postnatal Depression Scale Screening Tool  I have been able to laugh and see the funny side of things. 0  I  have looked forward with enjoyment to things. 0  I have blamed myself unnecessarily when things went wrong. 0  I have been anxious or worried for no good reason. 0  I have felt scared or panicky for no good reason. 0  Things have been getting on top of me. 0  I have been so unhappy that I have had difficulty sleeping. 0  I have felt sad or miserable. 0  I have been so unhappy that I have been crying. 0  The thought of harming myself has occurred to me. 0  Edinburgh Postnatal Depression Scale Total 0     After visit meds:  Allergies as of 04/13/2023   No Known Allergies      Medication List     STOP taking these medications    Accu-Chek Guide test strip Generic drug: glucose blood   Accu-Chek Guide w/Device Kit    Accu-Chek Softclix Lancets lancets   aspirin EC 81 MG tablet   Blood Pressure Kit Devi   cyclobenzaprine 5 MG tablet Commonly known as: FLEXERIL   metFORMIN 1000 MG tablet Commonly known as: GLUCOPHAGE   miconazole 2 % vaginal cream Commonly known as: MONISTAT 7   PrePLUS 27-1 MG Tabs       TAKE these medications    acetaminophen 500 MG tablet Commonly known as: TYLENOL Take 2 tablets (1,000 mg total) by mouth every 8 (eight) hours as needed for moderate pain (for pain scale < 4).   ferrous sulfate 325 (65 FE) MG EC tablet Take 1 tablet (325 mg total) by mouth every other day.   furosemide 40 MG tablet Commonly known as: LASIX Take 1 tablet (40 mg total) by mouth daily for 3 days.   ibuprofen 800 MG tablet Commonly known as: ADVIL Take 1 tablet (800 mg total) by mouth every 8 (eight) hours as needed for moderate pain.   oxyCODONE 5 MG immediate release tablet Commonly known as: Oxy IR/ROXICODONE Take 1 tablet (5 mg total) by mouth every 6 (six) hours as needed for up to 5 days for severe pain.   senna-docusate 8.6-50 MG tablet Commonly known as: Senokot-S Take 2 tablets by mouth at bedtime as needed for mild constipation.   Vitafol Gummies 3.33-0.333-34.8 MG Chew Chew 3 tablets by mouth daily in the afternoon.         Discharge home in stable condition Infant Feeding: Bottle Infant Disposition:home with mother Discharge instruction: per After Visit Summary and Postpartum booklet. Activity: Advance as tolerated. Pelvic rest for 6 weeks.  Diet: routine diet Future Appointments: Future Appointments  Date Time Provider Department Center  04/19/2023  3:40 PM [facility] NURSE [facility] None  05/23/2023  9:00 AM [facility] LAB [facility] None  05/23/2023 10:55 AM Constant, Gigi Gin, MD [facility] None   Follow up Visit:  Message sent to [facility] 9/11  Please schedule this patient for a In person postpartum visit in 6 weeks with the following provider: Any  provider. Additional Postpartum F/U:2 hour GTT and BP check 1 week  High risk pregnancy complicated by: GDM and HTN Delivery mode:  Vaginal, Breech Anticipated Birth Control:  BTL done [facility]   04/13/2023 Joanne Gavel, MD OB Fellow

## 2023-04-11 NOTE — H&P (Cosign Needed Addendum)
OBSTETRIC ADMISSION HISTORY AND PHYSICAL  Tracy [name] is a 37 y.o. female [phone] with IUP at [redacted]w[redacted]d by LMP presenting for SROM. She reports +FMs, +LOF, no VB, no blurry vision, headaches or peripheral edema, and RUQ pain.  She plans on breast/bottle feeding. She request BTL for birth control. She received her prenatal care at  [facility]    Dating: By LMP equal to [redacted]w[redacted]d [place] --->  Estimated Date of Delivery: 05/05/23  Sono:   @[redacted]w[redacted]d , CWD, normal anatomy, breech presentation, transverse lie, 3251g, >99% EFW   Prenatal History/Complications:   gHTN A2GDM AMA Obesity LGA infant Unstable fetal lie   Past Medical History: Past Medical History:  Diagnosis Date   Chronic cystitis 12/15/2021   Contraception management 06/16/2021   Depression    Diabetes mellitus without complication (HCC)    diet controlled   Encounter for Nexplanon removal 06/16/2021   Gestational diabetes    Pelvic pain 12/15/2021   Teeth problem    cracked molars, pt unsure if just right side or both sides    Past Surgical History: Past Surgical History:  Procedure Laterality Date   NO PAST SURGERIES      Obstetrical History: OB History     Gravida  7   Para  3   Term  3   Preterm  0   AB  3   Living  3      SAB  3   IAB  0   Ectopic  0   Multiple  0   Live Births  3           Social History Social History   Socioeconomic History   Marital status: Single    Spouse name: Not on file   Number of children: Not on file   Years of education: Not on file   Highest education level: Not on file  Occupational History   Not on file  Tobacco Use   Smoking status: Former    Types: Cigarettes   Smokeless tobacco: Never  Vaping Use   Vaping status: Never Used  Substance and Sexual Activity   Alcohol use: No   Drug use: No   Sexual activity: Yes    Partners: Male    Birth control/protection: None  Other Topics Concern   Not on file  Social History Narrative   Not on  file   Social Determinants of Health   Financial Resource Strain: Not on file  Food Insecurity: No Food Insecurity (04/11/2023)   Hunger Vital Sign    Worried About Running Out of Food in the Last Year: Never true    Ran Out of Food in the Last Year: Never true  Transportation Needs: No Transportation Needs (04/11/2023)   PRAPARE - Administrator, Civil Service (Medical): No    Lack of Transportation (Non-Medical): No  Physical Activity: Not on file  Stress: Not on file  Social Connections: Not on file    Family History: Family History  Problem Relation Age of Onset   Diabetes Mother    Diabetes Father     Allergies: No Known Allergies  Medications Prior to Admission  Medication Sig Dispense Refill Last Dose   Accu-Chek Softclix Lancets lancets 100 each by Other route 4 (four) times daily. 100 each 12    aspirin EC 81 MG tablet Take 1 tablet (81 mg total) by mouth daily. Take after 12 weeks for prevention of preeclampsia later in pregnancy 300 tablet 2    Blood  Glucose Monitoring Suppl (ACCU-CHEK GUIDE) w/Device KIT 1 kit by Does not apply route daily. 1 kit 0    Blood Pressure Monitoring (BLOOD PRESSURE KIT) DEVI 1 kit by Does not apply route once a week. 1 each 0    cyclobenzaprine (FLEXERIL) 5 MG tablet Take 1 tablet (5 mg total) by mouth every 8 (eight) hours as needed for muscle spasms. (Patient not taking: Reported on 01/22/2023) 30 tablet 0    ferrous sulfate 325 (65 FE) MG EC tablet Take 1 tablet (325 mg total) by mouth every other day. 30 tablet 0    glucose blood (ACCU-CHEK GUIDE) test strip Use as instructed 100 each 12    metFORMIN (GLUCOPHAGE) 1000 MG tablet Take 1 tablet (1,000 mg total) by mouth 2 (two) times daily with a meal. 60 tablet 3    miconazole (MONISTAT 7) 2 % vaginal cream Place 1 Applicatorful vaginally at bedtime. Apply for seven nights 30 g 2    Prenatal Vit-Fe Fumarate-FA (PREPLUS) 27-1 MG TABS Take 1 tablet by mouth daily. (Patient not  taking: Reported on 04/10/2023) 30 tablet 13    Prenatal Vit-Fe Phos-FA-Omega (VITAFOL GUMMIES) 3.33-0.333-34.8 MG CHEW Chew 3 tablets by mouth daily in the afternoon. 90 tablet 11      Review of Systems   All systems reviewed and negative except as stated in HPI  Blood pressure 106/62, pulse 91, temperature 97.7 F (36.5 C), resp. rate 16, last menstrual period 07/29/2022, SpO2 94%, unknown if currently breastfeeding. General appearance: alert, cooperative, and appears stated age Lungs: clear to auscultation bilaterally Heart: regular rate and rhythm Abdomen: soft, non-tender; bowel sounds normal Pelvic: 1/60/high Extremities: Homans sign is negative, no sign of DVT DTR's intact Presentation: breech Fetal monitoringBaseline: 120 bpm, Variability: Good {> 6 bpm), Accelerations: Reactive, and Decelerations: Absent Uterine activity Frequency: Irregular  Dilation: 1 Effacement (%): 60 Station: Ballotable Exam by:: Quintella Baton RNC   Prenatal labs: ABO, Rh: --/--/O POS (09/11 0624) Antibody: NEG (09/11 0624) Rubella: 1.29 (03/21 1434) RPR: NON REACTIVE (09/11 0625)  HBsAg: Negative (03/21 1434)  HIV: Non Reactive (07/26 1155)  GBS: NEGATIVE/-- (09/11 0744)  2 hr Glucola failed Genetic screening  NIPS: LR Female, AFP: negative, Horizon: negative Anatomy US normal  Prenatal Transfer Tool  Maternal Diabetes: Yes:  Diabetes Type:  Insulin/Medication controlled Genetic Screening: Normal Maternal Ultrasounds/Referrals: LGA Fetal Ultrasounds or other Referrals:  Referred to Materal Fetal Medicine  Maternal Substance Abuse:  No Significant Maternal Medications:  Meds include: Other: Metformin Significant Maternal Lab Results:  Group B Strep negative Number of Prenatal Visits:greater than 3 verified prenatal visits Other Comments:  None  Results for orders placed or performed during the hospital encounter of 04/11/23 (from the past 24 hour(s))  POCT fern test   Collection Time:  04/11/23  6:10 AM  Result Value Ref Range   POCT Fern Test    Type and screen   Collection Time: 04/11/23  6:24 AM  Result Value Ref Range   ABO/RH(D) O POS    Antibody Screen NEG    Sample Expiration      04/14/2023,2359 Performed at [facility], [address], [place], [place] [address]   CBC   Collection Time: 04/11/23  6:25 AM  Result Value Ref Range   WBC 9.2 4.0 - 10.5 K/uL   RBC 4.01 3.87 - 5.11 MIL/uL   Hemoglobin 9.3 (L) 12.0 - 15.0 g/dL   HCT 26.9 (L) 48.5 - 46.2 %   MCV 76.6 (  L) 80.0 - 100.0 fL   MCH 23.2 (L) 26.0 - 34.0 pg   MCHC 30.3 30.0 - 36.0 g/dL   RDW 42.5 95.6 - 38.7 %   Platelets 303 150 - 400 K/uL   nRBC 0.0 0.0 - 0.2 %  RPR   Collection Time: 04/11/23  6:25 AM  Result Value Ref Range   RPR Ser Ql NON REACTIVE NON REACTIVE  Fern Test   Collection Time: 04/11/23  6:26 AM  Result Value Ref Range   POCT Fern Test Positive = ruptured amniotic membanes   Group B strep by PCR   Collection Time: 04/11/23  7:44 AM   Specimen: Vaginal/Rectal; Genital  Result Value Ref Range   Group B strep by PCR NEGATIVE NEGATIVE  Glucose, capillary   Collection Time: 04/11/23  8:34 AM  Result Value Ref Range   Glucose-Capillary 114 (H) 70 - 99 mg/dL  Results for orders placed or performed during the hospital encounter of 04/11/23 (from the past 24 hour(s))  Urinalysis, Routine w reflex microscopic -Urine, Clean Catch   Collection Time: 04/11/23  1:55 AM  Result Value Ref Range   Color, Urine YELLOW YELLOW   APPearance CLEAR CLEAR   Specific Gravity, Urine 1.006 1.005 - 1.030   pH 7.0 5.0 - 8.0   Glucose, UA 50 (A) NEGATIVE mg/dL   Hgb urine dipstick NEGATIVE NEGATIVE   Bilirubin Urine NEGATIVE NEGATIVE   Ketones, ur NEGATIVE NEGATIVE mg/dL   Protein, ur NEGATIVE NEGATIVE mg/dL   Nitrite NEGATIVE NEGATIVE   Leukocytes,Ua TRACE (A) NEGATIVE   RBC / HPF 0-5 0 - 5 RBC/hpf   WBC, UA 0-5 0 - 5 WBC/hpf   Bacteria, UA RARE (A) NONE SEEN   Squamous Epithelial /  HPF 6-10 0 - 5 /HPF  CBC   Collection Time: 04/11/23  2:02 AM  Result Value Ref Range   WBC 8.7 4.0 - 10.5 K/uL   RBC 3.79 (L) 3.87 - 5.11 MIL/uL   Hemoglobin 9.0 (L) 12.0 - 15.0 g/dL   HCT 56.4 (L) 33.2 - 95.1 %   MCV 78.4 (L) 80.0 - 100.0 fL   MCH 23.7 (L) 26.0 - 34.0 pg   MCHC 30.3 30.0 - 36.0 g/dL   RDW 88.4 16.6 - 06.3 %   Platelets 315 150 - 400 K/uL   nRBC 0.0 0.0 - 0.2 %  Comprehensive metabolic panel   Collection Time: 04/11/23  2:02 AM  Result Value Ref Range   Sodium 134 (L) 135 - 145 mmol/L   Potassium 4.0 3.5 - 5.1 mmol/L   Chloride 103 98 - 111 mmol/L   CO2 17 (L) 22 - 32 mmol/L   Glucose, Bld 104 (H) 70 - 99 mg/dL   BUN 10 6 - 20 mg/dL   Creatinine, Ser 0.16 0.44 - 1.00 mg/dL   Calcium 9.0 8.9 - 01.0 mg/dL   Total Protein 5.8 (L) 6.5 - 8.1 g/dL   Albumin 2.3 (L) 3.5 - 5.0 g/dL   AST 21 15 - 41 U/L   ALT 17 0 - 44 U/L   Alkaline Phosphatase 136 (H) 38 - 126 U/L   Total Bilirubin 0.3 0.3 - 1.2 mg/dL   GFR, Estimated >93 >23 mL/min   Anion gap 14 5 - 15  Brain natriuretic peptide   Collection Time: 04/11/23  2:02 AM  Result Value Ref Range   B Natriuretic Peptide 20.0 0.0 - 100.0 pg/mL  Troponin I (High Sensitivity)   Collection Time: 04/11/23  2:02 AM  Result Value Ref Range   Troponin I (High Sensitivity) 6 <18 ng/L    Patient Active Problem List   Diagnosis Date Noted   Gestational diabetes mellitus (GDM) controlled on oral hypoglycemic drug 04/11/2023   Symptomatic anemia 04/10/2023   Supervision of high risk pregnancy, antepartum, second trimester 12/08/2022   AMA (advanced maternal age) multigravida 35+ 12/08/2022   Chronic hypertension affecting pregnancy 11/16/2022   Obesity affecting pregnancy in second trimester 11/16/2022   LGA (large for gestational age) fetus affecting management of mother 10/15/2019   History of gestational diabetes 06/23/2016   Gestational diabetes-Metformin 06/20/2016    Assessment/Plan:  Lujain Arguello-[name] is  a 37 y.o. [id number] at [redacted]w[redacted]d here for SROM.   #cHTN - not medication controlled. On ASA. Has had some severe range pressures in last 24 hours but last pressure 125/80. Will continue to monitor #A2GDM - on metformin. Glucose checks per protocol #LGA #Polyhydramnios  #Labor: Baby is currently transverse. Will start pitocin 2x2 and monitor fetal position.  #Pain: Epidural #FWB: Moderate variability, accels present, decels absent, Cat I tracing #ID:  GBS negative #MOF: Breast/Bottle #MOC:BTL (Consent signed 7/26) #Circ:  N/A, Female  Moody Bruins, MD  04/11/2023, 10:27 AM  GME ATTESTATION:  Evaluation and management procedures were performed by the [facility] Medicine Resident under my supervision. I was immediately available for direct supervision, assistance and direction throughout this encounter.  I also confirm that I have verified the information documented in the resident's note, and that I have also personally reperformed the pertinent components of the physical exam and all of the medical decision making activities.  I have also made any necessary editorial changes.  Wyn Forster, MD OB Fellow, Faculty Practice [facility] 04/11/2023 2:29 PM

## 2023-04-11 NOTE — Progress Notes (Signed)
WRitten and verbal d/c instructions given by Joline Salt RN and pt voiced understanding

## 2023-04-11 NOTE — Anesthesia Procedure Notes (Signed)
Epidural Patient location during procedure: OB Start time: 04/11/2023 8:45 AM End time: 04/11/2023 8:50 AM  Staffing Anesthesiologist: Linton Rump, MD Performed: anesthesiologist   Preanesthetic Checklist Completed: patient identified, IV checked, site marked, risks and benefits discussed, surgical consent, monitors and equipment checked, pre-op evaluation and timeout performed  Epidural Patient position: sitting Prep: DuraPrep and site prepped and draped Patient monitoring: continuous pulse ox and blood pressure Approach: midline Location: L3-L4 Injection technique: LOR saline  Needle:  Needle type: Tuohy  Needle gauge: 17 G Needle length: 9 cm and 9 Needle insertion depth: 5 cm Catheter type: closed end flexible Catheter size: 19 Gauge Catheter at skin depth: 9 cm Test dose: negative  Assessment Events: blood not aspirated, no cerebrospinal fluid, injection not painful, no injection resistance, no paresthesia and negative IV test  Additional Notes The patient has requested an epidural for labor pain management. Risks and benefits including, but not limited to, infection, bleeding, local anesthetic toxicity, headache, hypotension, back pain, block failure, etc. were discussed with the patient. The patient expressed understanding and consented to the procedure. I confirmed that the patient has no bleeding disorders and is not taking blood thinners. I confirmed the patient's last platelet count with the nurse. A time-out was performed immediately prior to the procedure. Please see nursing documentation for vital signs. Sterile technique was used throughout the whole procedure. Once LOR achieved, the epidural catheter threaded easily without resistance. Aspiration of the catheter was negative for blood and CSF. The epidural was dosed slowly and an infusion was started.  1 attempt(s)Reason for block:procedure for pain

## 2023-04-12 ENCOUNTER — Inpatient Hospital Stay (HOSPITAL_COMMUNITY): Payer: Medicaid Other | Admitting: Anesthesiology

## 2023-04-12 ENCOUNTER — Encounter (HOSPITAL_COMMUNITY)
Admission: AD | Disposition: A | Discharge status: Home / Self Care | Payer: Self-pay | Source: Home / Self Care | Attending: Obstetrics and Gynecology

## 2023-04-12 ENCOUNTER — Other Ambulatory Visit: Payer: Self-pay

## 2023-04-12 DIAGNOSIS — Z302 Encounter for sterilization: Secondary | ICD-10-CM | POA: Diagnosis not present

## 2023-04-12 HISTORY — PX: TUBAL LIGATION: SHX77

## 2023-04-12 LAB — CERVICOVAGINAL ANCILLARY ONLY
Chlamydia: NEGATIVE
Comment: NEGATIVE
Comment: NEGATIVE
Comment: NORMAL
Neisseria Gonorrhea: NEGATIVE
Trichomonas: NEGATIVE

## 2023-04-12 LAB — GLUCOSE, CAPILLARY
Glucose-Capillary: 71 mg/dL (ref 70–99)
Glucose-Capillary: 55 mg/dL — ABNORMAL LOW (ref 70–99)
Glucose-Capillary: 86 mg/dL (ref 70–99)

## 2023-04-12 SURGERY — LIGATION, FALLOPIAN TUBE, POSTPARTUM
Anesthesia: Epidural | Laterality: Bilateral

## 2023-04-12 MED ORDER — LIDOCAINE HCL 1 % IJ SOLN
INTRAMUSCULAR | Status: AC
  Filled 2023-04-12: qty 20

## 2023-04-12 MED ORDER — FENTANYL CITRATE (PF) 100 MCG/2ML IJ SOLN
INTRAMUSCULAR | Status: DC | PRN
Start: 2023-04-12 — End: 2023-04-12
  Administered 2023-04-12 (×2): 50 ug via INTRAVENOUS

## 2023-04-12 MED ORDER — ACETAMINOPHEN 10 MG/ML IV SOLN
INTRAVENOUS | Status: DC | PRN
Start: 2023-04-12 — End: 2023-04-12
  Administered 2023-04-12: 1000 mg via INTRAVENOUS

## 2023-04-12 MED ORDER — FENTANYL CITRATE (PF) 100 MCG/2ML IJ SOLN
INTRAMUSCULAR | Status: AC
  Filled 2023-04-12: qty 2

## 2023-04-12 MED ORDER — OXYCODONE HCL 5 MG PO TABS
5.0000 mg | ORAL_TABLET | ORAL | Status: DC | PRN
  Administered 2023-04-12 – 2023-04-13 (×5): 5 mg via ORAL
  Filled 2023-04-12 (×5): qty 1

## 2023-04-12 MED ORDER — SODIUM BICARBONATE 8.4 % IV SOLN
INTRAVENOUS | Status: DC | PRN
  Administered 2023-04-12: 5 mL via EPIDURAL
  Administered 2023-04-12: 10 mL via EPIDURAL
  Administered 2023-04-12: 5 mL via EPIDURAL

## 2023-04-12 MED ORDER — FAMOTIDINE 20 MG PO TABS
40.0000 mg | ORAL_TABLET | Freq: Once | ORAL | Status: AC
  Administered 2023-04-12: 40 mg via ORAL
  Filled 2023-04-12: qty 2

## 2023-04-12 MED ORDER — ONDANSETRON HCL 4 MG/2ML IJ SOLN
INTRAMUSCULAR | Status: DC | PRN
  Administered 2023-04-12: 4 mg via INTRAVENOUS

## 2023-04-12 MED ORDER — DEXMEDETOMIDINE HCL IN NACL 80 MCG/20ML IV SOLN
INTRAVENOUS | Status: DC | PRN
  Administered 2023-04-12: 8 ug via INTRAVENOUS
  Administered 2023-04-12: 4 ug via INTRAVENOUS

## 2023-04-12 MED ORDER — FENTANYL CITRATE (PF) 100 MCG/2ML IJ SOLN: 25.0000 ug | INTRAMUSCULAR | Status: DC | PRN

## 2023-04-12 MED ORDER — KETOROLAC TROMETHAMINE 30 MG/ML IJ SOLN
INTRAMUSCULAR | Status: DC | PRN
  Administered 2023-04-12: 30 mg via INTRAVENOUS

## 2023-04-12 MED ORDER — FENTANYL CITRATE (PF) 100 MCG/2ML IJ SOLN
INTRAMUSCULAR | Status: DC | PRN
  Administered 2023-04-12: 100 ug via EPIDURAL

## 2023-04-12 MED ORDER — SODIUM CHLORIDE 0.9 % IR SOLN
Status: DC | PRN
  Administered 2023-04-12: 1

## 2023-04-12 MED ORDER — METOCLOPRAMIDE HCL 10 MG PO TABS
10.0000 mg | ORAL_TABLET | Freq: Once | ORAL | Status: AC
  Administered 2023-04-12: 10 mg via ORAL
  Filled 2023-04-12: qty 1

## 2023-04-12 MED ORDER — LACTATED RINGERS IV SOLN: INTRAVENOUS | Status: DC

## 2023-04-12 MED ORDER — LIDOCAINE-EPINEPHRINE (PF) 2 %-1:200000 IJ SOLN
INTRAMUSCULAR | Status: AC
  Filled 2023-04-12: qty 40

## 2023-04-12 MED ORDER — STERILE WATER FOR IRRIGATION IR SOLN
Status: DC | PRN
  Administered 2023-04-12: 1000 mL

## 2023-04-12 MED ORDER — FERROUS SULFATE 325 (65 FE) MG PO TABS: 325.0000 mg | ORAL_TABLET | ORAL | Status: DC

## 2023-04-12 MED ORDER — BUPIVACAINE HCL (PF) 0.25 % IJ SOLN
INTRAMUSCULAR | Status: DC | PRN
  Administered 2023-04-12: 20 mL

## 2023-04-12 MED ORDER — OXYCODONE HCL 5 MG PO TABS
5.0000 mg | ORAL_TABLET | Freq: Four times a day (QID) | ORAL | Status: DC | PRN
  Administered 2023-04-12: 5 mg via ORAL
  Filled 2023-04-12: qty 1

## 2023-04-12 MED ORDER — DEXMEDETOMIDINE HCL IN NACL 80 MCG/20ML IV SOLN
INTRAVENOUS | Status: AC
  Filled 2023-04-12: qty 20

## 2023-04-12 SURGICAL SUPPLY — 22 items
ADH SKN CLS APL DERMABOND .7 (GAUZE/BANDAGES/DRESSINGS) ×1
BLADE SURG 11 STRL SS (BLADE) ×1 IMPLANT
CLOTH BEACON ORANGE TIMEOUT ST (SAFETY) ×1 IMPLANT
DERMABOND ADVANCED .7 DNX12 (GAUZE/BANDAGES/DRESSINGS) IMPLANT
DRSG OPSITE POSTOP 3X4 (GAUZE/BANDAGES/DRESSINGS) ×1 IMPLANT
DURAPREP 26ML APPLICATOR (WOUND CARE) ×1 IMPLANT
GLOVE BIOGEL PI IND STRL 7.0 (GLOVE) ×3 IMPLANT
GLOVE ECLIPSE 7.0 STRL STRAW (GLOVE) ×1 IMPLANT
GOWN STRL REUS W/TWL LRG LVL3 (GOWN DISPOSABLE) ×2 IMPLANT
LIGASURE IMPACT 36 18CM CVD LR (INSTRUMENTS) IMPLANT
NEEDLE HYPO 22GX1.5 SAFETY (NEEDLE) ×1 IMPLANT
NS IRRIG 1000ML POUR BTL (IV SOLUTION) ×1 IMPLANT
PACK ABDOMINAL MINOR (CUSTOM PROCEDURE TRAY) ×1 IMPLANT
PROTECTOR NERVE ULNAR (MISCELLANEOUS) ×1 IMPLANT
SPONGE LAP 4X18 RFD (DISPOSABLE) IMPLANT
SUT VIC AB 4-0 PS2 27 (SUTURE) IMPLANT
SUT VICRYL 0 UR6 27IN ABS (SUTURE) ×1 IMPLANT
SUT VICRYL 4-0 PS2 18IN ABS (SUTURE) ×1 IMPLANT
SYR CONTROL 10ML LL (SYRINGE) ×1 IMPLANT
TOWEL OR 17X24 6PK STRL BLUE (TOWEL DISPOSABLE) ×2 IMPLANT
TRAY FOLEY CATH SILVER 14FR (SET/KITS/TRAYS/PACK) ×1 IMPLANT
WATER STERILE IRR 1000ML POUR (IV SOLUTION) ×1 IMPLANT

## 2023-04-12 NOTE — Progress Notes (Signed)
Post Partum Day 1 Subjective:  Tracy [name] is a 37 y.o. [id number] [redacted]w[redacted]d s/p SVD, Breech vaginal delivery.  No acute events overnight.  She is reporting dizziness and labia pain. She reports cramping that is painful. Pt denies problems with ambulating, voiding or po intake.  She denies nausea or vomiting.  Pain is moderately controlled.  She has had flatus.  Lochia Moderate.  Plan for birth control is bilateral tubal ligation.  Method of Feeding: both  Objective: Blood pressure (!) 132/94, pulse 86, temperature 97.7 F (36.5 C), temperature source Oral, resp. rate 18, last menstrual period 07/29/2022, SpO2 98%, unknown if currently breastfeeding.  Physical Exam:  General: alert, cooperative and no distress Lochia:normal flow Chest: normal WOB Heart: Regular rate Abdomen: +BS, soft, mild TTP (appropriate) Uterine Fundus: firm, below umbilicus DVT Evaluation: No evidence of DVT seen on physical exam. Extremities: minimal edema GU: No labial hematoma and bilateral labia are soft and compressible  Recent Labs    04/11/23 0202 04/11/23 0625  HGB 9.0* 9.3*  HCT 29.7* 30.7*    Assessment/Plan:  ASSESSMENT: Tracy [name] is a 37 y.o. [id number] [redacted]w[redacted]d s/p vaginal breech delivery  Plan for discharge tomorrow and Breastfeeding Continue routine PP care Breastfeeding support PRN  #cHTN: On lasix  #A1GDM: AM BG 71. Will need 2hr GTT postpartum  #BTS-- desires and was not posted today. She will be posted at 3:30/4PM and patient is willing to wait. Has been NPO  Postpartum tubal consent:  37 y.o. [id number]  with undesired fertility,status post vaginal delivery, expressed desire for permanent sterilization.  I reviewed with the patient her expressed plan for tubal sterilization.  We dicussed other reversible forms of contraception including LARC options with similar effectiveness of BTS. She expressed that she strongly desire permanent sterilization. She declines all other  modalities. We discussed method of sterilization with the preferred method being salpingectomy. Risks of procedure discussed with patient including but not limited to: risk of regret, permanence of method, bleeding, infection, injury to surrounding organs and need for additional procedures.  Failure risk of 0.5-1% with increased risk of ectopic gestation if pregnancy occurs was also discussed with patient.    Federico Flake, MD    LOS: 1 day   Isa Rankin [facility] 04/12/2023, 9:45 AM

## 2023-04-12 NOTE — Lactation Note (Signed)
This note was copied from a baby's chart. Lactation Consultation Note  Patient Name: Girl Davon Arguello-[name] [id number] Date: 04/12/2023 Age:37 hours Reason for consult: Initial assessment;Maternal endocrine disorder  P3, [redacted]w[redacted]d.  Mother states she feels lightheaded and does not want to breastfeed at this time.  Baby is formula feeding.  Mother states baby consumed 30 ml with last feeding. Mother being prepped for BTL.  Suggest calling for help with latching as desired.    Maternal Data Has patient been taught Hand Expression?:  (Not yet) Does the patient have breastfeeding experience prior to this delivery?: Yes How long did the patient breastfeed?: 3 weeks  Feeding Mother's Current Feeding Choice: Breast Milk and Formula Nipple Type: Slow - flow  Interventions Interventions: LC Services brochure  Consult Status Consult Status: PRN    Hardie Pulley 04/12/2023, 1:34 PM

## 2023-04-12 NOTE — Transfer of Care (Signed)
Immediate Anesthesia Transfer of Care Note  Patient: Tracy [name]  Procedure(s) Performed: POST PARTUM TUBAL LIGATION (Bilateral)  Patient Location: PACU  Anesthesia Type:Epidural  Level of Consciousness: awake, alert , and oriented  Airway & Oxygen Therapy: Patient Spontanous Breathing  Post-op Assessment: Report given to RN and Post -op Vital signs reviewed and stable  Post vital signs: Reviewed and stable  Last Vitals:  Vitals Value Taken Time  BP 109/75   Temp    Pulse 77   Resp 13   SpO2 95     Last Pain:  Vitals:   04/12/23 1224  TempSrc: Oral  PainSc:       Patients Stated Pain Goal: 2 (04/11/23 0835)  Complications: No notable events documented.

## 2023-04-12 NOTE — Progress Notes (Signed)
[name] was referred for history of depression.  * Referral screened out by Clinical Social Worker because none of the following criteria appear to apply:  ~ History of depression during this pregnancy, or of post-partum depression following prior delivery.  ~ Diagnosis of depression within last 3 years  Per OB notes, [name] did not indicate any signs/symptoms during her pregnancy.  OR  * [name]'s symptoms currently being treated with medication and/or therapy.  Please contact the Clinical Social Worker if needs arise, by [facility] request, or if [name] scores greater than 9/yes to question 10 on Edinburgh Postpartum Depression Screen.  Tracy [name], Theresia Majors Clinical Social Worker [phone]

## 2023-04-12 NOTE — Anesthesia Postprocedure Evaluation (Signed)
Anesthesia Post Note  Patient: Tracy [name]  Procedure(s) Performed: AN AD HOC LABOR EPIDURAL     Anesthesia Type: Epidural Anesthetic complications: no   No notable events documented.  Last Vitals:  Vitals:   04/12/23 1800 04/12/23 1811  BP: 114/78   Pulse: 83 87  Resp: 12 14  Temp:    SpO2: 94% 96%    Last Pain:  Vitals:   04/12/23 1811  TempSrc:   PainSc: 0-No pain       No known anesthesia complications. Patient presented for BTL today.          Linton Rump

## 2023-04-12 NOTE — Op Note (Signed)
Tracy [name] 04/11/2023 - 04/12/2023  PREOPERATIVE DIAGNOSIS:  Undesired fertility  POSTOPERATIVE DIAGNOSIS:  Undesired fertility  PROCEDURE:  Postpartum Bilateral Tubal Sterilization using Ligasure   SURGEON: Federico Flake, MD  ANESTHESIA: epidural  COMPLICATIONS:  None immediate.  ESTIMATED BLOOD LOSS:  Less than 20cc.  FLUIDS: 1300 mL LR.  URINE OUTPUT:  300 mL of clear urine.  INDICATIONS: 37 y.o. yo [id number]  with undesired fertility,status post vaginal delivery, desires permanent sterilization. Risks and benefits of procedure discussed with patient including permanence of method, bleeding, infection, injury to surrounding organs and need for additional procedures. Risk failure of 0.5-1% with increased risk of ectopic gestation if pregnancy occurs was also discussed with patient.   FINDINGS:  Normal uterus, tubes, and ovaries.  TECHNIQUE:  The patient was taken to the operating room where her epidural anesthesia was dosed up to surgical level and found to be adequate.  She was then placed in the dorsal supine position and prepped and draped in sterile fashion.  After an adequate timeout was performed, attention was turned to the patient's abdomen where a small transverse skin incision was made under the umbilical fold. The incision was taken down to the layer of fascia using the scalpel, and fascia was incised, and extended bilaterally using Mayo scissors. The peritoneum was entered in a sharp fashion.   Attention was then turned to the patient's uterus, and left fallopian tube was identified and followed out to the fimbriated end.  Ligasure device was used to cauterize and cut the mesosalpinx to proximal end of the fallopian tube, removing ~6cm of tube. A similar process was carried out on the right side allowing for bilateral tubal sterilization.    Good hemostasis was noted overall.  The instruments were then removed from the patient's abdomen and the fascial  incision was repaired with 0 Vicryl, and the skin was closed with a 3-0 Monocryl subcuticular stitch. The patient tolerated the procedure well.  Sponge, lap, and needle counts were correct times two.  The patient was then taken to the recovery room awake, extubated and in stable condition.   Federico Flake, MD 04/12/2023 5:18 PM

## 2023-04-12 NOTE — Anesthesia Preprocedure Evaluation (Addendum)
Anesthesia Evaluation  Patient identified by MRN, date of birth, ID band Patient awake    Reviewed: Allergy & Precautions, NPO status , Patient's Chart, lab work & pertinent test results  Airway Mallampati: II  TM Distance: >3 FB Neck ROM: Full    Dental no notable dental hx.    Pulmonary former smoker   Pulmonary exam normal breath sounds clear to auscultation       Cardiovascular hypertension, Normal cardiovascular exam Rhythm:Regular Rate:Normal     Neuro/Psych  PSYCHIATRIC DISORDERS  Depression    negative neurological ROS     GI/Hepatic negative GI ROS, Neg liver ROS,,,  Endo/Other  diabetes, Well Controlled, Gestational    Renal/GU negative Renal ROS  negative genitourinary   Musculoskeletal negative musculoskeletal ROS (+)    Abdominal   Peds  Hematology  (+) Blood dyscrasia, anemia Lab Results      Component                Value               Date                      WBC                      9.2                 04/11/2023                HGB                      9.3 (L)             04/11/2023                HCT                      30.7 (L)            04/11/2023                MCV                      76.6 (L)            04/11/2023                PLT                      303                 04/11/2023              Anesthesia Other Findings PPTL  Reproductive/Obstetrics                             Anesthesia Physical Anesthesia Plan  ASA: 2  Anesthesia Plan: Epidural   Post-op Pain Management:    Induction:   PONV Risk Score and Plan: 2 and Treatment may vary due to age or medical condition, Midazolam and Ondansetron  Airway Management Planned: Natural Airway  Additional Equipment:   Intra-op Plan:   Post-operative Plan:   Informed Consent: I have reviewed the patients History and Physical, chart, labs and discussed the procedure including the risks, benefits and  alternatives for the proposed anesthesia with the patient or authorized representative who has indicated his/her understanding  and acceptance.     Dental advisory given  Plan Discussed with: CRNA  Anesthesia Plan Comments:        Anesthesia Quick Evaluation

## 2023-04-13 ENCOUNTER — Encounter (HOSPITAL_COMMUNITY): Payer: Self-pay | Admitting: Family Medicine

## 2023-04-13 LAB — SURGICAL PATHOLOGY

## 2023-04-13 LAB — CULTURE, BETA STREP (GROUP B ONLY)

## 2023-04-13 MED ORDER — IRON SUCROSE 500 MG IVPB - SIMPLE MED
500.0000 mg | Freq: Once | INTRAVENOUS | Status: AC
  Administered 2023-04-13: 500 mg via INTRAVENOUS
  Filled 2023-04-13: qty 275

## 2023-04-13 MED ORDER — OXYCODONE HCL 5 MG PO TABS: 5.0000 mg | ORAL_TABLET | Freq: Four times a day (QID) | ORAL | 0 refills | Status: AC | PRN

## 2023-04-13 MED ORDER — ACETAMINOPHEN 500 MG PO TABS: 1000.0000 mg | ORAL_TABLET | Freq: Three times a day (TID) | ORAL | 0 refills | Status: AC | PRN

## 2023-04-13 MED ORDER — FUROSEMIDE 40 MG PO TABS: 40.0000 mg | ORAL_TABLET | Freq: Every day | ORAL | 0 refills | Status: AC

## 2023-04-13 MED ORDER — INFLUENZA VIRUS VACC SPLIT PF (FLUZONE) 0.5 ML IM SUSY
0.5000 mL | PREFILLED_SYRINGE | INTRAMUSCULAR | Status: AC
  Administered 2023-04-13: 0.5 mL via INTRAMUSCULAR
  Filled 2023-04-13: qty 0.5

## 2023-04-13 MED ORDER — SENNOSIDES-DOCUSATE SODIUM 8.6-50 MG PO TABS: 2.0000 | ORAL_TABLET | Freq: Every evening | ORAL | 0 refills | Status: AC | PRN

## 2023-04-13 MED ORDER — IBUPROFEN 800 MG PO TABS: 800.0000 mg | ORAL_TABLET | Freq: Three times a day (TID) | ORAL | 0 refills | Status: AC | PRN

## 2023-04-13 NOTE — Anesthesia Postprocedure Evaluation (Signed)
Anesthesia Post Note  Patient: Tracy [name]  Procedure(s) Performed: POST PARTUM TUBAL LIGATION (Bilateral)     Patient location during evaluation: PACU Anesthesia Type: Epidural Level of consciousness: oriented and awake and alert Pain management: pain level controlled Vital Signs Assessment: post-procedure vital signs reviewed and stable Respiratory status: spontaneous breathing, respiratory function stable and patient connected to nasal cannula oxygen Cardiovascular status: blood pressure returned to baseline and stable Postop Assessment: no headache, no backache and no apparent nausea or vomiting Anesthetic complications: no  No notable events documented.  Last Vitals:  Vitals:   04/12/23 2114 04/13/23 0533  BP: (!) 138/92 132/89  Pulse: 81 85  Resp: 18 18  Temp: 37 C 36.7 C  SpO2: 100%     Last Pain:  Vitals:   04/13/23 0533  TempSrc: Oral  PainSc: 0-No pain                 Lasundra Hascall L Murriel Holwerda

## 2023-04-14 ENCOUNTER — Ambulatory Visit (HOSPITAL_COMMUNITY): Payer: Self-pay

## 2023-04-14 LAB — CULTURE, BETA STREP (GROUP B ONLY): Strep Gp B Culture: NEGATIVE

## 2023-04-14 NOTE — Lactation Note (Signed)
This note was copied from a baby's chart. Lactation Consultation Note  Patient Name: Tracy [name] Date: 04/14/2023 Age:37 hours  Reason for consult: Follow-up assessment;Difficult latch;Late-preterm 34-36.6wks;Breastfeeding assistance  P4, [redacted]w[redacted]d, ([redacted]w[redacted]d today). PTI, 4% weight loss  Mother's milk is coming in and breast are filling. She states that in the past, she has had a low milk supply. Baby is bottle feeding with formula and occasionally mother puts baby to breast. Mother plans to breast and formula feed.   Mother receptive to latching and basic breastfeeding education provided in football hold on left breast. Baby latches well. Mother's nipples are slightly inverted and with latch, are erect but tender. Mother grimaces when baby latches and then the discomfort subsides. Nipple Shield, 24 mm, was the best fit and baby was able to latch. Baby latches best to directly to mother and mother preferring direct latch over NS.  Mother prefers to contact the DME company for an electric pump and not submit to [facility] due to her preference to get formula for her baby. She has a hand pump and baby is able to latch.  Mom made aware of O/P services, breastfeeding support groups, and our phone # for post-discharge questions.     Maternal Data Has patient been taught Hand Expression?: Yes  Feeding Mother's Current Feeding Choice: Breast Milk and Formula Nipple Type: Slow - flow  LATCH Score Latch: Repeated attempts needed to sustain latch, nipple held in mouth throughout feeding, stimulation needed to elicit sucking reflex.  Audible Swallowing: A few with stimulation  Type of Nipple: Flat  Comfort (Breast/Nipple): Filling, red/small blisters or bruises, mild/mod discomfort  Hold (Positioning): Assistance needed to correctly position infant at breast and maintain latch.  LATCH Score: 5   Lactation Tools Discussed/Used Tools: Pump;Flanges;Nipple Shields Nipple shield  size: 24 Flange Size: 24 Breast pump type: Manual Reason for Pumping: LPI Pumping frequency: mother states hand pump is uncomfortable  Interventions Interventions: Breast feeding basics reviewed;Assisted with latch;Hand express;Breast compression;Support pillows;Position options;Coconut oil;Hand pump;Education  Discharge Discharge Education: Engorgement and breast care;Warning signs for feeding baby Pump: Manual (provided mother with resource to request personal pump) WIC Program: Yes  Consult Status Consult Status: Complete    Omar Person 04/14/2023, 10:22 AM

## 2023-04-14 NOTE — Lactation Note (Signed)
This note was copied from a baby's chart. Lactation Consultation Note  Patient Name: Girl Glenette Hosaka ZOXWR'U Date: 04/14/2023 Age:37 hours    Baby's bilirubin has increased and infant is staying an additional day.  Bili level is not requiring phototherapy at this time. Due to continued hospitalization for baby, mother was offered to start pumping. Instructed mother on the use, frequency, cleaning of breast pump and storage of breast milk. Mother expressed 5 ml of colostrum. Instructed mother to pump every 3 hours for 15 min. In the initiation setting. Mother will add her EBM to infant's next feeding.  Mother was eligible for a stork pump and a Spectra pump was taken to mom's room. Instructed mother to continue to pump 8 times/ 24 hours to establish and maintain her milk supply. Mother very pleased to get an electric pump to take home.        Christella Hartigan M 04/14/2023, 4:58 PM
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
## 2023-04-15 ENCOUNTER — Inpatient Hospital Stay (HOSPITAL_COMMUNITY)
Admission: RE | Admit: 2023-04-15 | Payer: Medicaid Other | Source: Home / Self Care | Admitting: Obstetrics & Gynecology

## 2023-04-15 ENCOUNTER — Ambulatory Visit (HOSPITAL_COMMUNITY): Payer: Self-pay

## 2023-04-15 ENCOUNTER — Inpatient Hospital Stay (HOSPITAL_COMMUNITY): Payer: Medicaid Other

## 2023-04-15 NOTE — Lactation Note (Signed)
This note was copied from a baby's chart. Lactation Consultation Note  Patient Name: Tracy [name] ZOXWR'U Date: 04/15/2023 Age:37 days  Reason for consult: Late-preterm 34-36.6wks;Follow-up assessment;Hyperbilirubinemia  P4, [redacted]w[redacted]d (today), jaundice, LPI  Mother's milk is coming to volume. Breast are firm and she is pumping. Last volume was 10 ml. She states baby is latching and making progress. Baby is also getting formula supplements.   Mother has a breast pump for home. Discharge is pending baby's bilirubin and evaluation. Denies nay other questions or concerns.   Feeding Mother's Current Feeding Choice: Breast Milk and Formula Nipple Type: Slow - flow    Lactation Tools Discussed/Used Tools: Pump;Flanges Flange Size: 27 Breast pump type: Double-Electric Breast Pump Reason for Pumping: supplement, breast fullness Pumped volume: 10 mL  Interventions Interventions: Education  Discharge Discharge Education: Engorgement and breast care;Warning signs for feeding baby  Consult Status Consult Status: Complete Date: 04/15/23    Omar Person 04/15/2023, 10:36 AM

## 2023-04-16 LAB — SURGICAL PATHOLOGY

## 2023-04-16 NOTE — Telephone Encounter (Signed)
ERROR

## 2023-04-17 ENCOUNTER — Encounter: Payer: Medicaid Other | Admitting: Obstetrics and Gynecology

## 2023-04-18 ENCOUNTER — Ambulatory Visit: Payer: Medicaid Other

## 2023-04-24 ENCOUNTER — Encounter: Payer: Medicaid Other | Admitting: Obstetrics & Gynecology

## 2023-05-01 ENCOUNTER — Encounter: Payer: Medicaid Other | Admitting: Obstetrics and Gynecology

## 2023-05-08 ENCOUNTER — Encounter: Payer: Medicaid Other | Admitting: Obstetrics & Gynecology

## 2023-05-10 ENCOUNTER — Telehealth (HOSPITAL_COMMUNITY): Payer: Self-pay | Admitting: *Deleted

## 2023-05-10 NOTE — Telephone Encounter (Signed)
05/10/2023  Name: Tracy [name] MRN: [id number] DOB: [birth year]  Reason for Call:  Transition of Care Hospital Discharge Call  Contact Status: Patient Contact Status: Message  Language assistant needed:          Follow-Up Questions:    Inocente Salles Postnatal Depression Scale:  In the Past 7 Days:    PHQ2-9 Depression Scale:     Discharge Follow-up:    Post-discharge interventions: NA  Maudry Diego, RN 05/10/2023 1510

## 2023-05-23 ENCOUNTER — Ambulatory Visit: Payer: Medicaid Other | Admitting: Obstetrics and Gynecology

## 2023-05-23 ENCOUNTER — Other Ambulatory Visit: Payer: Medicaid Other

## 2023-05-30 ENCOUNTER — Telehealth: Payer: Self-pay | Admitting: Obstetrics and Gynecology

## 2023-08-06 ENCOUNTER — Encounter (HOSPITAL_COMMUNITY): Payer: Self-pay | Admitting: Family Medicine

## 2024-04-29 ENCOUNTER — Emergency Department (HOSPITAL_COMMUNITY)
Admission: EM | Admit: 2024-04-29 | Discharge: 2024-04-30 | Disposition: A | Discharge status: Home / Self Care | Attending: Student in an Organized Health Care Education/Training Program | Admitting: Student in an Organized Health Care Education/Training Program

## 2024-04-29 ENCOUNTER — Emergency Department (HOSPITAL_COMMUNITY)

## 2024-04-29 DIAGNOSIS — K760 Fatty (change of) liver, not elsewhere classified: Secondary | ICD-10-CM | POA: Insufficient documentation

## 2024-04-29 DIAGNOSIS — R1033 Periumbilical pain: Secondary | ICD-10-CM

## 2024-04-29 DIAGNOSIS — R109 Unspecified abdominal pain: Secondary | ICD-10-CM | POA: Diagnosis present

## 2024-04-29 DIAGNOSIS — K429 Umbilical hernia without obstruction or gangrene: Secondary | ICD-10-CM | POA: Diagnosis not present

## 2024-04-29 LAB — URINALYSIS, W/ REFLEX TO CULTURE (INFECTION SUSPECTED)
Bacteria, UA: NONE SEEN
Bilirubin Urine: NEGATIVE
Glucose, UA: NEGATIVE mg/dL
Hgb urine dipstick: NEGATIVE
Ketones, ur: NEGATIVE mg/dL
Nitrite: NEGATIVE
Protein, ur: NEGATIVE mg/dL
Specific Gravity, Urine: >1.046 — ABNORMAL HIGH (ref 1.005–1.030)
pH: 6 (ref 5.0–8.0)

## 2024-04-29 LAB — CBC WITH DIFFERENTIAL/PLATELET
Abs Immature Granulocytes: 0.08 K/uL — ABNORMAL HIGH (ref 0.00–0.07)
Basophils Absolute: 0 K/uL (ref 0.0–0.1)
Basophils Relative: 0 %
Eosinophils Absolute: 0.3 K/uL (ref 0.0–0.5)
Eosinophils Relative: 2 %
HCT: 42.8 % (ref 36.0–46.0)
Hemoglobin: 13.6 g/dL (ref 12.0–15.0)
Immature Granulocytes: 1 %
Lymphocytes Relative: 24 %
Lymphs Abs: 3 K/uL (ref 0.7–4.0)
MCH: 26.3 pg (ref 26.0–34.0)
MCHC: 31.8 g/dL (ref 30.0–36.0)
MCV: 82.6 fL (ref 80.0–100.0)
Monocytes Absolute: 1 K/uL (ref 0.1–1.0)
Monocytes Relative: 8 %
Neutro Abs: 7.8 K/uL — ABNORMAL HIGH (ref 1.7–7.7)
Neutrophils Relative %: 65 %
Platelets: 287 K/uL (ref 150–400)
RBC: 5.18 MIL/uL — ABNORMAL HIGH (ref 3.87–5.11)
RDW: 14 % (ref 11.5–15.5)
WBC: 12.1 K/uL — ABNORMAL HIGH (ref 4.0–10.5)
nRBC: 0 % (ref 0.0–0.2)

## 2024-04-29 LAB — COMPREHENSIVE METABOLIC PANEL WITH GFR
ALT: 55 U/L — ABNORMAL HIGH (ref 0–44)
AST: 42 U/L — ABNORMAL HIGH (ref 15–41)
Albumin: 3.4 g/dL — ABNORMAL LOW (ref 3.5–5.0)
Alkaline Phosphatase: 91 U/L (ref 38–126)
Anion gap: 11 (ref 5–15)
BUN: 11 mg/dL (ref 6–20)
CO2: 22 mmol/L (ref 22–32)
Calcium: 9.1 mg/dL (ref 8.9–10.3)
Chloride: 103 mmol/L (ref 98–111)
Creatinine, Ser: 0.67 mg/dL (ref 0.44–1.00)
GFR, Estimated: >60 mL/min (ref >60)
Glucose, Bld: 172 mg/dL — ABNORMAL HIGH (ref 70–99)
Potassium: 4.2 mmol/L (ref 3.5–5.1)
Sodium: 136 mmol/L (ref 135–145)
Total Bilirubin: 0.3 mg/dL (ref 0.0–1.2)
Total Protein: 6.9 g/dL (ref 6.5–8.1)

## 2024-04-29 LAB — HCG, SERUM, QUALITATIVE: Preg, Serum: NEGATIVE

## 2024-04-29 MED ORDER — MORPHINE SULFATE (PF) 4 MG/ML IV SOLN
4.0000 mg | Freq: Once | INTRAVENOUS | Status: AC
  Administered 2024-04-29: 4 mg via INTRAVENOUS
  Filled 2024-04-29: qty 1

## 2024-04-29 MED ORDER — ONDANSETRON 4 MG PO TBDP: 4.0000 mg | ORAL_TABLET | Freq: Three times a day (TID) | ORAL | 0 refills | Status: AC | PRN

## 2024-04-29 MED ORDER — KETOROLAC TROMETHAMINE 15 MG/ML IJ SOLN
15.0000 mg | Freq: Once | INTRAMUSCULAR | Status: AC
  Administered 2024-04-29: 15 mg via INTRAVENOUS
  Filled 2024-04-29: qty 1

## 2024-04-29 MED ORDER — NAPROXEN 500 MG PO TABS
500.0000 mg | ORAL_TABLET | Freq: Two times a day (BID) | ORAL | 0 refills | Status: AC
Start: 2024-04-29 — End: ?

## 2024-04-29 MED ORDER — ONDANSETRON HCL 4 MG/2ML IJ SOLN
4.0000 mg | Freq: Once | INTRAMUSCULAR | Status: AC
  Administered 2024-04-29: 4 mg via INTRAVENOUS
  Filled 2024-04-29: qty 2

## 2024-04-29 MED ORDER — IOHEXOL 350 MG/ML SOLN
75.0000 mL | Freq: Once | INTRAVENOUS | Status: AC | PRN
  Administered 2024-04-29: 75 mL via INTRAVENOUS

## 2024-04-29 MED ORDER — SODIUM CHLORIDE 0.9 % IV BOLUS
1000.0000 mL | Freq: Once | INTRAVENOUS | Status: AC
  Administered 2024-04-29: 1000 mL via INTRAVENOUS

## 2024-04-29 MED ORDER — DICYCLOMINE HCL 20 MG PO TABS
20.0000 mg | ORAL_TABLET | Freq: Two times a day (BID) | ORAL | 0 refills | Status: AC
Start: 2024-04-29 — End: ?

## 2024-04-29 NOTE — Discharge Instructions (Addendum)
 It was a pleasure taking care of you today  As we discussed you have an umbilical hernia which contains fat.   Take the medications as prescribed  Return for new or worsening symptoms

## 2024-04-29 NOTE — ED Provider Notes (Signed)
 [place] [facility] Provider Note   CSN: [id number] Arrival date & time: 04/29/24  2101     Patient presents with: Abdominal Pain (Sudden severe Abdominal pain and lower back pain bilateral quadrant, states started with her trying to urinate and feels sharp and like it moving side to side)   Tracy [name] is a 38 y.o. female here for evaluation of abdominal pain.  Located to periumbilical region.  Will radiate into back.  She has no focal right lower quadrant or left lower quadrant pain.  Describes pain as sharp, aching.  Worse with movement and when trying to urinate.  She denies any urinary frequency, urgency or hematuria.  Denies chance of pregnancy.  States last month she did have 2 menstrual cycles which is atypical for her.  She has had nausea without vomiting.  No fever, chest pain, shortness of breath. No concern for STI, no vag dc.  Last bowel movement yesterday.  Passing flatus.   HPI     Prior to Admission medications   Medication Sig Start Date End Date Taking? Authorizing Provider  dicyclomine (BENTYL) 20 MG tablet Take 1 tablet (20 mg total) by mouth 2 (two) times daily. 04/29/24  Yes Chelsye Suhre A, PA-C  naproxen (NAPROSYN) 500 MG tablet Take 1 tablet (500 mg total) by mouth 2 (two) times daily. 04/29/24  Yes Teofilo Lupinacci A, PA-C  ondansetron  (ZOFRAN -ODT) 4 MG disintegrating tablet Take 1 tablet (4 mg total) by mouth every 8 (eight) hours as needed. 04/29/24  Yes Lougenia Morrissey A, PA-C  acetaminophen  (TYLENOL ) 500 MG tablet Take 2 tablets (1,000 mg total) by mouth every 8 (eight) hours as needed for moderate pain (for pain scale < 4). 04/13/23   Nicholaus Almarie HERO, MD  ferrous sulfate  325 (65 FE) MG EC tablet Take 1 tablet (325 mg total) by mouth every other day. 02/26/23   Constant, Peggy, MD  furosemide  (LASIX ) 40 MG tablet Take 1 tablet (40 mg total) by mouth daily for 3 days. 04/13/23 04/16/23  Nicholaus Almarie HERO, MD   ibuprofen  (ADVIL ) 800 MG tablet Take 1 tablet (800 mg total) by mouth every 8 (eight) hours as needed for moderate pain. 04/13/23   Nicholaus Almarie HERO, MD  Prenatal Vit-Fe Phos-FA-Omega (VITAFOL  GUMMIES) 3.33-0.333-34.8 MG CHEW Chew 3 tablets by mouth daily in the afternoon. 10/17/22   Constant, Peggy, MD  senna-docusate (SENOKOT-S) 8.6-50 MG tablet Take 2 tablets by mouth at bedtime as needed for mild constipation. 04/13/23   Nicholaus Almarie HERO, MD    Allergies: Patient has no known allergies.    Review of Systems  Constitutional: Negative.   HENT: Negative.    Respiratory: Negative.    Cardiovascular: Negative.   Gastrointestinal:  Positive for abdominal pain and nausea. Negative for abdominal distention, anal bleeding, blood in stool, diarrhea, rectal pain and vomiting.  Genitourinary: Negative.   Musculoskeletal: Negative.   Skin: Negative.   Neurological: Negative.   All other systems reviewed and are negative.   Updated Vital Signs BP (!) 138/95   Pulse 90   Temp 97.6 F (36.4 C) (Oral)   Resp (!) 21   Ht 4' 11 (1.499 m)   Wt 77.1 kg   SpO2 94%   BMI 34.34 kg/m   Physical Exam Vitals and nursing note reviewed.  Constitutional:      General: She is not in acute distress.    Appearance: She is well-developed. She is not ill-appearing, toxic-appearing or diaphoretic.  HENT:  Head: Normocephalic and atraumatic.  Eyes:     Pupils: Pupils are equal, round, and reactive to light.  Cardiovascular:     Rate and Rhythm: Normal rate.     Heart sounds: Normal heart sounds.  Pulmonary:     Effort: Pulmonary effort is normal. No respiratory distress.     Breath sounds: Normal breath sounds.  Abdominal:     General: Bowel sounds are normal. There is no distension.     Palpations: Abdomen is soft.     Tenderness: There is generalized abdominal tenderness and tenderness in the periumbilical area. There is no right CVA tenderness, left CVA tenderness, guarding or rebound.  Negative signs include Murphy's sign and McBurney's sign.     Comments: Generalized tenderness, worse to periumbilical region.  No focal right lower quadrant, left lower quadrant pain. Small reducible periumbilical hernia wo overlying erythema, warmth.  Musculoskeletal:        General: Normal range of motion.     Cervical back: Normal range of motion.  Skin:    General: Skin is warm and dry.  Neurological:     General: No focal deficit present.     Mental Status: She is alert.  Psychiatric:        Mood and Affect: Mood normal.     (all labs ordered are listed, but only abnormal results are displayed) Labs Reviewed  URINALYSIS, W/ REFLEX TO CULTURE (INFECTION SUSPECTED) - Abnormal; Notable for the following components:      Result Value   Color, Urine STRAW (*)    APPearance HAZY (*)    Specific Gravity, Urine >1.046 (*)    Leukocytes,Ua TRACE (*)    All other components within normal limits  CBC WITH DIFFERENTIAL/PLATELET - Abnormal; Notable for the following components:   WBC 12.1 (*)    RBC 5.18 (*)    Neutro Abs 7.8 (*)    Abs Immature Granulocytes 0.08 (*)    All other components within normal limits  COMPREHENSIVE METABOLIC PANEL WITH GFR - Abnormal; Notable for the following components:   Glucose, Bld 172 (*)    Albumin 3.4 (*)    AST 42 (*)    ALT 55 (*)    All other components within normal limits  HCG, SERUM, QUALITATIVE  CBC WITH DIFFERENTIAL/PLATELET  LIPASE, BLOOD    EKG: None  Radiology: CT ABDOMEN PELVIS W CONTRAST Result Date: 04/29/2024 CLINICAL DATA:  Abdominal pain. EXAM: CT ABDOMEN AND PELVIS WITH CONTRAST TECHNIQUE: Multidetector CT imaging of the abdomen and pelvis was performed using the standard protocol following bolus administration of intravenous contrast. RADIATION DOSE REDUCTION: This exam was performed according to the departmental dose-optimization program which includes automated exposure control, adjustment of the mA and/or kV according to  patient size and/or use of iterative reconstruction technique. CONTRAST:  75mL OMNIPAQUE IOHEXOL 350 MG/ML SOLN COMPARISON:  None Available. FINDINGS: Lower chest: No acute abnormality. Hepatobiliary: There is diffuse fatty infiltration of the liver parenchyma. No focal liver abnormality is seen. No gallstones, gallbladder wall thickening, or biliary dilatation. Pancreas: Unremarkable. No pancreatic ductal dilatation or surrounding inflammatory changes. Spleen: Normal in size without focal abnormality. Adrenals/Urinary Tract: Adrenal glands are unremarkable. Kidneys are normal, without renal calculi, focal lesion, or hydronephrosis. Bladder is distended and is otherwise unremarkable. Stomach/Bowel: Stomach is within normal limits. Appendix appears normal. No evidence of bowel wall thickening, distention, or inflammatory changes. Vascular/Lymphatic: No significant vascular findings are present. No enlarged abdominal or pelvic lymph nodes. Reproductive: Uterus and bilateral adnexa  are unremarkable. Other: There is a 3.3 cm x 3.2 cm x 3.2 cm fat containing umbilical hernia. No abdominopelvic ascites. Musculoskeletal: No acute or significant osseous findings. IMPRESSION: 1. Hepatic steatosis. 2. Fat containing umbilical hernia. Electronically Signed   By: Suzen Dials M.D.   On: 04/29/2024 23:15     Procedures   Medications Ordered in the ED  sodium chloride  0.9 % bolus 1,000 mL (1,000 mLs Intravenous New Bag/Given 04/29/24 2137)  ondansetron  (ZOFRAN ) injection 4 mg (4 mg Intravenous Given 04/29/24 2138)  morphine (PF) 4 MG/ML injection 4 mg (4 mg Intravenous Given 04/29/24 2139)  iohexol (OMNIPAQUE) 350 MG/ML injection 75 mL (75 mLs Intravenous Contrast Given 04/29/24 2247)  ketorolac  (TORADOL ) 15 MG/ML injection 15 mg (15 mg Intravenous Given 04/29/24 6742)   38 year old here for evaluation of periumbilical abdominal pain.  Started just prior PTA.  Described as aching and sharp.  She has had prior tubal  ligation.  No focal right lower quadrant, left lower quadrant pain.  No hematuria, frequency, urgency, dysuria however does state that her abdominal pain worsened when she tried to urinate.  Will plan on labs, imaging, reassess  Labs and imaging personally viewed and interpreted:  CBC leukocytosis 12.1 CMP mild LFT elevation normal alk phos, T. bili UA neg for infection Preg neg CT AP steatosis small periumbilical fat-containing hernia  Patient reassessed.  Still having some pain, improved.  Plan on follow-up up on urine  Patient reassessed.  Urine without infection.  Workup today reassuring.  Will have her follow-up outpatient, return for any worsening symptoms.  Patient is nontoxic, nonseptic appearing, in no apparent distress.  Patient's pain and other symptoms adequately managed in emergency department.  Fluid bolus given.  Labs, imaging and vitals reviewed.  Patient does not meet the SIRS or Sepsis criteria.  On repeat exam patient does not have a surgical abdomin and there are no peritoneal signs.  No indication of appendicitis, bowel obstruction, bowel perforation, cholecystitis, diverticulitis, PID, intermittent, persistent torsion, TOA, ectopic pregnancy, AAA, dissection, traumatic injury.  Patient discharged home with symptomatic treatment and given strict instructions for follow-up with their primary care physician.  I have also discussed reasons to return immediately to the ER.  Patient expresses understanding and agrees with plan.                                   Medical Decision Making Amount and/or Complexity of Data Reviewed External Data Reviewed: labs, radiology and notes. Labs: ordered. Decision-making details documented in ED Course. Radiology: ordered and independent interpretation performed. Decision-making details documented in ED Course.  Risk OTC drugs. Prescription drug management. Parenteral controlled substances. Decision regarding hospitalization. Diagnosis  or treatment significantly limited by social determinants of health.       Final diagnoses:  Periumbilical abdominal pain  Hepatic steatosis  Umbilical hernia without obstruction and without gangrene    ED Discharge Orders          Ordered    dicyclomine (BENTYL) 20 MG tablet  2 times daily        04/29/24 2351    naproxen (NAPROSYN) 500 MG tablet  2 times daily        04/29/24 2351    ondansetron  (ZOFRAN -ODT) 4 MG disintegrating tablet  Every 8 hours PRN        04/29/24 2351  Raquel Sayres A, PA-C 04/29/24 2353    Lowther, Amy, DO 04/30/24 1609

## 2024-04-30 LAB — LIPASE, BLOOD: Lipase: 36 U/L (ref 11–51)

## 2024-07-15 ENCOUNTER — Emergency Department (HOSPITAL_COMMUNITY)
Admission: EM | Admit: 2024-07-15 | Discharge: 2024-07-15 | Discharge status: Against Medical Advice | Payer: Self-pay | Attending: Emergency Medicine | Admitting: Emergency Medicine

## 2024-07-15 NOTE — ED Notes (Signed)
Called for triage x3 no answer  

## 2024-07-15 NOTE — ED Triage Notes (Signed)
 C/O headache, sinus pressure, whole face numbness. C/O fever. C/O CP and SHOB/ blurred vision 2 days. Axox4.
